# Patient Record
Sex: Female | Born: 1975
Health system: Southern US, Community
[De-identification: ages and names within clinical notes are randomized; demographics above are authoritative.]

## PROBLEM LIST (undated history)

## (undated) ENCOUNTER — Inpatient Hospital Stay (HOSPITAL_COMMUNITY): Payer: Self-pay

## (undated) DIAGNOSIS — J45909 Unspecified asthma, uncomplicated: Secondary | ICD-10-CM

## (undated) DIAGNOSIS — T4145XA Adverse effect of unspecified anesthetic, initial encounter: Secondary | ICD-10-CM

## (undated) DIAGNOSIS — D649 Anemia, unspecified: Secondary | ICD-10-CM

## (undated) DIAGNOSIS — B019 Varicella without complication: Secondary | ICD-10-CM

## (undated) DIAGNOSIS — Z9889 Other specified postprocedural states: Secondary | ICD-10-CM

## (undated) DIAGNOSIS — T8859XA Other complications of anesthesia, initial encounter: Secondary | ICD-10-CM

## (undated) DIAGNOSIS — R112 Nausea with vomiting, unspecified: Secondary | ICD-10-CM

## (undated) HISTORY — DX: Other specified postprocedural states: Z98.890

## (undated) HISTORY — PX: WISDOM TOOTH EXTRACTION: SHX21

## (undated) HISTORY — DX: Adverse effect of unspecified anesthetic, initial encounter: T41.45XA

## (undated) HISTORY — DX: Varicella without complication: B01.9

## (undated) HISTORY — PX: HERNIA REPAIR: SHX51

## (undated) HISTORY — DX: Anemia, unspecified: D64.9

## (undated) HISTORY — DX: Nausea with vomiting, unspecified: R11.2

## (undated) HISTORY — DX: Other complications of anesthesia, initial encounter: T88.59XA

---

## 2001-06-18 HISTORY — PX: OTHER SURGICAL HISTORY: SHX169

## 2006-06-18 HISTORY — PX: LAPAROSCOPIC GASTRIC BANDING: SHX1100

## 2012-05-27 ENCOUNTER — Encounter: Payer: Self-pay | Admitting: Internal Medicine

## 2012-05-27 ENCOUNTER — Ambulatory Visit (INDEPENDENT_AMBULATORY_CARE_PROVIDER_SITE_OTHER): Payer: PRIVATE HEALTH INSURANCE | Admitting: Internal Medicine

## 2012-05-27 VITALS — BP 118/78 | HR 63 | Temp 98.6°F | Resp 12 | Ht 66.5 in | Wt 170.8 lb

## 2012-05-27 DIAGNOSIS — L409 Psoriasis, unspecified: Secondary | ICD-10-CM

## 2012-05-27 DIAGNOSIS — Z131 Encounter for screening for diabetes mellitus: Secondary | ICD-10-CM

## 2012-05-27 DIAGNOSIS — R87619 Unspecified abnormal cytological findings in specimens from cervix uteri: Secondary | ICD-10-CM

## 2012-05-27 DIAGNOSIS — Z13220 Encounter for screening for lipoid disorders: Secondary | ICD-10-CM

## 2012-05-27 DIAGNOSIS — E663 Overweight: Secondary | ICD-10-CM

## 2012-05-27 DIAGNOSIS — Z6825 Body mass index (BMI) 25.0-25.9, adult: Secondary | ICD-10-CM

## 2012-05-27 DIAGNOSIS — L408 Other psoriasis: Secondary | ICD-10-CM

## 2012-05-27 DIAGNOSIS — L989 Disorder of the skin and subcutaneous tissue, unspecified: Secondary | ICD-10-CM

## 2012-05-27 MED ORDER — CALCIPOTRIENE-BETAMETH DIPROP 0.005-0.064 % EX SUSP: Freq: Every day | CUTANEOUS | Status: DC

## 2012-05-27 NOTE — Patient Instructions (Addendum)
This is  my version of a  "Low GI"  Diet:  All of the foods can be found at grocery stores and in bulk at [employer].  The Atkins protein bars and shakes are available in more varieties at [employer], [employer] and [employer].     7 AM Breakfast:  Low carbohydrate Protein  Shakes (I recommend the EAS AdvantEdge "Carb Control" shakes  Or the low carb shakes by Atkins.   Both are available everywhere:  In  cases at BJs  Or in 4 packs at grocery stores and pharmacies  2.5 carbs  (Alternative is  a toasted Arnold's Sandwhich Thin w/ peanut butter, a "Bagel Thin" with cream cheese and salmon) or  a scrambled egg burrito made with a low carb tortilla .  Avoid cereal and bananas, oatmeal too unless you are cooking the old fashioned kind that takes 30-40 minutes to prepare.  the rest is overly processed, has minimal fiber, and is loaded with carbohydrates!   10 AM: Protein bar by Atkins (the snack size, under 200 cal).  There are many varieties , available widely again or in bulk in limited varieties at BJs)  Other so called "protein bars" tend to be loaded with carbohydrates.  Remember, in food advertising, the word "energy" is synonymous for " carbohydrate."  Lunch: sandwich of turkey, (or any lunchmeat, grilled meat or canned tuna), fresh avocado, mayonnaise  and cheese on a lower carbohydrate pita bread, flatbread, or tortilla . Ok to use regular mayonnaise. The bread is the only source or carbohydrate that can be decreased (Joseph's makes a pita bread and a flat bread that are 50 cal and 4 net carbs ; Toufayan makes a low carb flatbread that's 100 cal and 9 net carbs  and  Mission makes a low carb whole wheat tortilla  That is 210 cal and 6 net carbs)  3 PM:  Mid day :  Another protein bar,  Or a  cheese stick (100 cal, 0 carbs),  Or 1 ounce of  almonds, walnuts, pistachios, pecans, peanuts,  Macadamia nuts. Or a Dannon light n Fit greek yogurt, 80 cal 8 net carbs . Avoid "granola"; the dried cranberries and  raisins are loaded with carbohydrates. Mixed nuts ok if no raisins or cranberries or dried fruit.      6 PM  Dinner:  "mean and green:"  Meat/chicken/fish or a high protein legume; , with a green salad, and a low GI  Veggie (broccoli, cauliflower, green beans, spinach, brussel sprouts. Lima beans) : Avoid "Low fat dressings, as well as [place] and [place]! They are loaded with sugar! Instead use ranch, vinagrette,  Blue cheese, etc.  There is a low carb pasta by Dreamfield's available at [employer] that is acceptable and tastes great. Try Michel Angel's chicken piccata over low carb pasta. The chicken dish is 0 carbs, and can be found in frozen section at BJs and [employer]. Also try Aaron Sanchez's "Carnitas" (pulled pork, no sauce,  0 carbs) and his pot roast.   both are in the refrigerated section at BJs   9 PM snack : Breyer's "low carb" fudgsicle or  ice cream bar (Carb Smart line), or  Weight Watcher's ice cream bar , or another "no sugar added" ice cream;a serving of fresh berries/cherries with whipped cream (Avoid bananas, pineapple, grapes  and watermelon on a regular basis because they are high in sugar)   Remember that snack Substitutions should be less than 15 to   20 carbs  Per serving. Remember to subtract fiber grams and sugar alcohols to get the "net carbs."   
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
## 2012-05-28 ENCOUNTER — Encounter: Payer: Self-pay | Admitting: Internal Medicine

## 2012-05-30 ENCOUNTER — Ambulatory Visit: Payer: Self-pay | Admitting: Internal Medicine

## 2012-06-01 ENCOUNTER — Encounter: Payer: Self-pay | Admitting: Internal Medicine

## 2012-06-01 DIAGNOSIS — Z13220 Encounter for screening for lipoid disorders: Secondary | ICD-10-CM | POA: Insufficient documentation

## 2012-06-01 DIAGNOSIS — Z131 Encounter for screening for diabetes mellitus: Secondary | ICD-10-CM | POA: Insufficient documentation

## 2012-06-01 DIAGNOSIS — L989 Disorder of the skin and subcutaneous tissue, unspecified: Secondary | ICD-10-CM | POA: Insufficient documentation

## 2012-06-01 DIAGNOSIS — E6609 Other obesity due to excess calories: Secondary | ICD-10-CM | POA: Insufficient documentation

## 2012-06-01 DIAGNOSIS — R87619 Unspecified abnormal cytological findings in specimens from cervix uteri: Secondary | ICD-10-CM | POA: Insufficient documentation

## 2012-06-01 LAB — HM PAP SMEAR: HM Pap smear: ABNORMAL

## 2012-06-01 NOTE — Assessment & Plan Note (Signed)
total chol 216, trig 69 HDL 81, LDL 121.  ALT 15 TSH 3.41 K 3.7 .

## 2012-06-01 NOTE — Assessment & Plan Note (Signed)
BUN Cr were 6/0.53  On no meds but a  MVI. A1c 5.7

## 2012-06-01 NOTE — Assessment & Plan Note (Signed)
I have addressed  BMI and recommended a low glycemic index diet utilizing smaller more frequent meals to increase metabolism.  I have also recommended that patient start exercising with a goal of 30 minutes of aerobic exercise a minimum of 5 days per week. Screening for lipid disorders, thyroid and diabetes has  been done  °

## 2012-06-01 NOTE — Assessment & Plan Note (Signed)
S/p normal colposcopy .  Repeat PA due in June 2014

## 2012-06-01 NOTE — Assessment & Plan Note (Signed)
AK vs early basal cell.  Referral to dermatology under way

## 2012-06-04 ENCOUNTER — Encounter: Payer: Self-pay | Admitting: Internal Medicine

## 2012-11-18 LAB — HM PAP SMEAR: HM Pap smear: NORMAL

## 2012-12-03 ENCOUNTER — Ambulatory Visit (INDEPENDENT_AMBULATORY_CARE_PROVIDER_SITE_OTHER): Payer: 59 | Admitting: Internal Medicine

## 2012-12-03 ENCOUNTER — Encounter: Payer: Self-pay | Admitting: Internal Medicine

## 2012-12-03 ENCOUNTER — Other Ambulatory Visit (HOSPITAL_COMMUNITY)
Admission: RE | Admit: 2012-12-03 | Discharge: 2012-12-03 | Disposition: A | Discharge status: Home / Self Care | Payer: 59 | Source: Ambulatory Visit | Attending: Internal Medicine | Admitting: Internal Medicine

## 2012-12-03 VITALS — BP 112/78 | HR 84 | Temp 98.4°F | Resp 16 | Wt 177.8 lb

## 2012-12-03 DIAGNOSIS — Z124 Encounter for screening for malignant neoplasm of cervix: Secondary | ICD-10-CM

## 2012-12-03 DIAGNOSIS — Z1151 Encounter for screening for human papillomavirus (HPV): Secondary | ICD-10-CM | POA: Insufficient documentation

## 2012-12-03 DIAGNOSIS — R87619 Unspecified abnormal cytological findings in specimens from cervix uteri: Secondary | ICD-10-CM

## 2012-12-03 DIAGNOSIS — Z01419 Encounter for gynecological examination (general) (routine) without abnormal findings: Secondary | ICD-10-CM | POA: Insufficient documentation

## 2012-12-03 MED ORDER — MUPIROCIN 2 % EX OINT: TOPICAL_OINTMENT | Freq: Three times a day (TID) | CUTANEOUS | Status: DC

## 2012-12-03 NOTE — Progress Notes (Signed)
Patient ID: Alicia Blair, female   DOB: 08/12/1975, 37 y.o.   MRN: [id number]   Patient Active Problem List   Diagnosis Date Noted  . Screening for lipoid disorders 06/01/2012  . Screening for diabetes mellitus 06/01/2012  . Skin lesion of face 06/01/2012  . Abnormal Pap smear of cervix 06/01/2012  . Overweight (BMI 25.0-29.9) 06/01/2012  . Psoriasis 05/27/2012    Subjective:  CC:   Chief Complaint  Patient presents with  . Follow-up    Needs pap    HPI:   Alicia Blair a 37 y.o. female who presents for 6 month follow up on abnormal PAP smear.  She had her  IUD removed in January and periods have been heavy but regular, lasting 7 days total.  She has not been sexually active .  Occasional use of diflucan for vaginitis due to candida.  symptoms of tingling/redness and irritation and itching which always resolve with one to two days of use.     She was referred to GYN for an abnormal PAP,  colposcopy was normal.  She is due for her next PAP smear in June 2014 .  Has one 72 yr old child by vaginal delivery pushed really hard,  Worried about future prolapse so she does Kegel exercises regularly. Baseline mammogram was normal in 2003, done early because of planned breast augmentation which was done without complications  afterward.     Past Medical History  Diagnosis Date  . Anemia   . Chicken pox     Past Surgical History  Procedure Laterality Date  . Appendectomy  1999  . Laparoscopic gastric banding  2008  . Breast augmentation  2003       The following portions of the patient's history were reviewed and updated as appropriate: Allergies, current medications, and problem list.    Review of Systems:   12 Pt  review of systems was negative except those addressed in the HPI,     History   Social History  . Marital Status: Single    Spouse Name: N/A    Number of Children: N/A  . Years of Education: N/A   Occupational History  . Not on file.   Social  History Main Topics  . Smoking status: Never Smoker   . Smokeless tobacco: Not on file  . Alcohol Use: Yes     Comment: social  . Drug Use: No  . Sexually Active: Not Currently   Other Topics Concern  . 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:  BP 112/78  Pulse 84  Temp(Src) 98.4 F (36.9 C) (Oral)  Resp 16  Wt 177 lb 12 oz (80.627 kg)  BMI 28.26 kg/m2  SpO2 99%  LMP 11/25/2012   General Appearance:    Alert, cooperative, no distress, appears stated age  Head:    Normocephalic, without obvious abnormality, atraumatic     Neck:   Supple, symmetrical, trachea midline, no adenopathy;    thyroid:  no enlargement/tenderness/nodules; no carotid   bruit or JVD  Lungs:     Clear to auscultation bilaterally, respirations unlabored   Heart:    Regular rate and rhythm, S1 and S2 normal, no murmur, rub   or gallop  Abdomen:     Soft, non-tender, bowel sounds active all four quadrants,    no masses, no organomegaly  Genitalia:    Pelvic: cervix normal in appearance, external genitalia normal, no  adnexal masses or tenderness, no cervical motion tenderness, rectovaginal septum normal, uterus normal size, shape, and consistency and vagina normal without discharge  Extremities:   Extremities normal, atraumatic, no cyanosis or edema  Pulses:   2+ and symmetric all extremities  Skin:   Skin color, texture, turgor normal, no rashes or lesions          Assessment and Plan:  Abnormal Pap smear of cervix Repeat done today .  PE normal    Updated Medication List Outpatient Encounter Prescriptions as of 12/03/2012  Medication Sig Dispense Refill  . calcipotriene-betamethasone (TACLONEX SCALP) external suspension Apply topically daily.  60 g  1  . Multiple Vitamin (MULTIVITAMIN) tablet Take 1 tablet by mouth daily.      . mupirocin ointment (BACTROBAN) 2 % Apply topically 3 (three) times daily.  22 g  0   No facility-administered encounter  medications on file as of 12/03/2012.     No orders of the defined types were placed in this encounter.    No Follow-up on file.

## 2012-12-03 NOTE — Patient Instructions (Addendum)
We will call you with the results of your PAP smear

## 2012-12-04 ENCOUNTER — Ambulatory Visit: Payer: PRIVATE HEALTH INSURANCE | Admitting: Internal Medicine

## 2012-12-06 ENCOUNTER — Encounter: Payer: Self-pay | Admitting: Internal Medicine

## 2012-12-06 NOTE — Assessment & Plan Note (Signed)
Repeat done today .  PE normal

## 2012-12-09 ENCOUNTER — Encounter: Payer: Self-pay | Admitting: *Deleted

## 2013-02-27 ENCOUNTER — Ambulatory Visit (INDEPENDENT_AMBULATORY_CARE_PROVIDER_SITE_OTHER): Payer: 59 | Admitting: Adult Health

## 2013-02-27 ENCOUNTER — Encounter: Payer: Self-pay | Admitting: Adult Health

## 2013-02-27 VITALS — BP 122/76 | HR 85 | Temp 98.3°F | Resp 12 | Ht 66.5 in | Wt 175.5 lb

## 2013-02-27 DIAGNOSIS — N9089 Other specified noninflammatory disorders of vulva and perineum: Secondary | ICD-10-CM

## 2013-02-27 MED ORDER — NYSTATIN-TRIAMCINOLONE 100000-0.1 UNIT/GM-% EX OINT: TOPICAL_OINTMENT | Freq: Two times a day (BID) | CUTANEOUS | Status: DC

## 2013-02-27 NOTE — Assessment & Plan Note (Signed)
Minimal redness observed on vulva. Sebaceous cysts resolved. There is small amount of discharge that is yellowish in color at the cervix. Culture sent. Vaginal walls normal, pink. Suspect her irritation may be coming from scented and flavored lubricants which are known culprits. Start nystatin-triamcinolone ointment to irritated area twice daily x 7 days. Adjust treatment as needed based on results of culture. If symptoms do not improve she will RTC.

## 2013-02-27 NOTE — Patient Instructions (Addendum)
  Apply nystatin-triamcinolone ointment to affected area  twice daily.  I will call you once we get the results of the culture.

## 2013-02-27 NOTE — Addendum Note (Signed)
Addended by: Montine Circle D on: 02/27/2013 10:29 AM   Modules accepted: Orders

## 2013-02-27 NOTE — Progress Notes (Signed)
  Subjective:    Patient ID: Alicia Blair, female    DOB: 18-Sep-1975, 37 y.o.   MRN: [id number]  HPI  Patient is a pleasant 37 y/o female who presents to clinic with c/o of feeling "uncomfortable" around her vaginal area. She reports redness and irritated inside labia minora. She had a sebaceous cyst approximately 1 week ago which she treated by applying warm compresses and then she notice it came to a head so she popped the cysts. There was white drainage from the cyst when she popped it. She reports this is what her GYN in [place] would do. She has had 2 cysts in the past. She uses condoms but reports has been using them without experiencing these symptoms. She occasionally uses scented, flavored lubrication prior to engaging in sex.  Current Outpatient Prescriptions on File Prior to Visit  Medication Sig Dispense Refill  . calcipotriene-betamethasone (TACLONEX SCALP) external suspension Apply topically daily.  60 g  1  . Multiple Vitamin (MULTIVITAMIN) tablet Take 1 tablet by mouth daily.      . mupirocin ointment (BACTROBAN) 2 % Apply topically 3 (three) times daily.  22 g  0   No current facility-administered medications on file prior to visit.     Review of Systems  Genitourinary: Negative for vaginal bleeding, vaginal discharge, vaginal pain and dyspareunia.       Irritation and discomfort around labia       Objective:   Physical Exam  Constitutional: She appears well-developed and well-nourished. No distress.  Genitourinary:    Vaginal discharge found.      BP 122/76  Pulse 85  Temp(Src) 98.3 F (36.8 C) (Oral)  Resp 12  Ht 5' 6.5" (1.689 m)  Wt 175 lb 8 oz (79.606 kg)  BMI 27.91 kg/m2  SpO2 98%     Assessment & Plan:

## 2013-02-28 LAB — WET PREP BY MOLECULAR PROBE: Candida species: NEGATIVE

## 2013-07-10 ENCOUNTER — Encounter (INDEPENDENT_AMBULATORY_CARE_PROVIDER_SITE_OTHER): Payer: Self-pay

## 2013-07-10 ENCOUNTER — Encounter: Payer: Self-pay | Admitting: Internal Medicine

## 2013-07-10 ENCOUNTER — Ambulatory Visit (INDEPENDENT_AMBULATORY_CARE_PROVIDER_SITE_OTHER): Payer: 59 | Admitting: Internal Medicine

## 2013-07-10 VITALS — BP 108/74 | HR 69 | Temp 98.1°F | Resp 16 | Wt 177.5 lb

## 2013-07-10 DIAGNOSIS — Z87898 Personal history of other specified conditions: Secondary | ICD-10-CM

## 2013-07-10 DIAGNOSIS — R6889 Other general symptoms and signs: Secondary | ICD-10-CM

## 2013-07-10 DIAGNOSIS — L408 Other psoriasis: Secondary | ICD-10-CM

## 2013-07-10 DIAGNOSIS — IMO0002 Reserved for concepts with insufficient information to code with codable children: Secondary | ICD-10-CM

## 2013-07-10 DIAGNOSIS — L409 Psoriasis, unspecified: Secondary | ICD-10-CM

## 2013-07-10 MED ORDER — DIAZEPAM 5 MG PO TABS: 5.0000 mg | ORAL_TABLET | Freq: Four times a day (QID) | ORAL | Status: DC | PRN

## 2013-07-10 MED ORDER — CLOBETASOL PROPIONATE 0.05 % EX SOLN: 1.0000 "application " | Freq: Two times a day (BID) | CUTANEOUS | Status: DC | PRN

## 2013-07-10 MED ORDER — FLUOCINOLONE ACETONIDE 0.01 % OT OIL: 1.0000 "application " | TOPICAL_OIL | Freq: Every day | OTIC | Status: DC | PRN

## 2013-07-10 MED ORDER — ONDANSETRON HCL 4 MG PO TABS: 4.0000 mg | ORAL_TABLET | Freq: Three times a day (TID) | ORAL | Status: DC | PRN

## 2013-07-10 NOTE — Progress Notes (Signed)
Pre-visit discussion using our clinic review tool. No additional management support is needed unless otherwise documented below in the visit note.  

## 2013-07-10 NOTE — Progress Notes (Signed)
Patient ID: Alicia Blair, female   DOB: [birth year], 38 y.o.   MRN: [id number]  Patient Active Problem List   Diagnosis Date Noted  . ASCUS with positive high risk HPV 07/12/2013  . History of motion sickness 07/12/2013  . Irritation of vulva 02/27/2013  . Screening for lipoid disorders 06/01/2012  . Screening for diabetes mellitus 06/01/2012  . Skin lesion of face 06/01/2012  . Overweight (BMI 25.0-29.9) 06/01/2012  . Psoriasis 05/27/2012    Subjective:  CC:   Chief Complaint  Patient presents with  . Follow-up    medication    HPI:   Alicia Blair a 38 y.o. female who presentsfor f ollowup on chronic conditions including Psoriasis on scalp.  She is having a flare of psoriasis on scalp and requesting refills on the lotions that she has used in the past provided by prior dermatologic consult.   She is flying to [place] next months  and has a history of recurrent motion sickness during flights. Prior trials of scopolamine patches have made her symptoms worse. She has had good results with valium and phenergan and is requesting refills on those today. She has never tried Zofran and is interested in using something that is less sedating than phenergan since  she wants to enjoy her brief vacation with a cocktail or she gets there.    She's also inquiring about when her  next PAP smear is due.  She has a history of a ASCUS on routien PAP smear Oct 2013, with HPV high-risk positive at that time. underwent colposcopy December 2013 which was benign. Repeat Pap smear by me in June  2014 was normal and the HPV screen was negative.   Past Medical History  Diagnosis Date  . Anemia   . Chicken pox     Past Surgical History  Procedure Laterality Date  . Appendectomy  1999  . Laparoscopic gastric banding  2008  . Breast augmentation  2003       The following portions of the patient's history were reviewed and updated as appropriate: Allergies, current medications, and problem  list.    Review of Systems:   12 Pt  review of systems was negative except those addressed in the HPI,     History   Social History  . Marital Status: Single    Spouse Name: N/A    Number of Children: N/A  . Years of Education: N/A   Occupational History  . Not on file.   Social History Main Topics  . Smoking status: Never Smoker   . Smokeless tobacco: Not on file  . Alcohol Use: Yes     Comment: social  . Drug Use: No  . Sexual Activity: Not Currently   Other Topics Concern  . 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 ROM normal. No CVA tenderness. Lungs: clear to auscultation bilaterally Heart: regular rate and rhythm, S1, S2 normal, no murmur, click, rub or gallop Abdomen: soft, non-tender; bowel sounds normal; no masses,  no organomegaly Pulses: 2+ and symmetric Skin: Skin color, texture, turgor normal. No rashes or lesions Lymph nodes: Cervical, supraclavicular, and axillary nodes normal.  Assessment and Plan:  ASCUS with positive high risk HPV Endocervical biopsy was negative/benign December 2013. Repeat Pap smear and HPV screen was normal June 2014 we'll continue annual Pap smear with HPV screen for 3 years and then resume 3 year intervals if continually normal   Psoriasis Refills on her anti-psoriasis lotions and creams were given to her today.  History of motion sickness She was given a prescription for Vicodin and Zofran to use her air travel.  A total of 25 minutes  of face to face time was spent with patient more than half of which was spent in counselling and coordination of care   Updated Medication List Outpatient Encounter Prescriptions as of 07/10/2013  Medication Sig  . calcipotriene-betamethasone (TACLONEX SCALP) external suspension Apply topically daily.  . clobetasol (TEMOVATE) 0.05 % external solution Apply 1 application topically 2 (two) times daily as needed.  . Fluocinolone Acetonide 0.01 % OIL Apply 1 application topically daily as needed.  . Multiple Vitamin (MULTIVITAMIN) tablet Take 1 tablet by mouth daily.  . mupirocin ointment (BACTROBAN) 2 % Apply topically 3 (three) times daily.  Marland Kitchen nystatin-triamcinolone ointment (MYCOLOG) Apply topically 2 (two) times daily.  . [DISCONTINUED] clobetasol (TEMOVATE) 0.05 % external solution Apply 1 application topically 2 (two) times daily as needed.  . [DISCONTINUED] Fluocinolone Acetonide 0.01 % OIL Apply 1 application topically daily as needed.  . diazepam (VALIUM) 5 MG tablet Take 1 tablet (5 mg total) by mouth every 6 (six) hours as needed. For air sickness  . ondansetron (ZOFRAN) 4 MG tablet Take 1 tablet (4 mg total) by mouth every 8 (eight) hours as needed for nausea or vomiting.  . [DISCONTINUED] diazepam (VALIUM) 5 MG tablet Take 1 tablet (5 mg total) by mouth every 6 (six) hours as needed for anxiety.     No orders of the defined types were placed in this encounter.    No Follow-up on file.

## 2013-07-10 NOTE — Patient Instructions (Signed)
Refills on the valium for air sickness  Try zofran instead of phenergan unless $$$$$  I will research the data on repeat testing frrequency for PAP smear and let you know

## 2013-07-12 ENCOUNTER — Telehealth: Payer: Self-pay | Admitting: Internal Medicine

## 2013-07-12 DIAGNOSIS — IMO0002 Reserved for concepts with insufficient information to code with codable children: Secondary | ICD-10-CM | POA: Insufficient documentation

## 2013-07-12 DIAGNOSIS — Z87898 Personal history of other specified conditions: Secondary | ICD-10-CM | POA: Insufficient documentation

## 2013-07-12 NOTE — Telephone Encounter (Signed)
She had inquired during her visit when her next Pap smear is due. She is due in June 2015.

## 2013-07-12 NOTE — Assessment & Plan Note (Signed)
Refills on her anti-psoriasis lotions and creams were given to her today.

## 2013-07-12 NOTE — Assessment & Plan Note (Signed)
She was given a prescription for Vicodin and Zofran to use her air travel.

## 2013-07-12 NOTE — Assessment & Plan Note (Signed)
Endocervical biopsy was negative/benign December 2013. Repeat Pap smear and HPV screen was normal June 2014 we'll continue annual Pap smear with HPV screen for 3 years and then resume 3 year intervals if continually normal

## 2013-07-14 NOTE — Telephone Encounter (Signed)
Left message for pt to call back  °

## 2013-07-17 NOTE — Telephone Encounter (Signed)
Left message on voicemail to call office. Pap smear is due June 2015.

## 2013-12-11 ENCOUNTER — Encounter: Payer: 59 | Admitting: Internal Medicine

## 2014-01-18 ENCOUNTER — Ambulatory Visit (INDEPENDENT_AMBULATORY_CARE_PROVIDER_SITE_OTHER): Payer: 59 | Admitting: Internal Medicine

## 2014-01-18 ENCOUNTER — Encounter: Payer: Self-pay | Admitting: Internal Medicine

## 2014-01-18 ENCOUNTER — Other Ambulatory Visit (HOSPITAL_COMMUNITY)
Admission: RE | Admit: 2014-01-18 | Discharge: 2014-01-18 | Disposition: A | Discharge status: Home / Self Care | Payer: 59 | Source: Ambulatory Visit | Attending: Internal Medicine | Admitting: Internal Medicine

## 2014-01-18 VITALS — BP 120/88 | HR 87 | Temp 98.9°F | Resp 16 | Ht 66.5 in | Wt 169.5 lb

## 2014-01-18 DIAGNOSIS — Z1151 Encounter for screening for human papillomavirus (HPV): Secondary | ICD-10-CM | POA: Insufficient documentation

## 2014-01-18 DIAGNOSIS — Z124 Encounter for screening for malignant neoplasm of cervix: Secondary | ICD-10-CM

## 2014-01-18 DIAGNOSIS — W461XXA Contact with contaminated hypodermic needle, initial encounter: Secondary | ICD-10-CM | POA: Insufficient documentation

## 2014-01-18 DIAGNOSIS — Z01419 Encounter for gynecological examination (general) (routine) without abnormal findings: Secondary | ICD-10-CM | POA: Insufficient documentation

## 2014-01-18 DIAGNOSIS — Z7729 Contact with and (suspected ) exposure to other hazardous substances: Secondary | ICD-10-CM

## 2014-01-18 DIAGNOSIS — R6889 Other general symptoms and signs: Secondary | ICD-10-CM

## 2014-01-18 DIAGNOSIS — N920 Excessive and frequent menstruation with regular cycle: Secondary | ICD-10-CM

## 2014-01-18 DIAGNOSIS — E663 Overweight: Secondary | ICD-10-CM

## 2014-01-18 DIAGNOSIS — W278XXA Contact with other nonpowered hand tool, initial encounter: Secondary | ICD-10-CM

## 2014-01-18 DIAGNOSIS — IMO0002 Reserved for concepts with insufficient information to code with codable children: Secondary | ICD-10-CM

## 2014-01-18 DIAGNOSIS — Z1239 Encounter for other screening for malignant neoplasm of breast: Secondary | ICD-10-CM

## 2014-01-18 DIAGNOSIS — Z0000 Encounter for general adult medical examination without abnormal findings: Secondary | ICD-10-CM | POA: Insufficient documentation

## 2014-01-18 NOTE — Assessment & Plan Note (Signed)
I have congratulated her in reduction of   BMI and encouraged  Continued weight loss with goal of 10% of body weigh over the next 6 months using a low glycemic index diet and regular exercise a minimum of 5 days per week.

## 2014-01-18 NOTE — Patient Instructions (Signed)
Congratulations on the weight loss!   You look fantastic  I recommend getting the majority of your calcium and Vitamin D  through diet rather than supplements given the recent association of calcium supplements with increased coronary artery calcium scores (You need 1200 mg daily )   Unsweetened almond/coconut milk is a great low calorie low carb, cholesterol free  way to increase your dietary calcium and vitamin D.  Try the blue Jackquline Bosch  Baseline screening mammogram to be ordered at [facility]

## 2014-01-18 NOTE — Assessment & Plan Note (Signed)
Repeat testing discussed.

## 2014-01-18 NOTE — Assessment & Plan Note (Signed)
Annual comprehensive exam was done including breast, pelvic and PAP smear. All screenings have been addressed .

## 2014-01-18 NOTE — Assessment & Plan Note (Signed)
PAP smear was done today

## 2014-01-18 NOTE — Assessment & Plan Note (Signed)
checking thyroid and CBC today

## 2014-01-18 NOTE — Progress Notes (Signed)
Subjective:     Alicia Blair is a 38 y.o. female and is here for a comprehensive physical exam. The patient reports no problems.  History   Social History  . Marital Status: Single    Spouse Name: N/A    Number of Children: N/A  . Years of Education: N/A   Occupational History  . Not on file.   Social History Main Topics  . Smoking status: Never Smoker   . Smokeless tobacco: Not on file  . Alcohol Use: 1.8 oz/week    3 Cans of beer per week     Comment: social  . Drug Use: No  . Sexual Activity: Not Currently   Other Topics Concern  . 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   Health Maintenance  Topic Date Due  . Tetanus/tdap  12/31/1994  . Influenza Vaccine  01/16/2014  . Pap Smear  12/04/2015    The following portions of the patient's history were reviewed and updated as appropriate: allergies, current medications, past family history, past medical history, past social history, past surgical history and problem list.  Review of Systems A comprehensive review of systems was negative.   Objective:   BP 120/88  Pulse 87  Temp(Src) 98.9 F (37.2 C) (Oral)  Resp 16  Ht 5' 6.5" (1.689 m)  Wt 169 lb 8 oz (76.885 kg)  BMI 26.95 kg/m2  SpO2 98%  LMP 12/28/2013   General Appearance:    Alert, cooperative, no distress, appears stated age  Head:    Normocephalic, without obvious abnormality, atraumatic  Eyes:    PERRL, conjunctiva/corneas clear, EOM's intact, fundi    benign, both eyes  Ears:    Normal TM's and external ear canals, both ears  Nose:   Nares normal, septum midline, mucosa normal, no drainage    or sinus tenderness  Throat:   Lips, mucosa, and tongue normal; teeth and gums normal  Neck:   Supple, symmetrical, trachea midline, no adenopathy;    thyroid:  no enlargement/tenderness/nodules; no carotid   bruit or JVD  Back:     Symmetric, no curvature, ROM normal, no CVA tenderness  Lungs:     Clear to auscultation  bilaterally, respirations unlabored  Chest Wall:    No tenderness or deformity   Heart:    Regular rate and rhythm, S1 and S2 normal, no murmur, rub   or gallop  Breast Exam:    Saline Implants, No tenderness, masses, or nipple abnormality  Abdomen:     Soft, non-tender, bowel sounds active all four quadrants,    no masses, no organomegaly  Genitalia:    Pelvic: cervix normal in appearance, external genitalia normal, no adnexal masses or tenderness, no cervical motion tenderness, rectovaginal septum normal, uterus normal size, shape, and consistency and vagina normal without discharge  Extremities:   Extremities normal, atraumatic, no cyanosis or edema  Pulses:   2+ and symmetric all extremities  Skin:   Skin color, texture, turgor normal, no rashes or lesions  Lymph nodes:   Cervical, supraclavicular, and axillary nodes normal  Neurologic:   CNII-XII intact, normal strength, sensation and reflexes    throughout    Assessment and Plan:   Overweight (BMI 25.0-29.9) I have congratulated her in reduction of   BMI and encouraged  Continued weight loss with goal of 10% of body weigh over the next 6 months using a low glycemic index diet and regular exercise a  minimum of 5 days per week.    ASCUS with positive high risk HPV PAP smear was done today  Needlestick injury accident with exposure to body fluid Repeat testing discussed.   Menorrhagia checking thyroid and CBC today   Routine general medical examination at a health care facility Annual comprehensive exam was done including breast, pelvic and PAP smear. All screenings have been addressed .    Updated Medication List Outpatient Encounter Prescriptions as of 01/18/2014  Medication Sig  . calcipotriene-betamethasone (TACLONEX SCALP) external suspension Apply topically daily.  . clobetasol (TEMOVATE) 0.05 % external solution Apply 1 application topically 2 (two) times daily as needed.  . Fluocinolone Acetonide 0.01 % OIL Apply 1  application topically daily as needed.  . Multiple Vitamin (MULTIVITAMIN) tablet Take 1 tablet by mouth daily.  . mupirocin ointment (BACTROBAN) 2 % Apply topically 3 (three) times daily.  Marland Kitchen nystatin-triamcinolone ointment (MYCOLOG) Apply topically 2 (two) times daily.  . diazepam (VALIUM) 5 MG tablet Take 1 tablet (5 mg total) by mouth every 6 (six) hours as needed. For air sickness  . ondansetron (ZOFRAN) 4 MG tablet Take 1 tablet (4 mg total) by mouth every 8 (eight) hours as needed for nausea or vomiting.

## 2014-01-18 NOTE — Progress Notes (Signed)
Pre-visit discussion using our clinic review tool. No additional management support is needed unless otherwise documented below in the visit note.  

## 2014-01-20 LAB — CYTOLOGY - PAP

## 2014-01-21 ENCOUNTER — Encounter: Payer: Self-pay | Admitting: *Deleted

## 2014-02-02 ENCOUNTER — Telehealth: Payer: Self-pay | Admitting: Internal Medicine

## 2014-02-02 DIAGNOSIS — L409 Psoriasis, unspecified: Secondary | ICD-10-CM

## 2014-02-02 MED ORDER — FLUOCINOLONE ACETONIDE 0.01 % OT OIL: 1.0000 "application " | TOPICAL_OIL | Freq: Every day | OTIC | Status: DC | PRN

## 2014-02-02 MED ORDER — CLOBETASOL PROPIONATE 0.05 % EX SOLN: 1.0000 "application " | Freq: Two times a day (BID) | CUTANEOUS | Status: DC | PRN

## 2014-02-02 MED ORDER — MUPIROCIN 2 % EX OINT: TOPICAL_OINTMENT | Freq: Three times a day (TID) | CUTANEOUS | Status: DC

## 2014-02-02 MED ORDER — CALCIPOTRIENE-BETAMETH DIPROP 0.005-0.064 % EX SUSP: Freq: Every day | CUTANEOUS | Status: DC

## 2014-02-02 NOTE — Addendum Note (Signed)
Addended by: Nanci Pina on: 02/02/2014 10:32 AM   Modules accepted: Orders

## 2014-02-02 NOTE — Telephone Encounter (Signed)
Refills for all meds for scalp sent to [facility] employee pharmacy.

## 2014-02-05 ENCOUNTER — Telehealth: Payer: Self-pay | Admitting: *Deleted

## 2014-02-05 ENCOUNTER — Other Ambulatory Visit: Payer: Self-pay | Admitting: Internal Medicine

## 2014-02-05 LAB — COMPREHENSIVE METABOLIC PANEL
ALT: 20 U/L
Albumin: 3.8 g/dL (ref 3.4–5.0)
Alkaline Phosphatase: 32 U/L — ABNORMAL LOW
Anion Gap: 6 — ABNORMAL LOW (ref 7–16)
BILIRUBIN TOTAL: 0.4 mg/dL (ref 0.2–1.0)
BUN: 10 mg/dL (ref 7–18)
Calcium, Total: 8.7 mg/dL (ref 8.5–10.1)
Chloride: 105 mmol/L (ref 98–107)
Co2: 30 mmol/L (ref 21–32)
Creatinine: 0.7 mg/dL (ref 0.60–1.30)
EGFR (African American): >60
EGFR (Non-African Amer.): >60
GLUCOSE: 94 mg/dL (ref 65–99)
Osmolality: 280 (ref 275–301)
POTASSIUM: 3.9 mmol/L (ref 3.5–5.1)
SGOT(AST): 18 U/L (ref 15–37)
SODIUM: 141 mmol/L (ref 136–145)
TOTAL PROTEIN: 7.1 g/dL (ref 6.4–8.2)

## 2014-02-05 LAB — CBC WITH DIFFERENTIAL/PLATELET
BASOS PCT: 0.9 %
Basophil #: 0 10*3/uL (ref 0.0–0.1)
Eosinophil #: 0.2 10*3/uL (ref 0.0–0.7)
Eosinophil %: 3.4 %
HCT: 39.7 % (ref 35.0–47.0)
HGB: 12.9 g/dL (ref 12.0–16.0)
Lymphocyte #: 1.6 10*3/uL (ref 1.0–3.6)
Lymphocyte %: 33.7 %
MCH: 30.2 pg (ref 26.0–34.0)
MCHC: 32.5 g/dL (ref 32.0–36.0)
MCV: 93 fL (ref 80–100)
MONO ABS: 0.5 x10 3/mm (ref 0.2–0.9)
Monocyte %: 11 %
NEUTROS ABS: 2.4 10*3/uL (ref 1.4–6.5)
Neutrophil %: 51 %
PLATELETS: 224 10*3/uL (ref 150–440)
RBC: 4.27 10*6/uL (ref 3.80–5.20)
RDW: 13.6 % (ref 11.5–14.5)
WBC: 4.7 10*3/uL (ref 3.6–11.0)

## 2014-02-05 LAB — LIPID PANEL
Cholesterol: 191 mg/dL (ref 0–200)
HDL Cholesterol: 83 mg/dL — ABNORMAL HIGH (ref 40–60)
LDL CHOLESTEROL, CALC: 100 mg/dL (ref 0–100)
Triglycerides: 40 mg/dL (ref 0–200)
VLDL CHOLESTEROL, CALC: 8 mg/dL (ref 5–40)

## 2014-02-05 LAB — TSH: THYROID STIMULATING HORM: 2.75 u[IU]/mL

## 2014-02-05 NOTE — Telephone Encounter (Signed)
Sonia Baller from [facility] called states the pt was to have a CBC ordered.  Review of Office note from 8.3.15 CBC was to be ordered.  (printed for your review.)  Sonia Baller is requesting an order for CBC be faxed to [phone].  Please advise in Dr Lupita Dawn absence

## 2014-02-05 NOTE — Telephone Encounter (Signed)
Order faxed to [phone]

## 2014-02-05 NOTE — Telephone Encounter (Signed)
Fine to order CBC

## 2014-02-11 ENCOUNTER — Telehealth: Payer: Self-pay | Admitting: Internal Medicine

## 2014-02-11 NOTE — Telephone Encounter (Signed)
Your , thyroid , cholesterol, liver and kidney function are normal.

## 2014-02-11 NOTE — Telephone Encounter (Signed)
Patient notified of results and voiced understanding.

## 2014-04-28 ENCOUNTER — Encounter: Payer: Self-pay | Admitting: Internal Medicine

## 2014-04-29 ENCOUNTER — Encounter: Payer: Self-pay | Admitting: Internal Medicine

## 2014-04-29 ENCOUNTER — Ambulatory Visit (INDEPENDENT_AMBULATORY_CARE_PROVIDER_SITE_OTHER): Payer: 59 | Admitting: Internal Medicine

## 2014-04-29 VITALS — BP 112/74 | HR 85 | Temp 98.4°F | Wt 175.0 lb

## 2014-04-29 DIAGNOSIS — B379 Candidiasis, unspecified: Secondary | ICD-10-CM

## 2014-04-29 DIAGNOSIS — B9689 Other specified bacterial agents as the cause of diseases classified elsewhere: Secondary | ICD-10-CM

## 2014-04-29 DIAGNOSIS — N760 Acute vaginitis: Secondary | ICD-10-CM

## 2014-04-29 DIAGNOSIS — A499 Bacterial infection, unspecified: Secondary | ICD-10-CM

## 2014-04-29 DIAGNOSIS — T3695XA Adverse effect of unspecified systemic antibiotic, initial encounter: Secondary | ICD-10-CM

## 2014-04-29 MED ORDER — FLUCONAZOLE 150 MG PO TABS: 150.0000 mg | ORAL_TABLET | Freq: Once | ORAL | Status: DC

## 2014-04-29 MED ORDER — METRONIDAZOLE 0.75 % EX GEL: 1.0000 "application " | Freq: Two times a day (BID) | CUTANEOUS | Status: DC

## 2014-04-29 NOTE — Patient Instructions (Signed)
Bacterial Vaginosis Bacterial vaginosis is a vaginal infection that occurs when the normal balance of bacteria in the vagina is disrupted. It results from an overgrowth of certain bacteria. This is the most common vaginal infection in women of childbearing age. Treatment is important to prevent complications, especially in pregnant women, as it can cause a premature delivery. CAUSES  Bacterial vaginosis is caused by an increase in harmful bacteria that are normally present in smaller amounts in the vagina. Several different kinds of bacteria can cause bacterial vaginosis. However, the reason that the condition develops is not fully understood. RISK FACTORS Certain activities or behaviors can put you at an increased risk of developing bacterial vaginosis, including:  Having a new sex partner or multiple sex partners.  Douching.  Using an intrauterine device (IUD) for contraception. Women do not get bacterial vaginosis from toilet seats, bedding, swimming pools, or contact with objects around them. SIGNS AND SYMPTOMS  Some women with bacterial vaginosis have no signs or symptoms. Common symptoms include:  Grey vaginal discharge.  A fishlike odor with discharge, especially after sexual intercourse.  Itching or burning of the vagina and vulva.  Burning or pain with urination. DIAGNOSIS  Your health care provider will take a medical history and examine the vagina for signs of bacterial vaginosis. A sample of vaginal fluid may be taken. Your health care provider will look at this sample under a microscope to check for bacteria and abnormal cells. A vaginal pH test may also be done.  TREATMENT  Bacterial vaginosis may be treated with antibiotic medicines. These may be given in the form of a pill or a vaginal cream. A second round of antibiotics may be prescribed if the condition comes back after treatment.  HOME CARE INSTRUCTIONS   Only take over-the-counter or prescription medicines as  directed by your health care provider.  If antibiotic medicine was prescribed, take it as directed. Make sure you finish it even if you start to feel better.  Do not have sex until treatment is completed.  Tell all sexual partners that you have a vaginal infection. They should see their health care provider and be treated if they have problems, such as a mild rash or itching.  Practice safe sex by using condoms and only having one sex partner. SEEK MEDICAL CARE IF:   Your symptoms are not improving after 3 days of treatment.  You have increased discharge or pain.  You have a fever. MAKE SURE YOU:   Understand these instructions.  Will watch your condition.  Will get help right away if you are not doing well or get worse. FOR MORE INFORMATION  Centers for Disease Control and Prevention, Division of STD Prevention: AppraiserFraud.fi American Sexual Health Association (ASHA): www.ashastd.org  Document Released: 06/04/2005 Document Revised: 03/25/2013 Document Reviewed: 01/14/2013 [facility] Patient Information 2015 [place], [place]. This information is not intended to replace advice given to you by your health care provider. Make sure you discuss any questions you have with your health care provider.

## 2014-04-29 NOTE — Progress Notes (Signed)
Pre visit review using our clinic review tool, if applicable. No additional management support is needed unless otherwise documented below in the visit note. 

## 2014-04-29 NOTE — Progress Notes (Signed)
Subjective:    Patient ID: LACHELL ROCHETTE, female    DOB: 04-25-1976, 38 y.o.   MRN: [id number]  HPI  Pt presents to the clinic today with c/o vaginal irritation. She reports this started 2 days ago. She reports that her area is red and inflamed.She denies vaginal discharge or odor. She denies urinary symptoms. She has tried Nystatin cream with some relief.  She does not douche. She has not changed soaps. She is sexually active.  Previous pap smear 11/2012 normal, HPV negative. Did have abnormal pap 03/2012. Was also HPV positive at that time. Colposcopy 05/2012 was normal.  Review of Systems      Past Medical History  Diagnosis Date  . Anemia   . Chicken pox     Current Outpatient Prescriptions  Medication Sig Dispense Refill  . calcipotriene-betamethasone (TACLONEX SCALP) external suspension Apply topically daily. 60 g 1  . clobetasol (TEMOVATE) 0.05 % external solution Apply 1 application topically 2 (two) times daily as needed. 50 mL 3  . diazepam (VALIUM) 5 MG tablet Take 1 tablet (5 mg total) by mouth every 6 (six) hours as needed. For air sickness 30 tablet 0  . Fluocinolone Acetonide 0.01 % OIL Apply 1 application topically daily as needed. 118 mL 3  . levonorgestrel (MIRENA) 20 MCG/24HR IUD 1 each by Intrauterine route once. Inserted 01/2014    . Multiple Vitamin (MULTIVITAMIN) tablet Take 1 tablet by mouth daily.    . mupirocin ointment (BACTROBAN) 2 % Apply topically 3 (three) times daily. 22 g 0  . nystatin-triamcinolone ointment (MYCOLOG) Apply topically 2 (two) times daily. 30 g 0  . ondansetron (ZOFRAN) 4 MG tablet Take 1 tablet (4 mg total) by mouth every 8 (eight) hours as needed for nausea or vomiting. 20 tablet 0   No current facility-administered medications for this visit.    Allergies  Allergen Reactions  . Codeine     Family History  Problem Relation Age of Onset  . Diabetes Mother   . Cancer Mother 81    endometrial CA  . Diabetes Maternal Aunt     . Diabetes Maternal Grandmother   . Cancer Brother 21    testicular ca    History   Social History  . Marital Status: Single    Spouse Name: N/A    Number of Children: N/A  . Years of Education: N/A   Occupational History  . Not on file.   Social History Main Topics  . Smoking status: Never Smoker   . Smokeless tobacco: Not on file  . Alcohol Use: 1.8 oz/week    3 Cans of beer per week     Comment: social  . Drug Use: No  . Sexual Activity: Not Currently   Other Topics Concern  . 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     Constitutional: Denies fever, malaise, fatigue, headache or abrupt weight changes.  Gastrointestinal: Denies abdominal pain, bloating, constipation, diarrhea or blood in the stool.  GU: Pt reports vaginal irritation. Denies urgency, frequency, pain with urination, burning sensation, blood in urine, odor or discharge.   No other specific complaints in a complete review of systems (except as listed in HPI above).  Objective:   Physical Exam    BP 112/74 mmHg  Pulse 85  Temp(Src) 98.4 F (36.9 C) (Oral)  Wt 175 lb (79.379 kg)  SpO2 99%  LMP 04/11/2014 Wt Readings from Last 3 Encounters:  04/29/14 175 lb (79.379 kg)  01/18/14 169 lb 8 oz (76.885 kg)  07/10/13 177 lb 8 oz (80.513 kg)    General: Appears her stated age, well developed, well nourished in NAD. Cardiovascular: Normal rate and rhythm. S1,S2 noted.  No murmur, rubs or gallops noted. Pulmonary/Chest: Normal effort and positive vesicular breath sounds. No respiratory distress. No wheezes, rales or ronchi noted.  Abdomen: Soft and nontender. Normal bowel sounds, no bruits noted. No distention or masses noted. Liver, spleen and kidneys non palpable. Pelvic: Normal female anatomy. No external irritation noted. Small amount of thins white discharge noted at the vaginal opening.    Assessment & Plan:   Bacterial Vaginosis:  Wet prep: + clue cells, + whiff,  no yeast eRx for Metrogel BID x 5 days She gets yeast infections with this treatment eRx for Diflucan for antibiotic induced yeast  RTC as needed or if symptoms persist or worsen

## 2014-05-07 ENCOUNTER — Ambulatory Visit (INDEPENDENT_AMBULATORY_CARE_PROVIDER_SITE_OTHER): Payer: 59 | Admitting: Internal Medicine

## 2014-05-07 ENCOUNTER — Encounter: Payer: Self-pay | Admitting: Internal Medicine

## 2014-05-07 ENCOUNTER — Telehealth: Payer: Self-pay | Admitting: Internal Medicine

## 2014-05-07 VITALS — BP 110/78 | HR 77 | Temp 97.9°F | Resp 16 | Ht 66.5 in | Wt 172.2 lb

## 2014-05-07 DIAGNOSIS — N760 Acute vaginitis: Secondary | ICD-10-CM | POA: Insufficient documentation

## 2014-05-07 NOTE — Telephone Encounter (Signed)
4:30 TODAY

## 2014-05-07 NOTE — Telephone Encounter (Signed)
Patient aware 4.30 please

## 2014-05-07 NOTE — Progress Notes (Signed)
Patient ID: Alicia Blair, female   DOB: 1975-11-27, 38 y.o.   MRN: [id number]  Patient Active Problem List   Diagnosis Date Noted  . Vaginitis and vulvovaginitis 05/07/2014  . Needlestick injury accident with exposure to body fluid 01/18/2014  . Menorrhagia 01/18/2014  . Routine general medical examination at a health care facility 01/18/2014  . ASCUS with positive high risk HPV 07/12/2013  . History of motion sickness 07/12/2013  . Irritation of vulva 02/27/2013  . Screening for lipoid disorders 06/01/2012  . Screening for diabetes mellitus 06/01/2012  . Skin lesion of face 06/01/2012  . Overweight (BMI 25.0-29.9) 06/01/2012  . Psoriasis 05/27/2012    Subjective:  CC:   Chief Complaint  Patient presents with  . Acute Visit    Vaginosis     HPI:   Alicia Blair is a 38 y.o. female who presents for  Persistent vaginal discharge.  Patient was treated by NP Baity on 22/14 with metrogel for BV.  Symptoms including mild pruritis and copious amounts of white thick discharge has persisted and she was worked in today for repeat evaluation.  Denies pelvic pain,  Fevers,  And is in a monogamous relationship with husband,  Does not douche.    Past Medical History  Diagnosis Date  . Anemia   . Chicken pox     Past Surgical History  Procedure Laterality Date  . Appendectomy  1999  . Laparoscopic gastric banding  2008  . Breast augmentation  2003       The following portions of the patient's history were reviewed and updated as appropriate: Allergies, current medications, and problem list.    Review of Systems:   Patient denies headache, fevers, malaise, unintentional weight loss, skin rash, eye pain, sinus congestion and sinus pain, sore throat, dysphagia,  hemoptysis , cough, dyspnea, wheezing, chest pain, palpitations, orthopnea, edema, abdominal pain, nausea, melena, diarrhea, constipation, flank pain, dysuria, hematuria, urinary  Frequency, nocturia, numbness,  tingling, seizures,  Focal weakness, Loss of consciousness,  Tremor, insomnia, depression, anxiety, and suicidal ideation.     History   Social History  . Marital Status: Single    Spouse Name: N/A    Number of Children: N/A  . Years of Education: N/A   Occupational History  . Not on file.   Social History Main Topics  . Smoking status: Never Smoker   . Smokeless tobacco: Not on file  . Alcohol Use: 1.8 oz/week    3 Cans of beer per week     Comment: social  . Drug Use: No  . Sexual Activity: Not Currently   Other Topics Concern  . 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:  Filed Vitals:   05/07/14 1651  BP: 110/78  Pulse: 77  Temp: 97.9 F (36.6 C)  Resp: 16   General Appearance:    Alert, cooperative, no distress, appears stated age     Neck:   Supple, symmetrical, trachea midline, no adenopathy;    thyroid:  no enlargement/tenderness/nodules; no carotid   bruit or JVD  Back:     Symmetric, no curvature, ROM normal, no CVA tenderness  Lungs:     Clear to auscultation bilaterally, respirations unlabored     Abdomen:     Soft, non-tender, bowel sounds active all four quadrants,    no masses, no organomegaly  Genitalia:    Pelvic: cervix normal in appearance, external genitalia  normal, no adnexal masses or tenderness, no cervical motion tenderness, rectovaginal septum normal, uterus normal size, shape, and consistency .  copious amounts of thick white curdish discharge with minimal vaginal irritation noted.   Extremities:   Extremities normal, atraumatic, no cyanosis or edema  Pulses:   2+ and symmetric all extremities  Skin:   Skin color, texture, turgor normal, no rashes or lesions  Lymph nodes:   Cervical, supraclavicular, and axillary nodes normal  Neurologic:   CNII-XII intact, normal strength, sensation and reflexes    throughout    Assessment and Plan:  Vaginitis and vulvovaginitis Repeat pelvic exam was done with  copious amounts of thick white discharge noted. She was treated empirically for candida with oral fluconazole,  Which was changed to oral metronidazole 500 mg bid x 7 days when the wet prep results shoed no evidence of candida but persistent BV.  Patient was notified by phone on Sunday by MD and rx for  Oral flagyl 500 mg bid x 7 days sent to [facility].  Other studies are still pending.   A total of 25 minutes of face to face time was spent with patient more than half of which was spent in counselling and coordination of care    Updated Medication List Outpatient Encounter Prescriptions as of 05/07/2014  Medication Sig  . calcipotriene-betamethasone (TACLONEX SCALP) external suspension Apply topically daily.  . clobetasol (TEMOVATE) 0.05 % external solution Apply 1 application topically 2 (two) times daily as needed.  . diazepam (VALIUM) 5 MG tablet Take 1 tablet (5 mg total) by mouth every 6 (six) hours as needed. For air sickness  . fluconazole (DIFLUCAN) 150 MG tablet Take 1 tablet (150 mg total) by mouth once.  . Fluocinolone Acetonide 0.01 % OIL Apply 1 application topically daily as needed.  Marland Kitchen levonorgestrel (MIRENA) 20 MCG/24HR IUD 1 each by Intrauterine route once. Inserted 01/2014  . metroNIDAZOLE (METROGEL) 0.75 % gel Apply 1 application topically 2 (two) times daily.  . Multiple Vitamin (MULTIVITAMIN) tablet Take 1 tablet by mouth daily.  . mupirocin ointment (BACTROBAN) 2 % Apply topically 3 (three) times daily.  Marland Kitchen nystatin-triamcinolone ointment (MYCOLOG) Apply topically 2 (two) times daily.  . ondansetron (ZOFRAN) 4 MG tablet Take 1 tablet (4 mg total) by mouth every 8 (eight) hours as needed for nausea or vomiting.  . metroNIDAZOLE (FLAGYL) 500 MG tablet Take 1 tablet (500 mg total) by mouth 2 (two) times daily.     Orders Placed This Encounter  Procedures  . Culture, routine-genital  . WET PREP FOR [place], YEAST, CLUE  . GC/Chlamydia Probe Amp  . WET PREP BY MOLECULAR PROBE  .  GC/chlamydia probe amp, urine    No Follow-up on file.

## 2014-05-07 NOTE — Telephone Encounter (Signed)
Please advise. Patient was DX with bacterial vaginosis 04/29/14 at [place] and is worse today with excessive discharge and some bleeding. Patient does not get off work until 3 PM

## 2014-05-07 NOTE — Telephone Encounter (Signed)
The patient has been scheduled

## 2014-05-07 NOTE — Telephone Encounter (Signed)
Please advise 

## 2014-05-07 NOTE — Telephone Encounter (Signed)
[name] called saying she's having a number of issues vaginally. She saw Webb Silversmith at [facility] on [address] and since then, her symptoms have worsened. She has discharge, irritation, and now sees dried blood in her panties etc. She would like to be seen here if possible. She doesn't get off until 3 which is why she didn't take the appts C. Doss has for today. I scheduled her again at [facility] on Monday since there weren't any openings here. She's extremely concerned about what's going on and the fact that after having taken medication, it's steadily getting worse. Please call the pt. Pt ph# [phone] Thank you.

## 2014-05-07 NOTE — Progress Notes (Signed)
Pre-visit discussion using our clinic review tool. No additional management support is needed unless otherwise documented below in the visit note.  

## 2014-05-07 NOTE — Patient Instructions (Signed)
Take diflucan for 4 days .

## 2014-05-08 LAB — WET PREP BY MOLECULAR PROBE
Candida species: NEGATIVE
Gardnerella vaginalis: POSITIVE — AB
TRICHOMONAS VAG: NEGATIVE

## 2014-05-09 MED ORDER — METRONIDAZOLE 500 MG PO TABS: 500.0000 mg | ORAL_TABLET | Freq: Two times a day (BID) | ORAL | Status: DC

## 2014-05-09 NOTE — Assessment & Plan Note (Addendum)
Repeat pelvic exam was done with copious amounts of thick white discharge noted. She was treated empirically for candida with oral fluconazole,  Which was changed to oral metronidazole 500 mg bid x 7 days when the wet prep results shoed no evidence of candida but persistent BV.  Patient was notified by phone on Sunday by MD and rx for  Oral flagyl 500 mg bid x 7 days [facility].  Other studies are still pending.

## 2014-05-10 ENCOUNTER — Telehealth: Payer: Self-pay | Admitting: Internal Medicine

## 2014-05-10 ENCOUNTER — Ambulatory Visit: Payer: 59 | Admitting: Family Medicine

## 2014-05-10 NOTE — Telephone Encounter (Signed)
Patient did not come for their scheduled appointment today for discharge, irritation, and dried blood x's 2wks with Dr Glori Bickers.  Please let me know if the patient needs to be contacted immediately for follow up or if no follow up is necessary.

## 2014-05-10 NOTE — Telephone Encounter (Signed)
Does not need to be rescheduled,  Was seen on [place].

## 2014-05-11 LAB — GC/CHLAMYDIA PROBE AMP

## 2014-05-11 LAB — CULTURE, ROUTINE-GENITAL: ORGANISM ID, BACTERIA: NORMAL

## 2014-05-28 ENCOUNTER — Other Ambulatory Visit: Payer: Self-pay | Admitting: Internal Medicine

## 2014-05-28 ENCOUNTER — Encounter: Payer: Self-pay | Admitting: Internal Medicine

## 2014-05-28 ENCOUNTER — Ambulatory Visit (INDEPENDENT_AMBULATORY_CARE_PROVIDER_SITE_OTHER): Payer: 59 | Admitting: Internal Medicine

## 2014-05-28 VITALS — BP 104/68 | HR 86 | Temp 98.2°F | Resp 14 | Ht 66.5 in | Wt 176.2 lb

## 2014-05-28 DIAGNOSIS — N9089 Other specified noninflammatory disorders of vulva and perineum: Secondary | ICD-10-CM

## 2014-05-28 DIAGNOSIS — N760 Acute vaginitis: Secondary | ICD-10-CM

## 2014-05-28 NOTE — Progress Notes (Signed)
Patient ID: Alicia Blair, female   DOB: 05-02-1976, 38 y.o.   MRN: [id number]  Patient Active Problem List   Diagnosis Date Noted  . Vaginitis and vulvovaginitis 05/07/2014  . Needlestick injury accident with exposure to body fluid 01/18/2014  . Menorrhagia 01/18/2014  . Routine general medical examination at a health care facility 01/18/2014  . ASCUS with positive high risk HPV 07/12/2013  . History of motion sickness 07/12/2013  . Irritation of vulva 02/27/2013  . Screening for lipoid disorders 06/01/2012  . Screening for diabetes mellitus 06/01/2012  . Skin lesion of face 06/01/2012  . Overweight (BMI 25.0-29.9) 06/01/2012  . Psoriasis 05/27/2012    Subjective:  CC:   Chief Complaint  Patient presents with  . Annual Exam    HPI:   Alicia Blair is a 38 y.o. female who presents for RECHECK ON BACTERIAL VAGINOSIS.  Patient states that the discharge has resolved, but still feels  Vaginal irritation,  She treated a sebaceous cyst on her inner labia with warm compresses. .    Past Medical History  Diagnosis Date  . Anemia   . Chicken pox     Past Surgical History  Procedure Laterality Date  . Appendectomy  1999  . Laparoscopic gastric banding  2008  . Breast augmentation  2003       The following portions of the patient's history were reviewed and updated as appropriate: Allergies, current medications, and problem list.    Review of Systems:   Patient denies headache, fevers, malaise, unintentional weight loss, skin rash, eye pain, sinus congestion and sinus pain, sore throat, dysphagia,  hemoptysis , cough, dyspnea, wheezing, chest pain, palpitations, orthopnea, edema, abdominal pain, nausea, melena, diarrhea, constipation, flank pain, dysuria, hematuria, urinary  Frequency, nocturia, numbness, tingling, seizures,  Focal weakness, Loss of consciousness,  Tremor, insomnia, depression, anxiety, and suicidal ideation.     History   Social History  . Marital  Status: Single    Spouse Name: N/A    Number of Children: N/A  . Years of Education: N/A   Occupational History  . Not on file.   Social History Main Topics  . Smoking status: Never Smoker   . Smokeless tobacco: Not on file  . Alcohol Use: 1.8 oz/week    3 Cans of beer per week     Comment: social  . Drug Use: No  . Sexual Activity: Not Currently   Other Topics Concern  . Not on file   Social History Narrative   RN in the OR at [employer] .    Objective:  Filed Vitals:   05/28/14 0901  BP: 104/68  Pulse: 86  Temp: 98.2 F (36.8 C)  Resp: 14   General Appearance:    Alert, cooperative, no distress, appears stated age  Head:    Normocephalic, without obvious abnormality, atraumatic  Abdomen:     Soft, non-tender, bowel sounds active all four quadrants,    no masses, no organomegaly  Genitalia:    Pelvic: cervix normal in appearance, external genitalia normal, no adnexal masses or tenderness, no cervical motion tenderness, rectovaginal septum normal, uterus normal size, shape, and consistency and vagina normal without discharge  Lymph nodes:   Cervical, supraclavicular, and axillary nodes normal    Assessment and Plan:  Problem List Items Addressed This Visit      Genitourinary   Vaginitis and vulvovaginitis - Primary    Repeat pelvic exam was done with resolution of the copious amounts of thick white  discharge that was noted last week secondary to gardnerella. She was treated empirically for candida with oral fluconazole,  Which was changed to oral metronidazole 500 mg bid x 7 days when the wet prep results were received.       Relevant Orders      WET PREP FOR TRICH, YEAST, CLUE      Culture, routine-genital     Other   Irritation of vulva    No vulvar irritation was noted,

## 2014-05-28 NOTE — Progress Notes (Signed)
Pre-visit discussion using our clinic review tool. No additional management support is needed unless otherwise documented below in the visit note.  

## 2014-05-28 NOTE — Patient Instructions (Signed)
We repeated your cultures today and I will notify you either way  We discussed referral to [facility],  Or Blima Rich at [facility] for possible referral Sharlett Iles is Urogyn)

## 2014-05-30 NOTE — Assessment & Plan Note (Signed)
Repeat pelvic exam was done with resolution of the copious amounts of thick white discharge that was noted last week secondary to gardnerella. She was treated empirically for candida with oral fluconazole,  Which was changed to oral metronidazole 500 mg bid x 7 days when the wet prep results were received.

## 2014-05-30 NOTE — Assessment & Plan Note (Signed)
No vulvar irritation was noted,

## 2014-05-31 LAB — WET PREP, GENITAL
Clue Cells Wet Prep HPF POC: NEGATIVE
Trich, Wet Prep: NEGATIVE
Yeast Wet Prep HPF POC: NEGATIVE

## 2014-05-31 LAB — WET PREP BY MOLECULAR PROBE
CANDIDA SPECIES: NEGATIVE
Gardnerella vaginalis: NEGATIVE
TRICHOMONAS VAG: NEGATIVE

## 2014-06-01 LAB — GC/CHLAMYDIA PROBE AMP
CT PROBE, AMP APTIMA: NEGATIVE
GC Probe RNA: NEGATIVE

## 2014-09-22 ENCOUNTER — Encounter: Payer: Self-pay | Admitting: *Deleted

## 2015-01-03 ENCOUNTER — Telehealth: Payer: Self-pay | Admitting: Internal Medicine

## 2015-01-03 DIAGNOSIS — Z113 Encounter for screening for infections with a predominantly sexual mode of transmission: Secondary | ICD-10-CM

## 2015-01-03 DIAGNOSIS — R5383 Other fatigue: Secondary | ICD-10-CM

## 2015-01-03 DIAGNOSIS — E785 Hyperlipidemia, unspecified: Secondary | ICD-10-CM

## 2015-01-03 NOTE — Telephone Encounter (Signed)
Please advise nay other labs needed.

## 2015-01-03 NOTE — Telephone Encounter (Signed)
Pt is requesting to have lab work done before her appt 8/25.Alicia Kitchenpt is also requesting these labs done  Hep C and HIV test

## 2015-01-04 NOTE — Telephone Encounter (Signed)
Yes, ok to make a fasting labs appt.  The labs have been ordered.

## 2015-01-05 NOTE — Telephone Encounter (Signed)
Lab appt made  For 8/11.Marland Kitchen

## 2015-01-13 ENCOUNTER — Ambulatory Visit (INDEPENDENT_AMBULATORY_CARE_PROVIDER_SITE_OTHER): Payer: 59 | Admitting: Nurse Practitioner

## 2015-01-13 ENCOUNTER — Encounter: Payer: Self-pay | Admitting: Nurse Practitioner

## 2015-01-13 VITALS — BP 108/88 | HR 81 | Temp 98.5°F | Resp 14 | Ht 67.0 in | Wt 177.6 lb

## 2015-01-13 DIAGNOSIS — N760 Acute vaginitis: Secondary | ICD-10-CM | POA: Diagnosis not present

## 2015-01-13 MED ORDER — METRONIDAZOLE 500 MG PO TABS: 500.0000 mg | ORAL_TABLET | Freq: Two times a day (BID) | ORAL | Status: DC

## 2015-01-13 NOTE — Patient Instructions (Signed)
Bacterial Vaginosis Bacterial vaginosis is a vaginal infection that occurs when the normal balance of bacteria in the vagina is disrupted. It results from an overgrowth of certain bacteria. This is the most common vaginal infection in women of childbearing age. Treatment is important to prevent complications, especially in pregnant women, as it can cause a premature delivery. CAUSES  Bacterial vaginosis is caused by an increase in harmful bacteria that are normally present in smaller amounts in the vagina. Several different kinds of bacteria can cause bacterial vaginosis. However, the reason that the condition develops is not fully understood. RISK FACTORS Certain activities or behaviors can put you at an increased risk of developing bacterial vaginosis, including:  Having a new sex partner or multiple sex partners.  Douching.  Using an intrauterine device (IUD) for contraception. Women do not get bacterial vaginosis from toilet seats, bedding, swimming pools, or contact with objects around them. SIGNS AND SYMPTOMS  Some women with bacterial vaginosis have no signs or symptoms. Common symptoms include:  Grey vaginal discharge.  A fishlike odor with discharge, especially after sexual intercourse.  Itching or burning of the vagina and vulva.  Burning or pain with urination. DIAGNOSIS  Your health care provider will take a medical history and examine the vagina for signs of bacterial vaginosis. A sample of vaginal fluid may be taken. Your health care provider will look at this sample under a microscope to check for bacteria and abnormal cells. A vaginal pH test may also be done.  TREATMENT  Bacterial vaginosis may be treated with antibiotic medicines. These may be given in the form of a pill or a vaginal cream. A second round of antibiotics may be prescribed if the condition comes back after treatment.  HOME CARE INSTRUCTIONS   Only take over-the-counter or prescription medicines as  directed by your health care provider.  If antibiotic medicine was prescribed, take it as directed. Make sure you finish it even if you start to feel better.  Do not have sex until treatment is completed.  Tell all sexual partners that you have a vaginal infection. They should see their health care provider and be treated if they have problems, such as a mild rash or itching.  Practice safe sex by using condoms and only having one sex partner. SEEK MEDICAL CARE IF:   Your symptoms are not improving after 3 days of treatment.  You have increased discharge or pain.  You have a fever. MAKE SURE YOU:   Understand these instructions.  Will watch your condition.  Will get help right away if you are not doing well or get worse. FOR MORE INFORMATION  Centers for Disease Control and Prevention, Division of STD Prevention: AppraiserFraud.fi American Sexual Health Association (ASHA): www.ashastd.org  Document Released: 06/04/2005 Document Revised: 03/25/2013 Document Reviewed: 01/14/2013 [facility] Patient Information 2015 [place], [place]. This information is not intended to replace advice given to you by your health care provider. Make sure you discuss any questions you have with your health care provider.

## 2015-01-13 NOTE — Progress Notes (Signed)
Pre visit review using our clinic review tool, if applicable. No additional management support is needed unless otherwise documented below in the visit note. 

## 2015-01-13 NOTE — Progress Notes (Signed)
   Subjective:    Patient ID: Alicia Blair, female    DOB: 1975-12-08, 39 y.o.   MRN: [id number]  HPI  [name] is a 39 yo female with a CC of vaginitis.   1) Pt reports pruritis, burning, discomfort, denies discharge x 3 days. Pt reports it feels the exact same way as when she saw Dr. Derrel Nip in the fall. Pt started new relationship and has recently become intimate with the partner.  No order/ discharge/ no douching   Review of Systems  Constitutional: Negative for fever, chills, diaphoresis and fatigue.  Gastrointestinal: Negative for nausea, vomiting and diarrhea.  Genitourinary: Negative for vaginal bleeding, vaginal discharge and vaginal pain.  Skin: Positive for rash.      Objective:   Physical Exam  Constitutional: She is oriented to person, place, and time. She appears well-developed and well-nourished. No distress.  BP 108/88 mmHg  Pulse 81  Temp(Src) 98.5 F (36.9 C)  Resp 14  Ht 5' 7"  (1.702 m)  Wt 177 lb 9.6 oz (80.559 kg)  BMI 27.81 kg/m2  SpO2 97%   HENT:  Head: Normocephalic and atraumatic.  Right Ear: External ear normal.  Left Ear: External ear normal.  Pulmonary/Chest: Effort normal and breath sounds normal.  Genitourinary: There is erythema in the vagina. No tenderness or bleeding in the vagina. No foreign body around the vagina. No signs of injury around the vagina. No vaginal discharge found.  No discharge, slight odor, excoriation of labia. Erythema around the vulva   Neurological: She is alert and oriented to person, place, and time. No cranial nerve deficit. She exhibits normal muscle tone. Coordination normal.  Skin: Skin is warm and dry. No rash noted. She is not diaphoretic.  Psychiatric: She has a normal mood and affect. Her behavior is normal. Judgment and thought content normal.      Assessment & Plan:

## 2015-01-13 NOTE — Assessment & Plan Note (Signed)
Obtained wet prep. Will start on oral flagyl again today since probable BV. Will follow up after results. Gave pt handout on BV and causes including new sexual partners.

## 2015-01-14 ENCOUNTER — Other Ambulatory Visit: Payer: Self-pay | Admitting: Nurse Practitioner

## 2015-01-14 ENCOUNTER — Encounter: Payer: Self-pay | Admitting: Internal Medicine

## 2015-01-14 DIAGNOSIS — T3695XA Adverse effect of unspecified systemic antibiotic, initial encounter: Principal | ICD-10-CM

## 2015-01-14 DIAGNOSIS — B379 Candidiasis, unspecified: Secondary | ICD-10-CM

## 2015-01-14 LAB — WET PREP BY MOLECULAR PROBE
CANDIDA SPECIES: POSITIVE — AB
Gardnerella vaginalis: POSITIVE — AB
TRICHOMONAS VAG: NEGATIVE

## 2015-01-14 MED ORDER — FLUCONAZOLE 150 MG PO TABS: 150.0000 mg | ORAL_TABLET | Freq: Once | ORAL | Status: DC

## 2015-01-27 ENCOUNTER — Other Ambulatory Visit: Payer: 59

## 2015-02-10 ENCOUNTER — Other Ambulatory Visit (HOSPITAL_COMMUNITY)
Admission: RE | Admit: 2015-02-10 | Discharge: 2015-02-10 | Disposition: A | Discharge status: Home / Self Care | Payer: 59 | Source: Ambulatory Visit | Attending: Internal Medicine | Admitting: Internal Medicine

## 2015-02-10 ENCOUNTER — Ambulatory Visit (INDEPENDENT_AMBULATORY_CARE_PROVIDER_SITE_OTHER): Payer: 59 | Admitting: Internal Medicine

## 2015-02-10 ENCOUNTER — Encounter: Payer: Self-pay | Admitting: Internal Medicine

## 2015-02-10 VITALS — BP 124/78 | HR 83 | Temp 98.3°F | Resp 12 | Ht 66.5 in | Wt 178.2 lb

## 2015-02-10 DIAGNOSIS — Z1239 Encounter for other screening for malignant neoplasm of breast: Secondary | ICD-10-CM

## 2015-02-10 DIAGNOSIS — R5383 Other fatigue: Secondary | ICD-10-CM

## 2015-02-10 DIAGNOSIS — N760 Acute vaginitis: Secondary | ICD-10-CM

## 2015-02-10 DIAGNOSIS — E785 Hyperlipidemia, unspecified: Secondary | ICD-10-CM | POA: Diagnosis not present

## 2015-02-10 DIAGNOSIS — Z113 Encounter for screening for infections with a predominantly sexual mode of transmission: Secondary | ICD-10-CM

## 2015-02-10 DIAGNOSIS — Z1151 Encounter for screening for human papillomavirus (HPV): Secondary | ICD-10-CM | POA: Diagnosis present

## 2015-02-10 DIAGNOSIS — R87810 Cervical high risk human papillomavirus (HPV) DNA test positive: Secondary | ICD-10-CM | POA: Insufficient documentation

## 2015-02-10 DIAGNOSIS — Z01411 Encounter for gynecological examination (general) (routine) with abnormal findings: Secondary | ICD-10-CM | POA: Insufficient documentation

## 2015-02-10 DIAGNOSIS — IMO0002 Reserved for concepts with insufficient information to code with codable children: Secondary | ICD-10-CM

## 2015-02-10 DIAGNOSIS — Z0000 Encounter for general adult medical examination without abnormal findings: Secondary | ICD-10-CM

## 2015-02-10 DIAGNOSIS — Z124 Encounter for screening for malignant neoplasm of cervix: Secondary | ICD-10-CM

## 2015-02-10 DIAGNOSIS — R896 Abnormal cytological findings in specimens from other organs, systems and tissues: Secondary | ICD-10-CM

## 2015-02-10 LAB — LIPID PANEL
CHOL/HDL RATIO: 3
Cholesterol: 200 mg/dL (ref 0–200)
HDL: 76.8 mg/dL (ref >39.00)
LDL CALC: 114 mg/dL — AB (ref 0–99)
NonHDL: 123.02
Triglycerides: 43 mg/dL (ref 0.0–149.0)
VLDL: 8.6 mg/dL (ref 0.0–40.0)

## 2015-02-10 LAB — CBC WITH DIFFERENTIAL/PLATELET
BASOS PCT: 0.5 % (ref 0.0–3.0)
Basophils Absolute: 0 10*3/uL (ref 0.0–0.1)
EOS ABS: 0.1 10*3/uL (ref 0.0–0.7)
Eosinophils Relative: 1.4 % (ref 0.0–5.0)
HCT: 40.6 % (ref 36.0–46.0)
HEMOGLOBIN: 13.7 g/dL (ref 12.0–15.0)
Lymphocytes Relative: 31.6 % (ref 12.0–46.0)
Lymphs Abs: 1.9 10*3/uL (ref 0.7–4.0)
MCHC: 33.7 g/dL (ref 30.0–36.0)
MCV: 91.9 fl (ref 78.0–100.0)
MONO ABS: 0.5 10*3/uL (ref 0.1–1.0)
Monocytes Relative: 9.2 % (ref 3.0–12.0)
Neutro Abs: 3.4 10*3/uL (ref 1.4–7.7)
Neutrophils Relative %: 57.3 % (ref 43.0–77.0)
PLATELETS: 207 10*3/uL (ref 150.0–400.0)
RBC: 4.42 Mil/uL (ref 3.87–5.11)
RDW: 13.7 % (ref 11.5–15.5)
WBC: 5.9 10*3/uL (ref 4.0–10.5)

## 2015-02-10 LAB — COMPREHENSIVE METABOLIC PANEL
ALBUMIN: 4.5 g/dL (ref 3.5–5.2)
ALT: 15 U/L (ref 0–35)
AST: 26 U/L (ref 0–37)
Alkaline Phosphatase: 33 U/L — ABNORMAL LOW (ref 39–117)
BUN: 12 mg/dL (ref 6–23)
CHLORIDE: 105 meq/L (ref 96–112)
CO2: 26 meq/L (ref 19–32)
CREATININE: 0.6 mg/dL (ref 0.40–1.20)
Calcium: 9.6 mg/dL (ref 8.4–10.5)
GFR: 118.22 mL/min (ref >60.00)
Glucose, Bld: 76 mg/dL (ref 70–99)
Potassium: 4.2 mEq/L (ref 3.5–5.1)
SODIUM: 141 meq/L (ref 135–145)
Total Bilirubin: 0.5 mg/dL (ref 0.2–1.2)
Total Protein: 6.9 g/dL (ref 6.0–8.3)

## 2015-02-10 LAB — TSH: TSH: 2.63 u[IU]/mL (ref 0.35–4.50)

## 2015-02-10 MED ORDER — CLOBETASOL PROPIONATE 0.05 % EX CREA: 1.0000 "application " | TOPICAL_CREAM | Freq: Two times a day (BID) | CUTANEOUS | Status: DC

## 2015-02-10 MED ORDER — FLUCONAZOLE 150 MG PO TABS: 150.0000 mg | ORAL_TABLET | Freq: Every day | ORAL | Status: DC

## 2015-02-10 NOTE — Patient Instructions (Signed)
Rx for fluconazole  (tablets) and clobetasol cream use sparingly for irritation not due to infection) sent to [facility] outpatient pharmacy  I recommend trying "Platinum" to prevent irritation from friction during intercourse  Fasting labs today'   Mammogram needed

## 2015-02-10 NOTE — Progress Notes (Signed)
Patient ID: Alicia Blair, female    DOB: 03/25/1976  Age: 39 y.o. MRN: [id number]  The patient is here for annual  wellness examination and management of other chronic and acute problems.    Last PAP 2014,  Needs one today given history of new partner    The risk factors are reflected in the social history.  The roster of all physicians providing medical care to patient - is listed in the Snapshot section of the chart.  Home safety : The patient has smoke detectors in the home. They wear seatbelts.  There are no firearms at home. There is no violence in the home.   There is no risks for hepatitis, STDs or HIV. There is no   history of blood transfusion. They have no travel history to infectious disease endemic areas of the world.  The patient has seen their dentist in the last six month. They have seen their eye doctor in the last year. T  They do not  have excessive sun exposure. Discussed the need for sun protection: hats, long sleeves and use of sunscreen if there is significant sun exposure.   Diet: the importance of a healthy diet is discussed. They do have a healthy diet.  The benefits of regular aerobic exercise were discussed. She walks 4 times per week ,  20 minutes.   Depression screen: there are no signs or vegative symptoms of depression- irritability, change in appetite, anhedonia, sadness/tearfullness.   The following portions of the patient's history were reviewed and updated as appropriate: allergies, current medications, past family history, past medical history,  past surgical history, past social history  and problem list.  Visual acuity was not assessed per patient preference since she has regular follow up with her ophthalmologist. Hearing and body mass index were assessed and reviewed.   During the course of the visit the patient was educated and counseled about appropriate screening and preventive services including : fall prevention , diabetes screening, nutrition  counseling, colorectal cancer screening, and recommended immunizations.    CC: The primary encounter diagnosis was Breast cancer screening. Diagnoses of Other fatigue, Hyperlipidemia LDL goal <160, Screen for STD (sexually transmitted disease), Cervical cancer screening, Vaginitis and vulvovaginitis, ASCUS with positive high risk HPV, Screening for cervical cancer, and Visit for preventive health examination were also pertinent to this visit.  She was treated last month for vagnitis secondary to gardnerella and candida in the setting of new sexual partner.  Symptoms of vaginal discharge have resolved , but she continues to have vaginal discomfort which occurs after intercourse.  History Afsa has a past medical history of Anemia and Chicken pox.   She has past surgical history that includes Appendectomy (1999); Laparoscopic gastric banding (2008); and breast augmentation (2003).   Her family history includes Cancer (age of onset: 68) in her brother; Cancer (age of onset: 25) in her mother; Diabetes in her maternal aunt, maternal grandmother, and mother.She reports that she has never smoked. She does not have any smokeless tobacco history on file. She reports that she drinks about 1.8 oz of alcohol per week. She reports that she does not use illicit drugs.  Outpatient Prescriptions Prior to Visit  Medication Sig Dispense Refill  . calcipotriene-betamethasone (TACLONEX SCALP) external suspension Apply topically daily. 60 g 1  . clobetasol (TEMOVATE) 0.05 % external solution Apply 1 application topically 2 (two) times daily as needed. 50 mL 3  . diazepam (VALIUM) 5 MG tablet Take 1 tablet (5 mg total)  by mouth every 6 (six) hours as needed. For air sickness 30 tablet 0  . fluconazole (DIFLUCAN) 150 MG tablet Take 1 tablet (150 mg total) by mouth once. 2 tablet 0  . Fluocinolone Acetonide 0.01 % OIL Apply 1 application topically daily as needed. 118 mL 3  . levonorgestrel (MIRENA) 20 MCG/24HR IUD 1  each by Intrauterine route once. Inserted 01/2014    . metroNIDAZOLE (FLAGYL) 500 MG tablet Take 1 tablet (500 mg total) by mouth 2 (two) times daily. 14 tablet 0  . metroNIDAZOLE (METROGEL) 0.75 % gel Apply 1 application topically 2 (two) times daily. 45 g 0  . Multiple Vitamin (MULTIVITAMIN) tablet Take 1 tablet by mouth daily.    . mupirocin ointment (BACTROBAN) 2 % Apply topically 3 (three) times daily. 22 g 0  . nystatin-triamcinolone ointment (MYCOLOG) Apply topically 2 (two) times daily. 30 g 0  . ondansetron (ZOFRAN) 4 MG tablet Take 1 tablet (4 mg total) by mouth every 8 (eight) hours as needed for nausea or vomiting. 20 tablet 0   No facility-administered medications prior to visit.    Review of Systems   Patient denies headache, fevers, malaise, unintentional weight loss, skin rash, eye pain, sinus congestion and sinus pain, sore throat, dysphagia,  hemoptysis , cough, dyspnea, wheezing, chest pain, palpitations, orthopnea, edema, abdominal pain, nausea, melena, diarrhea, constipation, flank pain, dysuria, hematuria, urinary  Frequency, nocturia, numbness, tingling, seizures,  Focal weakness, Loss of consciousness,  Tremor, insomnia, depression, anxiety, and suicidal ideation.      Objective:  BP 124/78 mmHg  Pulse 83  Temp(Src) 98.3 F (36.8 C) (Oral)  Resp 12  Ht 5' 6.5" (1.689 m)  Wt 178 lb 4 oz (80.854 kg)  BMI 28.34 kg/m2  SpO2 97%  LMP 01/24/2015  Physical Exam   General Appearance:    Alert, cooperative, no distress, appears stated age  Head:    Normocephalic, without obvious abnormality, atraumatic  Eyes:    PERRL, conjunctiva/corneas clear, EOM's intact, fundi    benign, both eyes  Ears:    Normal TM's and external ear canals, both ears  Nose:   Nares normal, septum midline, mucosa normal, no drainage    or sinus tenderness  Throat:   Lips, mucosa, and tongue normal; teeth and gums normal  Neck:   Supple, symmetrical, trachea midline, no adenopathy;     thyroid:  no enlargement/tenderness/nodules; no carotid   bruit or JVD  Back:     Symmetric, no curvature, ROM normal, no CVA tenderness  Lungs:     Clear to auscultation bilaterally, respirations unlabored  Chest Wall:    No tenderness or deformity   Heart:    Regular rate and rhythm, S1 and S2 normal, no murmur, rub   or gallop  Breast Exam:    No tenderness, masses, or nipple abnormality  Abdomen:     Soft, non-tender, bowel sounds active all four quadrants,    no masses, no organomegaly  Genitalia:    Pelvic: cervix normal in appearance, external genitalia normal, no adnexal masses or tenderness, no cervical motion tenderness, rectovaginal septum normal, uterus normal size, shape, and consistency and vagina normal without discharge  Extremities:   Extremities normal, atraumatic, no cyanosis or edema  Pulses:   2+ and symmetric all extremities  Skin:   Skin color, texture, turgor normal, no rashes or lesions  Lymph nodes:   Cervical, supraclavicular, and axillary nodes normal  Neurologic:   CNII-XII intact, normal strength, sensation and reflexes  throughout       Assessment & Plan:   Problem List Items Addressed This Visit      Unprioritized   RESOLVED: ASCUS with positive high risk HPV    P      Relevant Medications   fluconazole (DIFLUCAN) 150 MG tablet   Visit for preventive health examination    Annual comprehensive preventive exam was done as well as an evaluation and management of chronic conditions .  During the course of the visit the patient was educated and counseled about appropriate screening and preventive services including :  diabetes screening, lipid analysis with projected  10 year  risk for CAD  Which is 4.7 % using the Framingham risk calculator for women, , nutrition counseling, colorectal cancer screening, and recommended immunizations.  Printed recommendations for health maintenance screenings was given.        Vaginitis and vulvovaginitis     Resolved by exam,  History of vaginal irritation may be due to inadequate lubrication during prolonged intercourse.  Topical lubrications discussed.       Screening for cervical cancer    She is s/p normal endocervical biopsy in  December 2013 with repeat Pap smear and HPV screen .   normal June 2014 .  Repeat PAP was done today        Other Visit Diagnoses    Breast cancer screening    -  Primary    Relevant Orders    MM DIGITAL SCREENING BILATERAL    Other fatigue        Hyperlipidemia LDL goal <160        Screen for STD (sexually transmitted disease)        Cervical cancer screening        Relevant Orders    Cytology - PAP       I am having Ms. Eastman start on fluconazole and clobetasol cream. I am also having her maintain her multivitamin, nystatin-triamcinolone ointment, ondansetron, diazepam, calcipotriene-betamethasone, clobetasol, Fluocinolone Acetonide, mupirocin ointment, levonorgestrel, metroNIDAZOLE, metroNIDAZOLE, and fluconazole.  Meds ordered this encounter  Medications  . fluconazole (DIFLUCAN) 150 MG tablet    Sig: Take 1 tablet (150 mg total) by mouth daily. As needed for vaginitis    Dispense:  10 tablet    Refill:  0  . clobetasol cream (TEMOVATE) 0.05 %    Sig: Apply 1 application topically 2 (two) times daily. As needed    Dispense:  15 g    Refill:  0    There are no discontinued medications.  Follow-up: No Follow-up on file.   Crecencio Mc, MD

## 2015-02-10 NOTE — Progress Notes (Signed)
Pre-visit discussion using our clinic review tool. No additional management support is needed unless otherwise documented below in the visit note.  

## 2015-02-11 LAB — HIV ANTIBODY (ROUTINE TESTING W REFLEX): HIV: NONREACTIVE

## 2015-02-11 LAB — HEPATITIS C ANTIBODY: HCV Ab: NEGATIVE

## 2015-02-11 LAB — CYTOLOGY - PAP

## 2015-02-12 ENCOUNTER — Encounter: Payer: Self-pay | Admitting: Internal Medicine

## 2015-02-12 DIAGNOSIS — Z124 Encounter for screening for malignant neoplasm of cervix: Secondary | ICD-10-CM | POA: Insufficient documentation

## 2015-02-12 NOTE — Assessment & Plan Note (Signed)
Resolved by exam,  History of vaginal irritation may be due to inadequate lubrication during prolonged intercourse.  Topical lubrications discussed.

## 2015-02-12 NOTE — Assessment & Plan Note (Signed)
P 

## 2015-02-12 NOTE — Assessment & Plan Note (Signed)
She is s/p normal endocervical biopsy in  December 2013 with repeat Pap smear and HPV screen .   normal June 2014 .  Repeat PAP was done today

## 2015-02-12 NOTE — Assessment & Plan Note (Signed)
Annual comprehensive preventive exam was done as well as an evaluation and management of chronic conditions .  During the course of the visit the patient was educated and counseled about appropriate screening and preventive services including :  diabetes screening, lipid analysis with projected  10 year  risk for CAD  Which is 4.7 % using the Framingham risk calculator for women, , nutrition counseling, colorectal cancer screening, and recommended immunizations.  Printed recommendations for health maintenance screenings was given.

## 2015-02-13 ENCOUNTER — Encounter: Payer: Self-pay | Admitting: Internal Medicine

## 2015-02-15 ENCOUNTER — Encounter: Payer: Self-pay | Admitting: Internal Medicine

## 2015-02-16 ENCOUNTER — Other Ambulatory Visit: Payer: Self-pay | Admitting: Internal Medicine

## 2015-02-16 DIAGNOSIS — N870 Mild cervical dysplasia: Secondary | ICD-10-CM

## 2015-02-28 ENCOUNTER — Other Ambulatory Visit: Payer: Self-pay | Admitting: Obstetrics and Gynecology

## 2015-03-03 ENCOUNTER — Encounter: Payer: Self-pay | Admitting: Internal Medicine

## 2015-03-29 ENCOUNTER — Encounter: Payer: Self-pay | Admitting: Nurse Practitioner

## 2015-03-29 ENCOUNTER — Ambulatory Visit (INDEPENDENT_AMBULATORY_CARE_PROVIDER_SITE_OTHER): Payer: 59 | Admitting: Nurse Practitioner

## 2015-03-29 VITALS — BP 110/72 | HR 78 | Temp 97.7°F | Resp 12 | Ht 66.5 in | Wt 185.2 lb

## 2015-03-29 DIAGNOSIS — N760 Acute vaginitis: Secondary | ICD-10-CM

## 2015-03-29 NOTE — Patient Instructions (Signed)
We'll call you tomorrow with results (Check MyChart just in case we send it there, too)

## 2015-03-29 NOTE — Progress Notes (Signed)
Pre visit review using our clinic review tool, if applicable. No additional management support is needed unless otherwise documented below in the visit note. 

## 2015-03-29 NOTE — Progress Notes (Signed)
Patient ID: Alicia Blair, female    DOB: 1976-05-03  Age: 39 y.o. MRN: [id number]  CC: Vaginal Discharge   HPI Alicia Blair presents for chief complaint of vaginitis Imes 4 days.  1) Diflucan on Sunday- somewhat helpful  Yellowish discharge  No odor Sexual intercourse recently   History Alicia Blair has a past medical history of Anemia and Chicken pox.   She has past surgical history that includes Appendectomy (1999); Laparoscopic gastric banding (2008); and breast augmentation (2003).   Her family history includes Cancer (age of onset: 20) in her brother; Cancer (age of onset: 50) in her mother; Diabetes in her maternal aunt, maternal grandmother, and mother.She reports that she has never smoked. She does not have any smokeless tobacco history on file. She reports that she drinks about 1.8 oz of alcohol per week. She reports that she does not use illicit drugs.  Outpatient Prescriptions Prior to Visit  Medication Sig Dispense Refill  . calcipotriene-betamethasone (TACLONEX SCALP) external suspension Apply topically daily. 60 g 1  . clobetasol (TEMOVATE) 0.05 % external solution Apply 1 application topically 2 (two) times daily as needed. 50 mL 3  . clobetasol cream (TEMOVATE) 9.62 % Apply 1 application topically 2 (two) times daily. As needed 15 g 0  . diazepam (VALIUM) 5 MG tablet Take 1 tablet (5 mg total) by mouth every 6 (six) hours as needed. For air sickness 30 tablet 0  . Fluocinolone Acetonide 0.01 % OIL Apply 1 application topically daily as needed. 118 mL 3  . levonorgestrel (MIRENA) 20 MCG/24HR IUD 1 each by Intrauterine route once. Inserted 01/2014    . Multiple Vitamin (MULTIVITAMIN) tablet Take 1 tablet by mouth daily.    . mupirocin ointment (BACTROBAN) 2 % Apply topically 3 (three) times daily. 22 g 0  . nystatin-triamcinolone ointment (MYCOLOG) Apply topically 2 (two) times daily. 30 g 0  . ondansetron (ZOFRAN) 4 MG tablet Take 1 tablet (4 mg total) by mouth every 8  (eight) hours as needed for nausea or vomiting. 20 tablet 0  . fluconazole (DIFLUCAN) 150 MG tablet Take 1 tablet (150 mg total) by mouth once. 2 tablet 0  . fluconazole (DIFLUCAN) 150 MG tablet Take 1 tablet (150 mg total) by mouth daily. As needed for vaginitis 10 tablet 0  . metroNIDAZOLE (FLAGYL) 500 MG tablet Take 1 tablet (500 mg total) by mouth 2 (two) times daily. 14 tablet 0  . metroNIDAZOLE (METROGEL) 0.75 % gel Apply 1 application topically 2 (two) times daily. 45 g 0   No facility-administered medications prior to visit.    ROS Review of Systems  Constitutional: Negative for fever, chills, diaphoresis and fatigue.  Genitourinary: Positive for vaginal discharge. Negative for dysuria, urgency, frequency, hematuria, flank pain, decreased urine volume, vaginal bleeding, difficulty urinating, vaginal pain and pelvic pain.  Skin: Negative for rash.    Objective:  BP 110/72 mmHg  Pulse 78  Temp(Src) 97.7 F (36.5 C) (Oral)  Resp 12  Ht 5' 6.5" (1.689 m)  Wt 185 lb 3.2 oz (84.006 kg)  BMI 29.45 kg/m2  SpO2 97%  Physical Exam  Constitutional: She is oriented to person, place, and time. She appears well-developed and well-nourished. No distress.  HENT:  Head: Normocephalic and atraumatic.  Right Ear: External ear normal.  Left Ear: External ear normal.  Abdominal: There is no CVA tenderness.  Genitourinary: Vaginal discharge found.  Discharge was thick and white  Neurological: She is alert and oriented to person, place, and time.  Skin: Skin is warm and dry. No rash noted. She is not diaphoretic.  Psychiatric: She has a normal mood and affect. Her behavior is normal. Judgment and thought content normal.   Assessment & Plan:   Alicia Blair was seen today for vaginal discharge.  Diagnoses and all orders for this visit:  Vaginitis and vulvovaginitis -     WET PREP BY MOLECULAR PROBE   I have discontinued Ms. Eastman's metroNIDAZOLE, metroNIDAZOLE, and fluconazole. I am also  having her maintain her multivitamin, nystatin-triamcinolone ointment, ondansetron, diazepam, calcipotriene-betamethasone, clobetasol, Fluocinolone Acetonide, mupirocin ointment, levonorgestrel, and clobetasol cream.  No orders of the defined types were placed in this encounter.     Follow-up: Return if symptoms worsen or fail to improve.

## 2015-03-30 ENCOUNTER — Other Ambulatory Visit: Payer: Self-pay | Admitting: Nurse Practitioner

## 2015-03-30 ENCOUNTER — Encounter: Payer: Self-pay | Admitting: Nurse Practitioner

## 2015-03-30 LAB — WET PREP BY MOLECULAR PROBE
Candida species: NEGATIVE
GARDNERELLA VAGINALIS: POSITIVE — AB
Trichomonas vaginosis: NEGATIVE

## 2015-03-30 MED ORDER — FLUCONAZOLE 150 MG PO TABS: 150.0000 mg | ORAL_TABLET | Freq: Every day | ORAL | Status: DC

## 2015-03-30 MED ORDER — METRONIDAZOLE 500 MG PO TABS: 500.0000 mg | ORAL_TABLET | Freq: Two times a day (BID) | ORAL | Status: DC

## 2015-04-01 NOTE — Assessment & Plan Note (Signed)
Patient does have chronic vaginitis. Obtained another wet prep today. Will follow with results

## 2015-04-11 ENCOUNTER — Ambulatory Visit: Payer: Self-pay | Admitting: Physician Assistant

## 2015-04-11 ENCOUNTER — Encounter: Payer: Self-pay | Admitting: Physician Assistant

## 2015-04-11 VITALS — BP 110/84 | Temp 98.6°F

## 2015-04-11 DIAGNOSIS — J018 Other acute sinusitis: Secondary | ICD-10-CM

## 2015-04-11 MED ORDER — FLUTICASONE PROPIONATE 50 MCG/ACT NA SUSP: 2.0000 | Freq: Every day | NASAL | Status: DC

## 2015-04-11 NOTE — Progress Notes (Signed)
S: pressure in face, no cough or congestion, just feels really tired, son has been sick with cold sx, pt states she has not had fever/chills, cough, v/d; sx for 2 days,   O: vitals wnl, nad, tms clear, nasal mucosa red, throat wnl, neck supple no lymph, lungs c t a, cv rrr  A: acute viral sinusitis  P: flonase, if worsening will call in antibiotic, told pt since she just finished flagyll don't want to put her on an antibiotic right now

## 2015-07-04 DIAGNOSIS — R42 Dizziness and giddiness: Secondary | ICD-10-CM | POA: Diagnosis not present

## 2015-08-02 ENCOUNTER — Ambulatory Visit: Payer: Self-pay

## 2015-08-09 DIAGNOSIS — B373 Candidiasis of vulva and vagina: Secondary | ICD-10-CM | POA: Diagnosis not present

## 2015-08-09 MED FILL — TERCONAZOLE 80 MG SUPP: 80 | 3 days supply | Qty: 3 | Fill #0

## 2015-08-19 ENCOUNTER — Ambulatory Visit: Payer: Self-pay

## 2015-08-22 ENCOUNTER — Ambulatory Visit: Payer: Self-pay

## 2015-08-24 ENCOUNTER — Ambulatory Visit: Payer: Self-pay | Admitting: Physician Assistant

## 2015-08-24 ENCOUNTER — Encounter: Payer: Self-pay | Admitting: Physician Assistant

## 2015-08-24 VITALS — BP 100/80 | HR 64 | Temp 98.3°F

## 2015-08-24 DIAGNOSIS — J029 Acute pharyngitis, unspecified: Secondary | ICD-10-CM

## 2015-08-25 NOTE — Progress Notes (Signed)
See scanned notes by Nira Conn RAtcliffe

## 2015-08-31 LAB — POCT RAPID STREP A (OFFICE): Rapid Strep A Screen: NEGATIVE

## 2015-09-06 DIAGNOSIS — R87612 Low grade squamous intraepithelial lesion on cytologic smear of cervix (LGSIL): Secondary | ICD-10-CM | POA: Diagnosis not present

## 2015-09-06 DIAGNOSIS — N760 Acute vaginitis: Secondary | ICD-10-CM | POA: Diagnosis not present

## 2015-09-06 DIAGNOSIS — N870 Mild cervical dysplasia: Secondary | ICD-10-CM | POA: Diagnosis not present

## 2015-10-03 DIAGNOSIS — N911 Secondary amenorrhea: Secondary | ICD-10-CM | POA: Diagnosis not present

## 2015-10-03 MED FILL — DICLEGIS DR 10-10 MG TABLET: 10-10 | 30 days supply | Qty: 60 | Fill #0

## 2015-10-05 ENCOUNTER — Inpatient Hospital Stay (HOSPITAL_COMMUNITY)
Admission: AD | Admit: 2015-10-05 | Discharge: 2015-10-05 | Disposition: A | Discharge status: Home / Self Care | Payer: 59 | Source: Ambulatory Visit | Attending: Obstetrics and Gynecology | Admitting: Obstetrics and Gynecology

## 2015-10-05 ENCOUNTER — Encounter (HOSPITAL_COMMUNITY): Payer: Self-pay | Admitting: *Deleted

## 2015-10-05 DIAGNOSIS — J45909 Unspecified asthma, uncomplicated: Secondary | ICD-10-CM | POA: Diagnosis not present

## 2015-10-05 DIAGNOSIS — Z3A01 Less than 8 weeks gestation of pregnancy: Secondary | ICD-10-CM | POA: Insufficient documentation

## 2015-10-05 DIAGNOSIS — R197 Diarrhea, unspecified: Secondary | ICD-10-CM | POA: Insufficient documentation

## 2015-10-05 DIAGNOSIS — R42 Dizziness and giddiness: Secondary | ICD-10-CM | POA: Diagnosis not present

## 2015-10-05 DIAGNOSIS — O210 Mild hyperemesis gravidarum: Secondary | ICD-10-CM | POA: Insufficient documentation

## 2015-10-05 DIAGNOSIS — E860 Dehydration: Secondary | ICD-10-CM | POA: Insufficient documentation

## 2015-10-05 DIAGNOSIS — Z79899 Other long term (current) drug therapy: Secondary | ICD-10-CM | POA: Insufficient documentation

## 2015-10-05 DIAGNOSIS — O219 Vomiting of pregnancy, unspecified: Secondary | ICD-10-CM | POA: Diagnosis not present

## 2015-10-05 HISTORY — DX: Unspecified asthma, uncomplicated: J45.909

## 2015-10-05 LAB — URINALYSIS, ROUTINE W REFLEX MICROSCOPIC
BILIRUBIN URINE: NEGATIVE
Glucose, UA: NEGATIVE mg/dL
KETONES UR: 15 mg/dL — AB
NITRITE: NEGATIVE
PROTEIN: NEGATIVE mg/dL
Specific Gravity, Urine: >1.03 — ABNORMAL HIGH (ref 1.005–1.030)
pH: 5.5 (ref 5.0–8.0)

## 2015-10-05 LAB — URINE MICROSCOPIC-ADD ON

## 2015-10-05 LAB — POCT PREGNANCY, URINE: Preg Test, Ur: POSITIVE — AB

## 2015-10-05 MED ORDER — METOCLOPRAMIDE HCL 5 MG/ML IJ SOLN
10.0000 mg | Freq: Once | INTRAMUSCULAR | Status: AC
  Administered 2015-10-05: 10 mg via INTRAVENOUS
  Filled 2015-10-05: qty 2

## 2015-10-05 MED ORDER — DEXTROSE IN LACTATED RINGERS 5 % IV SOLN
Freq: Once | INTRAVENOUS | Status: AC
  Administered 2015-10-05: 14:00:00 via INTRAVENOUS

## 2015-10-05 MED ORDER — METOCLOPRAMIDE HCL 10 MG PO TABS: 10.0000 mg | ORAL_TABLET | Freq: Four times a day (QID) | ORAL | Status: DC

## 2015-10-05 MED ORDER — M.V.I. ADULT IV INJ
Freq: Once | INTRAVENOUS | Status: AC
  Administered 2015-10-05: 15:00:00 via INTRAVENOUS
  Filled 2015-10-05: qty 10

## 2015-10-05 NOTE — MAU Provider Note (Signed)
Chief Complaint: Dehydration   First Provider Initiated Contact with Patient 10/05/15 1305        SUBJECTIVE HPI  Alicia Blair is a 41 y.o. G3P1001 at [id number] LMP who presents to maternity admissions reporting nausea, vomiting and diarrhea.  The nausea and vomiting started a week and a half ago.   The diarrhea started on the weekend and lasted 2 days. Does not have it now.  Has tried using Diclegis, but only at night.  Denies vaginal bleeding, vaginal itching/burning, urinary symptoms, h/a, dizziness, or fever/chills.    History is remarkable for gastric banding, which makes vomiting difficult.  RN Note: Pt states started to feel sick last Monday with nausea and vomiting. Saw her provider Monday and was given diclegis, but medicine has not been working. PT also states had diarrhea earlier this week but none recently. Pt also complains of dizziness.   Past Medical History  Diagnosis Date  . Chicken pox   . Asthma     as a child   Past Surgical History  Procedure Laterality Date  . Laparoscopic gastric banding  2008  . Breast augmentation  2003   Social History   Social History  . Marital Status: Single    Spouse Name: N/A  . Number of Children: N/A  . Years of Education: N/A   Occupational History  . Not on file.   Social History Main Topics  . Smoking status: Never Smoker   . Smokeless tobacco: Not on file  . Alcohol Use: No     Comment: social  . Drug Use: No  . Sexual Activity: Yes    Birth Control/ Protection: None   Other Topics Concern  . Not on file   Social History Narrative   [facility]   No current facility-administered medications on file prior to encounter.   Current Outpatient Prescriptions on File Prior to Encounter  Medication Sig Dispense Refill  . calcipotriene-betamethasone (TACLONEX SCALP) external suspension Apply topically daily. 60 g 1  . clobetasol (TEMOVATE) 0.05 % external solution Apply 1 application topically 2  (two) times daily as needed. 50 mL 3  . clobetasol cream (TEMOVATE) 07.84% Apply 1 application topically 2 (two) times daily. As needed 15 g 0  . diazepam (VALIUM) 5 MG tablet Take 1 tablet (5 mg total) by mouth every 6 (six) hours as needed. For air sickness 30 tablet 0  . fluconazole (DIFLUCAN) 150 MG tablet Take 1 tablet (150 mg total) by mouth daily. As needed for vaginitis (Patient not taking: Reported on 08/24/2015) 5 tablet 0  . Fluocinolone Acetonide 0.01 % OIL Apply 1 application topically daily as needed. 118 mL 3  . fluticasone (FLONASE) 50 MCG/ACT nasal spray Place 2 sprays into both nostrils daily. 16 g 6  . levonorgestrel (MIRENA) 20 MCG/24HR IUD 1 each by Intrauterine route once. Reported on 08/24/2015    . metroNIDAZOLE (FLAGYL) 500 MG tablet Take 1 tablet (500 mg total) by mouth 2 (two) times daily. (Patient not taking: Reported on 04/11/2015) 14 tablet 0  . Multiple Vitamin (MULTIVITAMIN) tablet Take 1 tablet by mouth daily.    . mupirocin ointment (BACTROBAN) 2 % Apply topically 3 (three) times daily. 22 g 0  . nystatin-triamcinolone ointment (MYCOLOG) Apply topically 2 (two) times daily. 30 g 0  . ondansetron (ZOFRAN) 4 MG tablet Take 1 tablet (4 mg total) by mouth every 8 (eight) hours as needed for nausea or vomiting. 20 tablet 0  .  promethazine (PHENERGAN) 25 MG tablet   3  . terconazole (TERAZOL 3) 80 MG vaginal suppository   0   Allergies  Allergen Reactions  . Codeine   . Erythromycin Nausea And Vomiting    I have reviewed patient's Past Medical Hx, Surgical Hx, Family Hx, Social Hx, medications and allergies.   ROS:  Review of Systems   Constitutional: Negative for fever and chills.  Gastrointestinal: Negative for abdominal pain, and constipation.  Positive for nausea, vomiting, and diarrhea Genitourinary: Negative for dysuria. Negative for flank pain. Musculoskeletal: Negative for back pain.  Neurological: Negative for dizziness. Positive for weakness and  lethargy.   Physical Exam  Patient Vitals for the past 24 hrs:  BP Pulse Resp Weight  10/05/15 1258 120/72 mmHg 98 - -  10/05/15 1255 122/74 mmHg 98 - -  10/05/15 1254 125/76 mmHg 86 - -  10/05/15 1253 120/60 mmHg 80 18 83.698 kg (184 lb 8.3 oz)   Physical Exam  Constitutional: Well-developed female in no acute distress, but ill-appearing.  Cardiovascular: mild tachycardia, regular rhythm Respiratory: normal effort, clear bilaterally GI: Abd soft, non-tender. Pos BS x 4 MS: Extremities nontender, no edema, normal ROM Neurologic: Alert and oriented x 4.  GU: Neg CVAT.  PELVIC EXAM: Deferred, just had vaginal [place] 2 days ago, confirming dates  LAB RESULTS Results for orders placed or performed during the hospital encounter of 10/05/15 (from the past 24 hour(s))  Urinalysis, Routine w reflex microscopic (not at [facility])     Status: Abnormal   Collection Time: 10/05/15 12:45 PM  Result Value Ref Range   Color, Urine YELLOW YELLOW   APPearance HAZY (A) CLEAR   Specific Gravity, Urine >1.030 (H) 1.005 - 1.030   pH 5.5 5.0 - 8.0   Glucose, UA NEGATIVE NEGATIVE mg/dL   Hgb urine dipstick LARGE (A) NEGATIVE   Bilirubin Urine NEGATIVE NEGATIVE   Ketones, ur 15 (A) NEGATIVE mg/dL   Protein, ur NEGATIVE NEGATIVE mg/dL   Nitrite NEGATIVE NEGATIVE   Leukocytes, UA SMALL (A) NEGATIVE  Urine microscopic-add on     Status: Abnormal   Collection Time: 10/05/15 12:45 PM  Result Value Ref Range   Squamous Epithelial / LPF TOO NUMEROUS TO COUNT (A) NONE SEEN   WBC, UA 6-30 0 - 5 WBC/hpf   RBC / HPF 0-5 0 - 5 RBC/hpf   Bacteria, UA MANY (A) NONE SEEN   Urine-Other MUCOUS PRESENT   Pregnancy, urine POC     Status: Abnormal   Collection Time: 10/05/15  1:12 PM  Result Value Ref Range   Preg Test, Ur POSITIVE (A) NEGATIVE       IMAGING No results found.  MAU Management/MDM: Labs ordered as follows:  UA   IV hydration ordered:  D5LR bolus, LR with MVI infusion Responded well to  hydration Reglan given for nausea  >> excellent response  Consult Dr Julien Girt with presentation, exam findings, and results.      ASSESSMENT Nausea and vomiting of pregnancy  PLAN Discharge home Rx sent for Reglan  for nausea and vomiting.during the day May use Diclegis as prescribed Advance diet as tolerated Follow up in office for prenatal care    Medication List    ASK your doctor about these medications        calcipotriene-betamethasone external suspension  Commonly known as:  TACLONEX SCALP  Apply topically daily.     clobetasol 0.05 % external solution  Commonly known as:  TEMOVATE  Apply 1 application topically  2 (two) times daily as needed.     clobetasol cream 0.05 %  Commonly known as:  TEMOVATE  Apply 1 application topically 2 (two) times daily. As needed     diazepam 5 MG tablet  Commonly known as:  VALIUM  Take 1 tablet (5 mg total) by mouth every 6 (six) hours as needed. For air sickness     fluconazole 150 MG tablet  Commonly known as:  DIFLUCAN  Take 1 tablet (150 mg total) by mouth daily. As needed for vaginitis     Fluocinolone Acetonide 0.01 % Oil  Apply 1 application topically daily as needed.     fluticasone 50 MCG/ACT nasal spray  Commonly known as:  FLONASE  Place 2 sprays into both nostrils daily.     levonorgestrel 20 MCG/24HR IUD  Commonly known as:  MIRENA  1 each by Intrauterine route once. Reported on 08/24/2015     metroNIDAZOLE 500 MG tablet  Commonly known as:  FLAGYL  Take 1 tablet (500 mg total) by mouth 2 (two) times daily.     multivitamin tablet  Take 1 tablet by mouth daily.     mupirocin ointment 2 %  Commonly known as:  BACTROBAN  Apply topically 3 (three) times daily.     nystatin-triamcinolone ointment  Commonly known as:  MYCOLOG  Apply topically 2 (two) times daily.     ondansetron 4 MG tablet  Commonly known as:  ZOFRAN  Take 1 tablet (4 mg total) by mouth every 8 (eight) hours as needed for nausea or  vomiting.     promethazine 25 MG tablet  Commonly known as:  PHENERGAN     terconazole 80 MG vaginal suppository  Commonly known as:  TERAZOL 3        Pt stable at time of discharge. Encouraged to return here or to other Urgent Care/ED if she develops worsening of symptoms, increase in pain, fever, or other concerning symptoms.    Hansel Feinstein CNM, MSN Certified Nurse-Midwife 10/05/2015  1:35 PM

## 2015-10-05 NOTE — Discharge Instructions (Signed)
Morning Sickness Morning sickness is when you feel sick to your stomach (nauseous) during pregnancy. This nauseous feeling may or may not come with vomiting. It often occurs in the morning but can be a problem any time of day. Morning sickness is most common during the first trimester, but it may continue throughout pregnancy. While morning sickness is unpleasant, it is usually harmless unless you develop severe and continual vomiting (hyperemesis gravidarum). This condition requires more intense treatment.  CAUSES  The cause of morning sickness is not completely known but seems to be related to normal hormonal changes that occur in pregnancy. RISK FACTORS You are at greater risk if you:  Experienced nausea or vomiting before your pregnancy.  Had morning sickness during a previous pregnancy.  Are pregnant with more than one baby, such as twins. TREATMENT  Do not use any medicines (prescription, over-the-counter, or herbal) for morning sickness without first talking to your health care provider. Your health care provider may prescribe or recommend:  Vitamin B6 supplements.  Anti-nausea medicines.  The herbal medicine ginger. HOME CARE INSTRUCTIONS   Only take over-the-counter or prescription medicines as directed by your health care provider.  Taking multivitamins before getting pregnant can prevent or decrease the severity of morning sickness in most women.  Eat a piece of dry toast or unsalted crackers before getting out of bed in the morning.  Eat five or six small meals a day.  Eat dry and bland foods (rice, baked potato). Foods high in carbohydrates are often helpful.  Do not drink liquids with your meals. Drink liquids between meals.  Avoid greasy, fatty, and spicy foods.  Get someone to cook for you if the smell of any food causes nausea and vomiting.  If you feel nauseous after taking prenatal vitamins, take the vitamins at night or with a snack.  Snack on protein  foods (nuts, yogurt, cheese) between meals if you are hungry.  Eat unsweetened gelatins for desserts.  Wearing an acupressure wristband (worn for sea sickness) may be helpful.  Acupuncture may be helpful.  Do not smoke.  Get a humidifier to keep the air in your house free of odors.  Get plenty of fresh air. SEEK MEDICAL CARE IF:   Your home remedies are not working, and you need medicine.  You feel dizzy or lightheaded.  You are losing weight. SEEK IMMEDIATE MEDICAL CARE IF:   You have persistent and uncontrolled nausea and vomiting.  You pass out (faint). MAKE SURE YOU:  Understand these instructions.  Will watch your condition.  Will get help right away if you are not doing well or get worse.   This information is not intended to replace advice given to you by your health care provider. Make sure you discuss any questions you have with your health care provider.   Document Released: 07/26/2006 Document Revised: 06/09/2013 Document Reviewed: 11/19/2012 Elsevier Interactive Patient Education [employer].

## 2015-10-05 NOTE — MAU Note (Signed)
Pt states started to feel sick last Monday with nausea and vomiting.  Saw her provider Monday and was given diclegis, but medicine has not been working.  PT also states had diarrhea earlier this week but none recently.  Pt also complains of dizziness.

## 2015-10-06 MED FILL — METOCLOPRAMIDE 10 MG TABLET: 10 | 7 days supply | Qty: 30 | Fill #0

## 2015-10-11 DIAGNOSIS — Z36 Encounter for antenatal screening of mother: Secondary | ICD-10-CM | POA: Diagnosis not present

## 2015-10-11 LAB — OB RESULTS CONSOLE HEPATITIS B SURFACE ANTIGEN: HEP B S AG: NEGATIVE

## 2015-10-11 LAB — OB RESULTS CONSOLE ANTIBODY SCREEN: ANTIBODY SCREEN: NEGATIVE

## 2015-10-11 LAB — OB RESULTS CONSOLE RPR: RPR: NONREACTIVE

## 2015-10-11 LAB — OB RESULTS CONSOLE GBS: STREP GROUP B AG: POSITIVE

## 2015-10-11 LAB — OB RESULTS CONSOLE ABO/RH: RH Type: POSITIVE

## 2015-10-11 LAB — OB RESULTS CONSOLE GC/CHLAMYDIA
Chlamydia: NEGATIVE
Gonorrhea: NEGATIVE

## 2015-10-11 LAB — OB RESULTS CONSOLE RUBELLA ANTIBODY, IGM: RUBELLA: IMMUNE

## 2015-10-11 LAB — OB RESULTS CONSOLE HIV ANTIBODY (ROUTINE TESTING): HIV: NONREACTIVE

## 2015-10-19 MED FILL — AMOXICILLIN 250 MG/5 ML SUS: 250 | 5 days supply | Qty: 100 | Fill #0

## 2015-10-25 DIAGNOSIS — Z3491 Encounter for supervision of normal pregnancy, unspecified, first trimester: Secondary | ICD-10-CM | POA: Diagnosis not present

## 2015-10-25 DIAGNOSIS — Z113 Encounter for screening for infections with a predominantly sexual mode of transmission: Secondary | ICD-10-CM | POA: Diagnosis not present

## 2015-11-04 DIAGNOSIS — N760 Acute vaginitis: Secondary | ICD-10-CM | POA: Diagnosis not present

## 2015-11-04 DIAGNOSIS — Z3491 Encounter for supervision of normal pregnancy, unspecified, first trimester: Secondary | ICD-10-CM | POA: Diagnosis not present

## 2015-11-04 DIAGNOSIS — N390 Urinary tract infection, site not specified: Secondary | ICD-10-CM | POA: Diagnosis not present

## 2015-11-07 DIAGNOSIS — O09521 Supervision of elderly multigravida, first trimester: Secondary | ICD-10-CM | POA: Diagnosis not present

## 2015-11-07 DIAGNOSIS — Z3A12 12 weeks gestation of pregnancy: Secondary | ICD-10-CM | POA: Diagnosis not present

## 2015-12-14 MED FILL — METHYLPREDNISOLONE 4 MG TAB: 4 | 6 days supply | Qty: 21 | Fill #0

## 2015-12-22 DIAGNOSIS — Z36 Encounter for antenatal screening of mother: Secondary | ICD-10-CM | POA: Diagnosis not present

## 2015-12-22 DIAGNOSIS — Z3402 Encounter for supervision of normal first pregnancy, second trimester: Secondary | ICD-10-CM | POA: Diagnosis not present

## 2016-01-28 ENCOUNTER — Ambulatory Visit
Admission: EM | Admit: 2016-01-28 | Discharge: 2016-01-28 | Disposition: A | Discharge status: Home / Self Care | Payer: 59 | Attending: Emergency Medicine | Admitting: Emergency Medicine

## 2016-01-28 DIAGNOSIS — J029 Acute pharyngitis, unspecified: Secondary | ICD-10-CM

## 2016-01-28 LAB — RAPID STREP SCREEN (MED CTR MEBANE ONLY): STREPTOCOCCUS, GROUP A SCREEN (DIRECT): NEGATIVE
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
## 2016-01-28 NOTE — ED Triage Notes (Addendum)
Patient presents with a sore throat that has been irritated for the last 3 days. She complains of post nasal drainage. She is currently [redacted] weeks pregnant.

## 2016-01-31 ENCOUNTER — Telehealth: Payer: Self-pay

## 2016-01-31 LAB — CULTURE, GROUP A STREP (THRC)

## 2016-01-31 NOTE — Telephone Encounter (Signed)
-----   Message from Marylene Land, NP sent at 01/31/2016 11:49 AM EDT ----- Please inform strep culture negative.   Thanks,  [employer]

## 2016-02-23 DIAGNOSIS — D649 Anemia, unspecified: Secondary | ICD-10-CM | POA: Diagnosis not present

## 2016-02-23 DIAGNOSIS — Z3400 Encounter for supervision of normal first pregnancy, unspecified trimester: Secondary | ICD-10-CM | POA: Diagnosis not present

## 2016-02-23 DIAGNOSIS — Z36 Encounter for antenatal screening of mother: Secondary | ICD-10-CM | POA: Diagnosis not present

## 2016-02-29 ENCOUNTER — Inpatient Hospital Stay (HOSPITAL_COMMUNITY)
Admission: AD | Admit: 2016-02-29 | Discharge: 2016-02-29 | Disposition: A | Discharge status: Home / Self Care | Payer: 59 | Source: Ambulatory Visit | Attending: Obstetrics and Gynecology | Admitting: Obstetrics and Gynecology

## 2016-02-29 ENCOUNTER — Encounter (HOSPITAL_COMMUNITY): Payer: Self-pay | Admitting: *Deleted

## 2016-02-29 DIAGNOSIS — Z3A28 28 weeks gestation of pregnancy: Secondary | ICD-10-CM | POA: Insufficient documentation

## 2016-02-29 DIAGNOSIS — R109 Unspecified abdominal pain: Secondary | ICD-10-CM | POA: Diagnosis present

## 2016-02-29 DIAGNOSIS — O4703 False labor before 37 completed weeks of gestation, third trimester: Secondary | ICD-10-CM

## 2016-02-29 DIAGNOSIS — O26899 Other specified pregnancy related conditions, unspecified trimester: Secondary | ICD-10-CM

## 2016-02-29 LAB — URINALYSIS, ROUTINE W REFLEX MICROSCOPIC
Bilirubin Urine: NEGATIVE
Glucose, UA: NEGATIVE mg/dL
Hgb urine dipstick: NEGATIVE
Ketones, ur: NEGATIVE mg/dL
LEUKOCYTES UA: NEGATIVE
NITRITE: NEGATIVE
PH: 6 (ref 5.0–8.0)
Protein, ur: NEGATIVE mg/dL
SPECIFIC GRAVITY, URINE: 1.02 (ref 1.005–1.030)

## 2016-02-29 LAB — CBC
HEMATOCRIT: 31.6 % — AB (ref 36.0–46.0)
HEMOGLOBIN: 11 g/dL — AB (ref 12.0–15.0)
MCH: 30.4 pg (ref 26.0–34.0)
MCHC: 34.8 g/dL (ref 30.0–36.0)
MCV: 87.3 fL (ref 78.0–100.0)
Platelets: 241 10*3/uL (ref 150–400)
RBC: 3.62 MIL/uL — AB (ref 3.87–5.11)
RDW: 12.9 % (ref 11.5–15.5)
WBC: 8.3 10*3/uL (ref 4.0–10.5)

## 2016-02-29 LAB — FETAL FIBRONECTIN: Fetal Fibronectin: NEGATIVE

## 2016-02-29 MED ORDER — LACTATED RINGERS IV BOLUS (SEPSIS)
1000.0000 mL | Freq: Once | INTRAVENOUS | Status: AC
  Administered 2016-02-29 (×2): 1000 mL via INTRAVENOUS

## 2016-02-29 MED ORDER — NIFEDIPINE 10 MG PO CAPS
10.0000 mg | ORAL_CAPSULE | ORAL | Status: DC | PRN
  Administered 2016-02-29 (×3): 10 mg via ORAL
  Filled 2016-02-29 (×3): qty 1

## 2016-02-29 NOTE — MAU Provider Note (Signed)
History     CSN: [id number]  Arrival date and time: 02/29/16 1214   First Provider Initiated Contact with Patient 02/29/16 1258      Chief Complaint  Patient presents with  . Abdominal Pain   HPI Alicia Blair is a 40 y.o. G3P1001 at 1w3dwho presents with abdominal pain. Reports sudden onset abdominal pain that began at 1130 this morning. Pain is worse in the RLQ but alternates to LLQ as well. Pain is intermittent & sharp. Rates pain 9/10. Pain comes & goes every few minutes. Has not treated pain. Denies aggravating or alleviating factors. Denies n/v, fever, dysuria, vaginal bleeding, or LOF. Last BM 2 days ago; states bowel habit is every 2-5 days during this pregnancy. Denies recent intercourse. Positive fetal movement.   OB History    Gravida Para Term Preterm AB Living   3 1 1     1    SAB TAB Ectopic Multiple Live Births                  Past Medical History:  Diagnosis Date  . Asthma    as a child  . Chicken pox     Past Surgical History:  Procedure Laterality Date  . breast augmentation  2003  . LAPAROSCOPIC GASTRIC BANDING  2008    Family History  Problem Relation Age of Onset  . Diabetes Mother   . Cancer Mother 69   endometrial CA  . Diabetes Maternal Aunt   . Diabetes Maternal Grandmother   . Cancer Brother 21    testicular ca    Social History  Substance Use Topics  . Smoking status: Never Smoker  . Smokeless tobacco: Never Used  . Alcohol use No     Comment: social    Allergies:  Allergies  Allergen Reactions  . Codeine   . Erythromycin Nausea And Vomiting    Prescriptions Prior to Admission  Medication Sig Dispense Refill Last Dose  . Doxylamine-Pyridoxine (DICLEGIS) 10-10 MG TBEC Take 2 tablets by mouth daily.   Unknown at Unknown time  . metoCLOPramide (REGLAN) 10 MG tablet Take 1 tablet (10 mg total) by mouth every 6 (six) hours. 30 tablet 0 Unknown at Unknown time  . Multiple Vitamin (MULTIVITAMIN) tablet Take 1 tablet by mouth  daily.   01/28/2016 at Unknown time    Review of Systems  Constitutional: Negative.   Gastrointestinal: Positive for abdominal pain and constipation. Negative for diarrhea, nausea and vomiting.  Genitourinary: Negative.    Physical Exam   Blood pressure 117/68, pulse 85, temperature 98.5 F (36.9 C), temperature source Oral, resp. rate 16, last menstrual period 08/14/2015.  Physical Exam  Nursing note and vitals reviewed. Constitutional: She is oriented to person, place, and time. She appears well-developed and well-nourished. No distress.  HENT:  Head: Normocephalic and atraumatic.  Eyes: Conjunctivae are normal. Right eye exhibits no discharge. Left eye exhibits no discharge. No scleral icterus.  Neck: Normal range of motion.  Cardiovascular: Normal rate.   Respiratory: Effort normal. No respiratory distress.  GI: Soft. Bowel sounds are normal. There is no tenderness. There is no rebound and no guarding.  Ctx palpate mild during periods of abdominal pain  Musculoskeletal: Normal range of motion.  Neurological: She is alert and oriented to person, place, and time.  Skin: Skin is warm and dry. She is not diaphoretic.  Psychiatric: She has a normal mood and affect. Her behavior is normal. Judgment and thought content normal.   Dilation:  1 Effacement (%): Thick Cervical Position: Middle Station: -3 Exam by:: Robyne Askew, NP  Fetal Tracing:  Baseline: 125 Variability: moderate Accelerations: 15x15 Decelerations: none  Toco: UI, initially 1-2 mins; resolved by end of visit   MAU Course  Procedures Results for orders placed or performed during the hospital encounter of 02/29/16 (from the past 24 hour(s))  Urinalysis, Routine w reflex microscopic (not at [facility])     Status: None   Collection Time: 02/29/16 12:30 PM  Result Value Ref Range   Color, Urine YELLOW YELLOW   APPearance CLEAR CLEAR   Specific Gravity, Urine 1.020 1.005 - 1.030   pH 6.0 5.0 - 8.0   Glucose, UA  NEGATIVE NEGATIVE mg/dL   Hgb urine dipstick NEGATIVE NEGATIVE   Bilirubin Urine NEGATIVE NEGATIVE   Ketones, ur NEGATIVE NEGATIVE mg/dL   Protein, ur NEGATIVE NEGATIVE mg/dL   Nitrite NEGATIVE NEGATIVE   Leukocytes, UA NEGATIVE NEGATIVE  Fetal fibronectin     Status: None   Collection Time: 02/29/16  1:03 PM  Result Value Ref Range   Fetal Fibronectin NEGATIVE NEGATIVE  CBC     Status: Abnormal   Collection Time: 02/29/16  1:20 PM  Result Value Ref Range   WBC 8.3 4.0 - 10.5 K/uL   RBC 3.62 (L) 3.87 - 5.11 MIL/uL   Hemoglobin 11.0 (L) 12.0 - 15.0 g/dL   HCT 31.6 (L) 36.0 - 46.0 %   MCV 87.3 78.0 - 100.0 fL   MCH 30.4 26.0 - 34.0 pg   MCHC 34.8 30.0 - 36.0 g/dL   RDW 12.9 11.5 - 15.5 %   Platelets 241 150 - 400 K/uL    MDM Reactive fetal tracing CBC - no leukocytosis VSS UI noted on toco; IV LR bolus SVE 1/thick; FFN sent in FFN negative UI initially responded to IV fluid bolus but returned Procardia 10 mg every 20 mins x 3 doses -- UI subsided Cervix unchanged after >1 hr S/w Dr. Julien Girt; discharge home. Pt to f/u in office tomorrow morning  Assessment and Plan  A: 1. Preterm contractions, third trimester   2. Abdominal pain affecting pregnancy     P: Discharge home Call office for f/u appointment tomorrow Discussed reasons to return to [employer] 02/29/2016, 12:57 PM

## 2016-02-29 NOTE — MAU Note (Addendum)
Sudden onset RLQ pain, constant, sharp, crampy. Varies in intensity.

## 2016-02-29 NOTE — Discharge Instructions (Signed)
Abdominal Pain During Pregnancy Abdominal pain is common in pregnancy. Most of the time, it does not cause harm. There are many causes of abdominal pain. Some causes are more serious than others. Some of the causes of abdominal pain in pregnancy are easily diagnosed. Occasionally, the diagnosis takes time to understand. Other times, the cause is not determined. Abdominal pain can be a sign that something is very wrong with the pregnancy, or the pain may have nothing to do with the pregnancy at all. For this reason, always tell your health care provider if you have any abdominal discomfort. HOME CARE INSTRUCTIONS  Monitor your abdominal pain for any changes. The following actions may help to alleviate any discomfort you are experiencing:  Do not have sexual intercourse or put anything in your vagina until your symptoms go away completely.  Get plenty of rest until your pain improves.  Drink clear fluids if you feel nauseous. Avoid solid food as long as you are uncomfortable or nauseous.  Only take over-the-counter or prescription medicine as directed by your health care provider.  Keep all follow-up appointments with your health care provider. SEEK IMMEDIATE MEDICAL CARE IF:  You are bleeding, leaking fluid, or passing tissue from the vagina.  You have increasing pain or cramping.  You have persistent vomiting.  You have painful or bloody urination.  You have a fever.  You notice a decrease in your baby's movements.  You have extreme weakness or feel faint.  You have shortness of breath, with or without abdominal pain.  You develop a severe headache with abdominal pain.  You have abnormal vaginal discharge with abdominal pain.  You have persistent diarrhea.  You have abdominal pain that continues even after rest, or gets worse. MAKE SURE YOU:   Understand these instructions.  Will watch your condition.  Will get help right away if you are not doing well or get worse.     This information is not intended to replace advice given to you by your health care provider. Make sure you discuss any questions you have with your health care provider.   Document Released: 06/04/2005 Document Revised: 03/25/2013 Document Reviewed: 01/01/2013 Elsevier Interactive Patient Education 2016 [employer]. Preterm Labor Information Preterm labor is when labor starts at less than 37 weeks of pregnancy. The normal length of a pregnancy is 39 to 41 weeks. CAUSES Often, there is no identifiable underlying cause as to why a woman goes into preterm labor. One of the most common known causes of preterm labor is infection. Infections of the uterus, cervix, vagina, amniotic sac, bladder, kidney, or even the lungs (pneumonia) can cause labor to start. Other suspected causes of preterm labor include:   Urogenital infections, such as yeast infections and bacterial vaginosis.   Uterine abnormalities (uterine shape, uterine septum, fibroids, or bleeding from the placenta).   A cervix that has been operated on (it may fail to stay closed).   Malformations in the fetus.   Multiple gestations (twins, triplets, and so on).   Breakage of the amniotic sac.  RISK FACTORS  Having a previous history of preterm labor.   Having premature rupture of membranes (PROM).   Having a placenta that covers the opening of the cervix (placenta previa).   Having a placenta that separates from the uterus (placental abruption).   Having a cervix that is too weak to hold the fetus in the uterus (incompetent cervix).   Having too much fluid in the amniotic sac (polyhydramnios).   Taking  illegal drugs or smoking while pregnant.   Not gaining enough weight while pregnant.   Being younger than 89 and older than 40 years old.   Having a low socioeconomic status.   Being African American. SYMPTOMS Signs and symptoms of preterm labor include:   Menstrual-like cramps, abdominal pain, or  back pain.  Uterine contractions that are regular, as frequent as six in an hour, regardless of their intensity (may be mild or painful).  Contractions that start on the top of the uterus and spread down to the lower abdomen and back.   A sense of increased pelvic pressure.   A watery or bloody mucus discharge that comes from the vagina.  TREATMENT Depending on the length of the pregnancy and other circumstances, your health care provider may suggest bed rest. If necessary, there are medicines that can be given to stop contractions and to mature the fetal lungs. If labor happens before 34 weeks of pregnancy, a prolonged hospital stay may be recommended. Treatment depends on the condition of both you and the fetus.  WHAT SHOULD YOU DO IF YOU THINK YOU ARE IN PRETERM LABOR? Call your health care provider right away. You will need to go to the hospital to get checked immediately. HOW CAN YOU PREVENT PRETERM LABOR IN FUTURE PREGNANCIES? You should:   Stop smoking if you smoke.  Maintain healthy weight gain and avoid chemicals and drugs that are not necessary.  Be watchful for any type of infection.  Inform your health care provider if you have a known history of preterm labor.   This information is not intended to replace advice given to you by your health care provider. Make sure you discuss any questions you have with your health care provider.   Document Released: 08/25/2003 Document Revised: 02/04/2013 Document Reviewed: 07/07/2012 Elsevier Interactive Patient Education [employer].

## 2016-03-01 DIAGNOSIS — R87610 Atypical squamous cells of undetermined significance on cytologic smear of cervix (ASC-US): Secondary | ICD-10-CM | POA: Diagnosis not present

## 2016-03-08 DIAGNOSIS — Z3493 Encounter for supervision of normal pregnancy, unspecified, third trimester: Secondary | ICD-10-CM | POA: Diagnosis not present

## 2016-03-08 DIAGNOSIS — Z23 Encounter for immunization: Secondary | ICD-10-CM | POA: Diagnosis not present

## 2016-03-22 DIAGNOSIS — Z23 Encounter for immunization: Secondary | ICD-10-CM | POA: Diagnosis not present

## 2016-03-22 DIAGNOSIS — Z3A31 31 weeks gestation of pregnancy: Secondary | ICD-10-CM | POA: Diagnosis not present

## 2016-03-22 DIAGNOSIS — Z3400 Encounter for supervision of normal first pregnancy, unspecified trimester: Secondary | ICD-10-CM | POA: Diagnosis not present

## 2016-03-22 DIAGNOSIS — O26893 Other specified pregnancy related conditions, third trimester: Secondary | ICD-10-CM | POA: Diagnosis not present

## 2016-03-22 DIAGNOSIS — O3663X0 Maternal care for excessive fetal growth, third trimester, not applicable or unspecified: Secondary | ICD-10-CM | POA: Diagnosis not present

## 2016-04-04 ENCOUNTER — Other Ambulatory Visit (HOSPITAL_COMMUNITY): Payer: Self-pay | Admitting: Obstetrics and Gynecology

## 2016-04-04 DIAGNOSIS — Z3A34 34 weeks gestation of pregnancy: Secondary | ICD-10-CM

## 2016-04-04 DIAGNOSIS — O283 Abnormal ultrasonic finding on antenatal screening of mother: Secondary | ICD-10-CM

## 2016-04-04 DIAGNOSIS — Z3689 Encounter for other specified antenatal screening: Secondary | ICD-10-CM

## 2016-04-06 MED FILL — FERRALET 90 DUAL-IRON TAB: 90-1 | 30 days supply | Qty: 30 | Fill #0

## 2016-04-10 ENCOUNTER — Encounter (HOSPITAL_COMMUNITY): Payer: Self-pay

## 2016-04-11 ENCOUNTER — Ambulatory Visit (HOSPITAL_COMMUNITY)
Admission: RE | Admit: 2016-04-11 | Discharge: 2016-04-11 | Disposition: A | Discharge status: Home / Self Care | Payer: 59 | Source: Ambulatory Visit | Attending: Obstetrics and Gynecology | Admitting: Obstetrics and Gynecology

## 2016-04-11 ENCOUNTER — Encounter (HOSPITAL_COMMUNITY): Payer: Self-pay

## 2016-04-11 DIAGNOSIS — Z3689 Encounter for other specified antenatal screening: Secondary | ICD-10-CM

## 2016-04-11 DIAGNOSIS — O283 Abnormal ultrasonic finding on antenatal screening of mother: Secondary | ICD-10-CM

## 2016-04-11 DIAGNOSIS — O09293 Supervision of pregnancy with other poor reproductive or obstetric history, third trimester: Secondary | ICD-10-CM | POA: Insufficient documentation

## 2016-04-11 DIAGNOSIS — O99843 Bariatric surgery status complicating pregnancy, third trimester: Secondary | ICD-10-CM | POA: Insufficient documentation

## 2016-04-11 DIAGNOSIS — O09523 Supervision of elderly multigravida, third trimester: Secondary | ICD-10-CM | POA: Insufficient documentation

## 2016-04-11 DIAGNOSIS — Z363 Encounter for antenatal screening for malformations: Secondary | ICD-10-CM | POA: Diagnosis not present

## 2016-04-11 DIAGNOSIS — O358XX0 Maternal care for other (suspected) fetal abnormality and damage, not applicable or unspecified: Secondary | ICD-10-CM | POA: Insufficient documentation

## 2016-04-11 DIAGNOSIS — Z3A34 34 weeks gestation of pregnancy: Secondary | ICD-10-CM | POA: Diagnosis not present

## 2016-04-12 ENCOUNTER — Encounter (HOSPITAL_COMMUNITY): Payer: Self-pay

## 2016-04-12 ENCOUNTER — Other Ambulatory Visit (HOSPITAL_COMMUNITY): Payer: Self-pay

## 2016-04-13 DIAGNOSIS — Z3400 Encounter for supervision of normal first pregnancy, unspecified trimester: Secondary | ICD-10-CM | POA: Diagnosis not present

## 2016-04-13 DIAGNOSIS — Z3480 Encounter for supervision of other normal pregnancy, unspecified trimester: Secondary | ICD-10-CM | POA: Diagnosis not present

## 2016-04-13 DIAGNOSIS — D649 Anemia, unspecified: Secondary | ICD-10-CM | POA: Diagnosis not present

## 2016-04-18 DIAGNOSIS — Z3480 Encounter for supervision of other normal pregnancy, unspecified trimester: Secondary | ICD-10-CM | POA: Diagnosis not present

## 2016-04-18 DIAGNOSIS — Z3493 Encounter for supervision of normal pregnancy, unspecified, third trimester: Secondary | ICD-10-CM | POA: Diagnosis not present

## 2016-04-24 DIAGNOSIS — O26893 Other specified pregnancy related conditions, third trimester: Secondary | ICD-10-CM | POA: Diagnosis not present

## 2016-04-24 DIAGNOSIS — Z3493 Encounter for supervision of normal pregnancy, unspecified, third trimester: Secondary | ICD-10-CM | POA: Diagnosis not present

## 2016-04-24 DIAGNOSIS — O3663X0 Maternal care for excessive fetal growth, third trimester, not applicable or unspecified: Secondary | ICD-10-CM | POA: Diagnosis not present

## 2016-04-24 DIAGNOSIS — Z3A36 36 weeks gestation of pregnancy: Secondary | ICD-10-CM | POA: Diagnosis not present

## 2016-04-30 DIAGNOSIS — Z3400 Encounter for supervision of normal first pregnancy, unspecified trimester: Secondary | ICD-10-CM | POA: Diagnosis not present

## 2016-04-30 DIAGNOSIS — N760 Acute vaginitis: Secondary | ICD-10-CM | POA: Diagnosis not present

## 2016-05-03 ENCOUNTER — Encounter (HOSPITAL_COMMUNITY): Payer: Self-pay

## 2016-05-03 ENCOUNTER — Inpatient Hospital Stay (HOSPITAL_COMMUNITY)
Admission: AD | Admit: 2016-05-03 | Discharge: 2016-05-03 | Disposition: A | Discharge status: Home / Self Care | Payer: 59 | Source: Ambulatory Visit | Attending: Obstetrics and Gynecology | Admitting: Obstetrics and Gynecology

## 2016-05-03 DIAGNOSIS — Z3A37 37 weeks gestation of pregnancy: Secondary | ICD-10-CM | POA: Diagnosis not present

## 2016-05-03 DIAGNOSIS — R030 Elevated blood-pressure reading, without diagnosis of hypertension: Secondary | ICD-10-CM | POA: Insufficient documentation

## 2016-05-03 DIAGNOSIS — O26893 Other specified pregnancy related conditions, third trimester: Secondary | ICD-10-CM | POA: Diagnosis not present

## 2016-05-03 LAB — URINE MICROSCOPIC-ADD ON
BACTERIA UA: NONE SEEN
RBC / HPF: NONE SEEN RBC/hpf (ref 0–5)

## 2016-05-03 LAB — URINALYSIS, ROUTINE W REFLEX MICROSCOPIC
Bilirubin Urine: NEGATIVE
Glucose, UA: NEGATIVE mg/dL
Hgb urine dipstick: NEGATIVE
KETONES UR: NEGATIVE mg/dL
NITRITE: NEGATIVE
PH: 7 (ref 5.0–8.0)
Protein, ur: NEGATIVE mg/dL
SPECIFIC GRAVITY, URINE: 1.01 (ref 1.005–1.030)

## 2016-05-03 LAB — PROTEIN / CREATININE RATIO, URINE
Creatinine, Urine: 72 mg/dL
PROTEIN CREATININE RATIO: 0.1 mg/mg{creat} (ref 0.00–0.15)
TOTAL PROTEIN, URINE: 7 mg/dL

## 2016-05-03 LAB — CBC
HCT: 32.6 % — ABNORMAL LOW (ref 36.0–46.0)
HEMOGLOBIN: 11 g/dL — AB (ref 12.0–15.0)
MCH: 29.8 pg (ref 26.0–34.0)
MCHC: 33.7 g/dL (ref 30.0–36.0)
MCV: 88.3 fL (ref 78.0–100.0)
PLATELETS: 211 10*3/uL (ref 150–400)
RBC: 3.69 MIL/uL — ABNORMAL LOW (ref 3.87–5.11)
RDW: 15.5 % (ref 11.5–15.5)
WBC: 5.2 10*3/uL (ref 4.0–10.5)

## 2016-05-03 LAB — COMPREHENSIVE METABOLIC PANEL
ALBUMIN: 3 g/dL — AB (ref 3.5–5.0)
ALK PHOS: 59 U/L (ref 38–126)
ALT: 10 U/L — ABNORMAL LOW (ref 14–54)
ANION GAP: 6 (ref 5–15)
AST: 17 U/L (ref 15–41)
BUN: <5 mg/dL — ABNORMAL LOW (ref 6–20)
CHLORIDE: 108 mmol/L (ref 101–111)
CO2: 22 mmol/L (ref 22–32)
Calcium: 8.6 mg/dL — ABNORMAL LOW (ref 8.9–10.3)
Creatinine, Ser: 0.41 mg/dL — ABNORMAL LOW (ref 0.44–1.00)
GFR calc Af Amer: >60 mL/min (ref >60)
GFR calc non Af Amer: >60 mL/min (ref >60)
GLUCOSE: 76 mg/dL (ref 65–99)
POTASSIUM: 3.8 mmol/L (ref 3.5–5.1)
SODIUM: 136 mmol/L (ref 135–145)
Total Bilirubin: 0.3 mg/dL (ref 0.3–1.2)
Total Protein: 6.2 g/dL — ABNORMAL LOW (ref 6.5–8.1)

## 2016-05-03 LAB — URIC ACID: Uric Acid, Serum: 3.8 mg/dL (ref 2.3–6.6)

## 2016-05-03 LAB — LACTATE DEHYDROGENASE: LDH: 107 U/L (ref 98–192)

## 2016-05-03 NOTE — MAU Note (Signed)
Pt sent from office for PIH eval. Denies Headache or pain. Stated she has had spots but that is nothing new.

## 2016-05-03 NOTE — Discharge Instructions (Signed)
Hypertension During Pregnancy Hypertension, commonly called high blood pressure, is when the force of blood pumping through your arteries is too strong. Arteries are blood vessels that carry blood from the heart throughout the body. Hypertension during pregnancy can cause problems for you and your baby. Your baby may be born early (prematurely) or may not weigh as much as he or she should at birth. Very bad cases of hypertension during pregnancy can be life-threatening. Different types of hypertension can occur during pregnancy. These include:  Chronic hypertension. This happens when:  You have hypertension before pregnancy and it continues during pregnancy.  You develop hypertension before you are [redacted] weeks pregnant, and it continues during pregnancy.  Gestational hypertension. This is hypertension that develops after the 20th week of pregnancy.  Preeclampsia, also called toxemia of pregnancy. This is a very serious type of hypertension that develops only during pregnancy. It affects the whole body, and it can be very dangerous for you and your baby. Gestational hypertension and preeclampsia usually go away within 6 weeks after your baby is born. Women who have hypertension during pregnancy have a greater chance of developing hypertension later in life or during future pregnancies. What are the causes? The exact cause of hypertension is not known. What increases the risk? There are certain factors that make it more likely for you to develop hypertension during pregnancy. These include:  Having hypertension during a previous pregnancy or prior to pregnancy.  Being overweight.  Being older than age 10.  Being pregnant for the first time or being pregnant with more than one baby.  Becoming pregnant using fertilization methods such as IVF (in vitro fertilization).  Having diabetes, kidney problems, or systemic lupus erythematosus.  Having a family history of hypertension. What are the  signs or symptoms? Chronic hypertension and gestational hypertension rarely cause symptoms. Preeclampsia causes symptoms, which may include:  Increased protein in your urine. Your health care provider will check for this at every visit before you give birth (prenatal visit).  Severe headaches.  Sudden weight gain.  Swelling of the hands, face, legs, and feet.  Nausea and vomiting.  Vision problems, such as blurred or double vision.  Numbness in the face, arms, legs, and feet.  Dizziness.  Slurred speech.  Sensitivity to bright lights.  Abdominal pain.  Convulsions. How is this diagnosed? You may be diagnosed with hypertension during a routine prenatal exam. At each prenatal visit, you may:  Have a urine test to check for high amounts of protein in your urine.  Have your blood pressure checked. A blood pressure reading is recorded as two numbers, such as "120 over 80" (or 120/80). The first ("top") number is called the systolic pressure. It is a measure of the pressure in your arteries when your heart beats. The second ("bottom") number is called the diastolic pressure. It is a measure of the pressure in your arteries as your heart relaxes between beats. Blood pressure is measured in a unit called mm Hg. A normal blood pressure reading is:  Systolic: below 287.  Diastolic: below 80. The type of hypertension that you are diagnosed with depends on your test results and when your symptoms developed.  Chronic hypertension is usually diagnosed before 20 weeks of pregnancy.  Gestational hypertension is usually diagnosed after 20 weeks of pregnancy.  Hypertension with high amounts of protein in the urine is diagnosed as preeclampsia.  Blood pressure measurements that stay above 681 systolic, or above 157 diastolic, are signs of severe preeclampsia.  How is this treated? Treatment for hypertension during pregnancy varies depending on the type of hypertension you have and how  serious it is.  If you take medicines called ACE inhibitors to treat chronic hypertension, you may need to switch medicines. ACE inhibitors should not be taken during pregnancy.  If you have gestational hypertension, you may need to take blood pressure medicine.  If you are at risk for preeclampsia, your health care provider may recommend that you take a low-dose aspirin every day to prevent high blood pressure during your pregnancy.  If you have severe preeclampsia, you may need to be hospitalized so you and your baby can be monitored closely. You may also need to take medicine (magnesium sulfate) to prevent seizures and to lower blood pressure. This medicine may be given as an injection or through an IV tube.  In some cases, if your condition gets worse, you may need to deliver your baby early. Follow these instructions at home: Eating and drinking  Drink enough fluid to keep your urine clear or pale yellow.  Eat a healthy diet that is low in salt (sodium). Do not add salt to your food. Check food labels to see how much sodium a food or beverage contains. Lifestyle  Do not use any products that contain nicotine or tobacco, such as cigarettes and e-cigarettes. If you need help quitting, ask your health care provider.  Do not use alcohol.  Avoid caffeine.  Avoid stress as much as possible. Rest and get plenty of sleep. General instructions  Take over-the-counter and prescription medicines only as told by your health care provider.  While lying down, lie on your left side. This keeps pressure off your baby.  While sitting or lying down, raise (elevate) your feet. Try putting some pillows under your lower legs.  Exercise regularly. Ask your health care provider what kinds of exercise are best for you.  Keep all prenatal and follow-up visits as told by your health care provider. This is important. Contact a health care provider if:  You have symptoms that your health care provider  told you may require more treatment or monitoring, such as:  Fever.  Vomiting.  Headache. Get help right away if:  You have severe abdominal pain or vomiting that does not get better with treatment.  You suddenly develop swelling in your hands, ankles, or face.  You gain 4 lbs (1.8 kg) or more in 1 week.  You develop vaginal bleeding, or you have blood in your urine.  You do not feel your baby moving as much as usual.  You have blurred or double vision.  You have muscle twitching or sudden tightening (spasms).  You have shortness of breath.  Your lips or fingernails turn blue. This information is not intended to replace advice given to you by your health care provider. Make sure you discuss any questions you have with your health care provider. Document Released: 02/20/2011 Document Revised: 12/23/2015 Document Reviewed: 11/18/2015 Elsevier Interactive Patient Education  2017 [employer].

## 2016-05-03 NOTE — MAU Provider Note (Signed)
History     CSN: [id number]  Arrival date and time: 05/03/16 1120   First Provider Initiated Contact with Patient 05/03/16 1208      Chief Complaint  Patient presents with  . Hypertension   HPI Alicia Blair is a 40 y.o. G2P1001 at 58w4dwho [facility] Was in office today for ROV & had DBP >90. Reports history of gestational hypertension with previous pregnancy 10 years ago but no htn since then. Denies headache, chest pain, epigastric pain, n/v, abdominal pain, or vaginal bleeding. Positive fetal movement. Endorses significant increase in BLE for the last week.   OB History    Gravida Para Term Preterm AB Living   2 1 1     1    SAB TAB Ectopic Multiple Live Births                  Past Medical History:  Diagnosis Date  . Asthma    as a child  . Chicken pox     Past Surgical History:  Procedure Laterality Date  . breast augmentation  2003  . LAPAROSCOPIC GASTRIC BANDING  2008    Family History  Problem Relation Age of Onset  . Diabetes Mother   . Cancer Mother 656   endometrial CA  . Diabetes Maternal Aunt   . Diabetes Maternal Grandmother   . Cancer Brother 21    testicular ca    Social History  Substance Use Topics  . Smoking status: Never Smoker  . Smokeless tobacco: Never Used  . Alcohol use No     Comment: social    Allergies:  Allergies  Allergen Reactions  . Codeine Nausea And Vomiting  . Erythromycin Nausea And Vomiting    Prescriptions Prior to Admission  Medication Sig Dispense Refill Last Dose  . Fe Cbn-Fe Gluc-FA-B12-C-DSS (FERRALET 90) 90-1 MG TABS Take 1 capsule by mouth daily.   05/02/2016 at Unknown time  . Prenatal Vit-Fe Fumarate-FA (PRENATAL MULTIVITAMIN) TABS tablet Take 1 tablet by mouth daily.    05/03/2016 at Unknown time    Review of Systems  Constitutional: Negative.   Cardiovascular: Positive for leg swelling.  Gastrointestinal: Negative.   Neurological: Negative for dizziness and  headaches.   Physical Exam   Blood pressure 129/69, pulse 87, temperature 98.1 F (36.7 C), temperature source Oral, resp. rate 18, height 5' 7"  (1.702 m), weight 218 lb 1.9 oz (98.9 kg), last menstrual period 08/14/2015.  Temp:  [98.1 F (36.7 C)] 98.1 F (36.7 C) (11/16 1129) Pulse Rate:  [69-90] 69 (11/16 1323) Resp:  [17-18] 17 (11/16 1323) BP: (108-133)/(58-88) 108/58 (11/16 1323) SpO2:  [99 %] 99 % (11/16 1323) Weight:  [218 lb 1.9 oz (98.9 kg)] 218 lb 1.9 oz (98.9 kg) (11/16 1129)  Physical Exam  Nursing note and vitals reviewed. Constitutional: She is oriented to person, place, and time. She appears well-developed and well-nourished. No distress.  HENT:  Head: Normocephalic and atraumatic.  Eyes: Conjunctivae are normal. Right eye exhibits no discharge. Left eye exhibits no discharge. No scleral icterus.  Neck: Normal range of motion.  Cardiovascular: Normal rate, regular rhythm and normal heart sounds.   No murmur heard. Respiratory: Effort normal and breath sounds normal. No respiratory distress. She has no wheezes.  GI: Soft. There is no tenderness.  Musculoskeletal: She exhibits edema (2+ pitting edema of BLE).  Neurological: She is alert and oriented to person, place, and time. She has normal reflexes.  No clonus  Skin: Skin is warm and dry. She is not diaphoretic.  Psychiatric: She has a normal mood and affect. Her behavior is normal. Judgment and thought content normal.   Fetal Tracing:  Baseline: 125 Variability: moderate Accelerations: 15x15 Decelerations: none  Toco: irregular ctx & UI   MAU Course  Procedures Results for orders placed or performed during the hospital encounter of 05/03/16 (from the past 24 hour(s))  Urinalysis, Routine w reflex microscopic (not at [facility])     Status: Abnormal   Collection Time: 05/03/16 12:15 PM  Result Value Ref Range   Color, Urine YELLOW YELLOW   APPearance HAZY (A) CLEAR   Specific Gravity, Urine 1.010 1.005 -  1.030   pH 7.0 5.0 - 8.0   Glucose, UA NEGATIVE NEGATIVE mg/dL   Hgb urine dipstick NEGATIVE NEGATIVE   Bilirubin Urine NEGATIVE NEGATIVE   Ketones, ur NEGATIVE NEGATIVE mg/dL   Protein, ur NEGATIVE NEGATIVE mg/dL   Nitrite NEGATIVE NEGATIVE   Leukocytes, UA SMALL (A) NEGATIVE  Protein / creatinine ratio, urine     Status: None   Collection Time: 05/03/16 12:15 PM  Result Value Ref Range   Creatinine, Urine 72.00 mg/dL   Total Protein, Urine 7 mg/dL   Protein Creatinine Ratio 0.10 0.00 - 0.15 mg/mg[Cre]  Urine microscopic-add on     Status: Abnormal   Collection Time: 05/03/16 12:15 PM  Result Value Ref Range   Squamous Epithelial / LPF 6-30 (A) NONE SEEN   WBC, UA 0-5 0 - 5 WBC/hpf   RBC / HPF NONE SEEN 0 - 5 RBC/hpf   Bacteria, UA NONE SEEN NONE SEEN  CBC     Status: Abnormal   Collection Time: 05/03/16 12:31 PM  Result Value Ref Range   WBC 5.2 4.0 - 10.5 K/uL   RBC 3.69 (L) 3.87 - 5.11 MIL/uL   Hemoglobin 11.0 (L) 12.0 - 15.0 g/dL   HCT 32.6 (L) 36.0 - 46.0 %   MCV 88.3 78.0 - 100.0 fL   MCH 29.8 26.0 - 34.0 pg   MCHC 33.7 30.0 - 36.0 g/dL   RDW 15.5 11.5 - 15.5 %   Platelets 211 150 - 400 K/uL  Comprehensive metabolic panel     Status: Abnormal   Collection Time: 05/03/16 12:31 PM  Result Value Ref Range   Sodium 136 135 - 145 mmol/L   Potassium 3.8 3.5 - 5.1 mmol/L   Chloride 108 101 - 111 mmol/L   CO2 22 22 - 32 mmol/L   Glucose, Bld 76 65 - 99 mg/dL   BUN <5 (L) 6 - 20 mg/dL   Creatinine, Ser 0.41 (L) 0.44 - 1.00 mg/dL   Calcium 8.6 (L) 8.9 - 10.3 mg/dL   Total Protein 6.2 (L) 6.5 - 8.1 g/dL   Albumin 3.0 (L) 3.5 - 5.0 g/dL   AST 17 15 - 41 U/L   ALT 10 (L) 14 - 54 U/L   Alkaline Phosphatase 59 38 - 126 U/L   Total Bilirubin 0.3 0.3 - 1.2 mg/dL   GFR calc non Af Amer >60 >60 mL/min   GFR calc Af Amer >60 >60 mL/min   Anion gap 6 5 - 15  Lactate dehydrogenase     Status: None   Collection Time: 05/03/16 12:31 PM  Result Value Ref Range   LDH 107 98 -  192 U/L  Uric acid     Status: None   Collection Time: 05/03/16 12:31 PM  Result Value Ref Range   Uric  Acid, Serum 3.8 2.3 - 6.6 mg/dL    MDM Category 1 tracing No elevated BPs during this visit CBC, CMP, LDH, uric acid, urine PCR S/w Dr. Julien Girt regarding labs & VS. Ok to discharge home  Assessment and Plan  A; 1. Elevated BP without diagnosis of hypertension    P: Discharge home Strict return precautions for s/s of preeclampsia Keep f/u with OB next week  Jorje Guild 05/03/2016, 12:07 PM

## 2016-05-04 ENCOUNTER — Telehealth (HOSPITAL_COMMUNITY): Payer: Self-pay | Admitting: *Deleted

## 2016-05-04 ENCOUNTER — Encounter (HOSPITAL_COMMUNITY): Payer: Self-pay | Admitting: *Deleted

## 2016-05-04 NOTE — Telephone Encounter (Signed)
Preadmission screen  

## 2016-05-13 ENCOUNTER — Inpatient Hospital Stay (EMERGENCY_DEPARTMENT_HOSPITAL)
Admission: AD | Admit: 2016-05-13 | Discharge: 2016-05-13 | Disposition: A | Discharge status: Home / Self Care | Payer: 59 | Source: Ambulatory Visit | Attending: Obstetrics and Gynecology | Admitting: Obstetrics and Gynecology

## 2016-05-13 ENCOUNTER — Encounter (HOSPITAL_COMMUNITY): Payer: Self-pay | Admitting: *Deleted

## 2016-05-13 DIAGNOSIS — Z3A39 39 weeks gestation of pregnancy: Secondary | ICD-10-CM | POA: Diagnosis not present

## 2016-05-13 DIAGNOSIS — O99824 Streptococcus B carrier state complicating childbirth: Secondary | ICD-10-CM | POA: Diagnosis not present

## 2016-05-13 DIAGNOSIS — O133 Gestational [pregnancy-induced] hypertension without significant proteinuria, third trimester: Secondary | ICD-10-CM

## 2016-05-13 DIAGNOSIS — Z3493 Encounter for supervision of normal pregnancy, unspecified, third trimester: Secondary | ICD-10-CM | POA: Diagnosis not present

## 2016-05-13 DIAGNOSIS — Z833 Family history of diabetes mellitus: Secondary | ICD-10-CM | POA: Diagnosis not present

## 2016-05-13 DIAGNOSIS — O163 Unspecified maternal hypertension, third trimester: Secondary | ICD-10-CM

## 2016-05-13 LAB — CBC
HEMATOCRIT: 34.8 % — AB (ref 36.0–46.0)
Hemoglobin: 12 g/dL (ref 12.0–15.0)
MCH: 30.3 pg (ref 26.0–34.0)
MCHC: 34.5 g/dL (ref 30.0–36.0)
MCV: 87.9 fL (ref 78.0–100.0)
Platelets: 225 10*3/uL (ref 150–400)
RBC: 3.96 MIL/uL (ref 3.87–5.11)
RDW: 15.3 % (ref 11.5–15.5)
WBC: 6.2 10*3/uL (ref 4.0–10.5)

## 2016-05-13 LAB — COMPREHENSIVE METABOLIC PANEL
ALT: 10 U/L — ABNORMAL LOW (ref 14–54)
ANION GAP: 9 (ref 5–15)
AST: 19 U/L (ref 15–41)
Albumin: 3.1 g/dL — ABNORMAL LOW (ref 3.5–5.0)
Alkaline Phosphatase: 69 U/L (ref 38–126)
BILIRUBIN TOTAL: 0.4 mg/dL (ref 0.3–1.2)
BUN: 5 mg/dL — ABNORMAL LOW (ref 6–20)
CO2: 19 mmol/L — ABNORMAL LOW (ref 22–32)
Calcium: 8.9 mg/dL (ref 8.9–10.3)
Chloride: 107 mmol/L (ref 101–111)
Creatinine, Ser: 0.46 mg/dL (ref 0.44–1.00)
Glucose, Bld: 73 mg/dL (ref 65–99)
POTASSIUM: 3.9 mmol/L (ref 3.5–5.1)
Sodium: 135 mmol/L (ref 135–145)
TOTAL PROTEIN: 6.5 g/dL (ref 6.5–8.1)

## 2016-05-13 LAB — PROTEIN / CREATININE RATIO, URINE
CREATININE, URINE: 155 mg/dL
PROTEIN CREATININE RATIO: 0.08 mg/mg{creat} (ref 0.00–0.15)
TOTAL PROTEIN, URINE: 13 mg/dL

## 2016-05-13 NOTE — MAU Provider Note (Signed)
Chief Complaint:  No chief complaint on file.   First Provider Initiated Contact with Patient 05/13/16 2019      HPI: Alicia Blair is a 40 y.o. G2P1001 at 4w0dwho presents to maternity admissions reporting headache and elevated BP. She reports elevated BP for the first time in this pregnancy 1 week ago, which was 140s/90s.  She woke up today with a h/a and took Tylenol, which resolved the h/a. Then she took a nap but woke up again with a h/a.  The h/a is gradually improving and is now 1/10 on pain scale. She took her BP at home when the h/a returned and it was 130s/90s so she call the office and was told to come to MAU for evaluation.  She has IOL scheduled tomorrow morning at 6 am and if everything is good tonight, she prefers to come back tomorrow as scheduled. She does, however, report that she wants to do what is safest for the baby and for herself. She reports good fetal movement, denies LOF, vaginal bleeding, vaginal itching/burning, urinary symptoms, h/a, dizziness, n/v, or fever/chills.    HPI  Past Medical History: Past Medical History:  Diagnosis Date  . Asthma    as a child  . Chicken pox   . Complication of anesthesia   . PONV (postoperative nausea and vomiting)     Past obstetric history: OB History  Gravida Para Term Preterm AB Living  2 1 1     1   SAB TAB Ectopic Multiple Live Births               # Outcome Date GA Lbr Len/2nd Weight Sex Delivery Anes PTL Lv  2 Current           1 Term      Vag-Spont         Past Surgical History: Past Surgical History:  Procedure Laterality Date  . breast augmentation  2003  . LAPAROSCOPIC GASTRIC BANDING  2008    Family History: Family History  Problem Relation Age of Onset  . Diabetes Mother   . Cancer Mother 638   endometrial CA  . Diabetes Maternal Aunt   . Diabetes Maternal Grandmother   . Cancer Brother 21    testicular ca  . Diabetes Brother     Social History: Social History  Substance Use Topics  .  Smoking status: Never Smoker  . Smokeless tobacco: Never Used  . Alcohol use No     Comment: social    Allergies:  Allergies  Allergen Reactions  . Codeine Nausea And Vomiting  . Erythromycin Nausea And Vomiting    Meds:  Prescriptions Prior to Admission  Medication Sig Dispense Refill Last Dose  . acetaminophen (TYLENOL) 500 MG chewable tablet Chew 1,000 mg by mouth every 6 (six) hours as needed for pain.   05/13/2016 at Unknown time  . Fe Cbn-Fe Gluc-FA-B12-C-DSS (FERRALET 90) 90-1 MG TABS Take 1 capsule by mouth daily.   Past Week at Unknown time  . Prenatal Vit-Fe Fumarate-FA (PRENATAL MULTIVITAMIN) TABS tablet Take 1 tablet by mouth daily.    05/12/2016 at Unknown time    ROS:  Review of Systems  Constitutional: Negative for chills, fatigue and fever.  Eyes: Negative for visual disturbance.  Respiratory: Negative for shortness of breath.   Cardiovascular: Negative for chest pain.  Gastrointestinal: Negative for abdominal pain, nausea and vomiting.  Genitourinary: Negative for difficulty urinating, dysuria, flank pain, pelvic pain, vaginal bleeding, vaginal discharge and  vaginal pain.  Neurological: Positive for headaches. Negative for dizziness.  Psychiatric/Behavioral: Negative.      I have reviewed patient's Past Medical Hx, Surgical Hx, Family Hx, Social Hx, medications and allergies.   Physical Exam   Patient Vitals for the past 24 hrs:  BP Temp Temp src Pulse Resp SpO2 Height Weight  05/13/16 2001 121/86 - - - - - - -  05/13/16 1953 137/84 - - - - - - -  05/13/16 1941 129/93 - - - - - - -  05/13/16 1939 130/88 - - 70 - - - -  05/13/16 1917 131/87 98.4 F (36.9 C) Oral 80 16 100 % 5' 7"  (1.702 m) 217 lb (98.4 kg)   Constitutional: Well-developed, well-nourished female in no acute distress.  HEART: normal rate, heart sounds, regular rhythm RESP: normal effort, lung sounds clear and equal bilaterally GI: Abd soft, non-tender, gravid appropriate for gestational  age.  MS: Extremities nontender, no edema, normal ROM Neurologic: Alert and oriented x 4.  GU: Neg CVAT.  PELVIC EXAM: Deferred     FHT:  Baseline 135, moderate variability, accelerations present, no decelerations Contractions: rare, mild to palpation   Labs: Results for orders placed or performed during the hospital encounter of 05/13/16 (from the past 24 hour(s))  Protein / creatinine ratio, urine     Status: None   Collection Time: 05/13/16  7:18 PM  Result Value Ref Range   Creatinine, Urine 155.00 mg/dL   Total Protein, Urine 13 mg/dL   Protein Creatinine Ratio 0.08 0.00 - 0.15 mg/mg[Cre]  CBC     Status: Abnormal   Collection Time: 05/13/16  8:15 PM  Result Value Ref Range   WBC 6.2 4.0 - 10.5 K/uL   RBC 3.96 3.87 - 5.11 MIL/uL   Hemoglobin 12.0 12.0 - 15.0 g/dL   HCT 34.8 (L) 36.0 - 46.0 %   MCV 87.9 78.0 - 100.0 fL   MCH 30.3 26.0 - 34.0 pg   MCHC 34.5 30.0 - 36.0 g/dL   RDW 15.3 11.5 - 15.5 %   Platelets 225 150 - 400 K/uL  Comprehensive metabolic panel     Status: Abnormal   Collection Time: 05/13/16  8:15 PM  Result Value Ref Range   Sodium 135 135 - 145 mmol/L   Potassium 3.9 3.5 - 5.1 mmol/L   Chloride 107 101 - 111 mmol/L   CO2 19 (L) 22 - 32 mmol/L   Glucose, Bld 73 65 - 99 mg/dL   BUN 5 (L) 6 - 20 mg/dL   Creatinine, Ser 0.46 0.44 - 1.00 mg/dL   Calcium 8.9 8.9 - 10.3 mg/dL   Total Protein 6.5 6.5 - 8.1 g/dL   Albumin 3.1 (L) 3.5 - 5.0 g/dL   AST 19 15 - 41 U/L   ALT 10 (L) 14 - 54 U/L   Alkaline Phosphatase 69 38 - 126 U/L   Total Bilirubin 0.4 0.3 - 1.2 mg/dL   GFR calc non Af Amer >60 >60 mL/min   GFR calc Af Amer >60 >60 mL/min   Anion gap 9 5 - 15   A/Positive/-- (04/25 0000)  Imaging:  No results found.  MAU Course/MDM: I have ordered labs and reviewed results.  NST reviewed Consult Dr Corinna Capra with presentation, exam findings and test results.  No evidence of preeclampsia tonight, headache resolved.  Pt to return tomorrow for scheduled  IOL. Pt stable at time of discharge.  Assessment: 1. Elevated blood pressure affecting pregnancy in third  trimester, antepartum     Plan: Discharge home Labor precautions, preeclampsia precautions, and fetal kick counts  Follow-up Information    ADKINS,GRETCHEN, MD Follow up.   Specialty:  Obstetrics and Gynecology Why:  Return to [facility] for your scheduled induction tomorrow morning.  Return to MAU sooner for signs of labor or emergencies. Contact information: [address], [address] [place] [address] [phone]            Medication List    TAKE these medications   acetaminophen 500 MG chewable tablet Commonly known as:  TYLENOL Chew 1,000 mg by mouth every 6 (six) hours as needed for pain.   FERRALET 90 90-1 MG Tabs Take 1 capsule by mouth daily.   prenatal multivitamin Tabs tablet Take 1 tablet by mouth daily.       Fatima Blank Certified Nurse-Midwife 05/13/2016 10:00 PM

## 2016-05-13 NOTE — Discharge Instructions (Signed)
Hypertension During Pregnancy Hypertension, commonly called high blood pressure, is when the force of blood pumping through your arteries is too strong. Arteries are blood vessels that carry blood from the heart throughout the body. Hypertension during pregnancy can cause problems for you and your baby. Your baby may be born early (prematurely) or may not weigh as much as he or she should at birth. Very bad cases of hypertension during pregnancy can be life-threatening. Different types of hypertension can occur during pregnancy. These include:  Chronic hypertension. This happens when:  You have hypertension before pregnancy and it continues during pregnancy.  You develop hypertension before you are [redacted] weeks pregnant, and it continues during pregnancy.  Gestational hypertension. This is hypertension that develops after the 20th week of pregnancy.  Preeclampsia, also called toxemia of pregnancy. This is a very serious type of hypertension that develops only during pregnancy. It affects the whole body, and it can be very dangerous for you and your baby. Gestational hypertension and preeclampsia usually go away within 6 weeks after your baby is born. Women who have hypertension during pregnancy have a greater chance of developing hypertension later in life or during future pregnancies. What are the causes? The exact cause of hypertension is not known. What increases the risk? There are certain factors that make it more likely for you to develop hypertension during pregnancy. These include:  Having hypertension during a previous pregnancy or prior to pregnancy.  Being overweight.  Being older than age 10.  Being pregnant for the first time or being pregnant with more than one baby.  Becoming pregnant using fertilization methods such as IVF (in vitro fertilization).  Having diabetes, kidney problems, or systemic lupus erythematosus.  Having a family history of hypertension. What are the  signs or symptoms? Chronic hypertension and gestational hypertension rarely cause symptoms. Preeclampsia causes symptoms, which may include:  Increased protein in your urine. Your health care provider will check for this at every visit before you give birth (prenatal visit).  Severe headaches.  Sudden weight gain.  Swelling of the hands, face, legs, and feet.  Nausea and vomiting.  Vision problems, such as blurred or double vision.  Numbness in the face, arms, legs, and feet.  Dizziness.  Slurred speech.  Sensitivity to bright lights.  Abdominal pain.  Convulsions. How is this diagnosed? You may be diagnosed with hypertension during a routine prenatal exam. At each prenatal visit, you may:  Have a urine test to check for high amounts of protein in your urine.  Have your blood pressure checked. A blood pressure reading is recorded as two numbers, such as "120 over 80" (or 120/80). The first ("top") number is called the systolic pressure. It is a measure of the pressure in your arteries when your heart beats. The second ("bottom") number is called the diastolic pressure. It is a measure of the pressure in your arteries as your heart relaxes between beats. Blood pressure is measured in a unit called mm Hg. A normal blood pressure reading is:  Systolic: below 287.  Diastolic: below 80. The type of hypertension that you are diagnosed with depends on your test results and when your symptoms developed.  Chronic hypertension is usually diagnosed before 20 weeks of pregnancy.  Gestational hypertension is usually diagnosed after 20 weeks of pregnancy.  Hypertension with high amounts of protein in the urine is diagnosed as preeclampsia.  Blood pressure measurements that stay above 681 systolic, or above 157 diastolic, are signs of severe preeclampsia.  How is this treated? Treatment for hypertension during pregnancy varies depending on the type of hypertension you have and how  serious it is.  If you take medicines called ACE inhibitors to treat chronic hypertension, you may need to switch medicines. ACE inhibitors should not be taken during pregnancy.  If you have gestational hypertension, you may need to take blood pressure medicine.  If you are at risk for preeclampsia, your health care provider may recommend that you take a low-dose aspirin every day to prevent high blood pressure during your pregnancy.  If you have severe preeclampsia, you may need to be hospitalized so you and your baby can be monitored closely. You may also need to take medicine (magnesium sulfate) to prevent seizures and to lower blood pressure. This medicine may be given as an injection or through an IV tube.  In some cases, if your condition gets worse, you may need to deliver your baby early. Follow these instructions at home: Eating and drinking  Drink enough fluid to keep your urine clear or pale yellow.  Eat a healthy diet that is low in salt (sodium). Do not add salt to your food. Check food labels to see how much sodium a food or beverage contains. Lifestyle  Do not use any products that contain nicotine or tobacco, such as cigarettes and e-cigarettes. If you need help quitting, ask your health care provider.  Do not use alcohol.  Avoid caffeine.  Avoid stress as much as possible. Rest and get plenty of sleep. General instructions  Take over-the-counter and prescription medicines only as told by your health care provider.  While lying down, lie on your left side. This keeps pressure off your baby.  While sitting or lying down, raise (elevate) your feet. Try putting some pillows under your lower legs.  Exercise regularly. Ask your health care provider what kinds of exercise are best for you.  Keep all prenatal and follow-up visits as told by your health care provider. This is important. Contact a health care provider if:  You have symptoms that your health care provider  told you may require more treatment or monitoring, such as:  Fever.  Vomiting.  Headache. Get help right away if:  You have severe abdominal pain or vomiting that does not get better with treatment.  You suddenly develop swelling in your hands, ankles, or face.  You gain 4 lbs (1.8 kg) or more in 1 week.  You develop vaginal bleeding, or you have blood in your urine.  You do not feel your baby moving as much as usual.  You have blurred or double vision.  You have muscle twitching or sudden tightening (spasms).  You have shortness of breath.  Your lips or fingernails turn blue. This information is not intended to replace advice given to you by your health care provider. Make sure you discuss any questions you have with your health care provider. Document Released: 02/20/2011 Document Revised: 12/23/2015 Document Reviewed: 11/18/2015 Elsevier Interactive Patient Education  2017 [employer].

## 2016-05-13 NOTE — MAU Note (Signed)
Urine sent to the lab

## 2016-05-13 NOTE — MAU Note (Signed)
Pt reports she has had some elevated B/P's and had a headache this am and her b/p was up. Took tylenol and slept awhile but the headache came back and her b/p was 120/98. Denies other symptoms.

## 2016-05-14 ENCOUNTER — Inpatient Hospital Stay (HOSPITAL_COMMUNITY): Payer: 59 | Admitting: Anesthesiology

## 2016-05-14 ENCOUNTER — Inpatient Hospital Stay (HOSPITAL_COMMUNITY)
Admission: RE | Admit: 2016-05-14 | Discharge: 2016-05-15 | DRG: 775 | Disposition: A | Discharge status: Home / Self Care | Payer: 59 | Source: Ambulatory Visit | Attending: Obstetrics and Gynecology | Admitting: Obstetrics and Gynecology

## 2016-05-14 ENCOUNTER — Encounter (HOSPITAL_COMMUNITY): Payer: Self-pay

## 2016-05-14 DIAGNOSIS — Z3A39 39 weeks gestation of pregnancy: Secondary | ICD-10-CM

## 2016-05-14 DIAGNOSIS — Z833 Family history of diabetes mellitus: Secondary | ICD-10-CM | POA: Diagnosis not present

## 2016-05-14 DIAGNOSIS — Z3A00 Weeks of gestation of pregnancy not specified: Secondary | ICD-10-CM | POA: Diagnosis not present

## 2016-05-14 DIAGNOSIS — Z3493 Encounter for supervision of normal pregnancy, unspecified, third trimester: Secondary | ICD-10-CM | POA: Diagnosis present

## 2016-05-14 DIAGNOSIS — O139 Gestational [pregnancy-induced] hypertension without significant proteinuria, unspecified trimester: Secondary | ICD-10-CM | POA: Diagnosis present

## 2016-05-14 DIAGNOSIS — O99824 Streptococcus B carrier state complicating childbirth: Secondary | ICD-10-CM | POA: Diagnosis present

## 2016-05-14 DIAGNOSIS — O134 Gestational [pregnancy-induced] hypertension without significant proteinuria, complicating childbirth: Secondary | ICD-10-CM | POA: Diagnosis not present

## 2016-05-14 LAB — COMPREHENSIVE METABOLIC PANEL
ALBUMIN: 3 g/dL — AB (ref 3.5–5.0)
ALT: 9 U/L — ABNORMAL LOW (ref 14–54)
ANION GAP: 9 (ref 5–15)
AST: 20 U/L (ref 15–41)
Alkaline Phosphatase: 75 U/L (ref 38–126)
BILIRUBIN TOTAL: 0.5 mg/dL (ref 0.3–1.2)
BUN: 5 mg/dL — ABNORMAL LOW (ref 6–20)
CO2: 20 mmol/L — AB (ref 22–32)
Calcium: 9.4 mg/dL (ref 8.9–10.3)
Chloride: 107 mmol/L (ref 101–111)
Creatinine, Ser: 0.48 mg/dL (ref 0.44–1.00)
GFR calc Af Amer: >60 mL/min (ref >60)
GFR calc non Af Amer: >60 mL/min (ref >60)
GLUCOSE: 86 mg/dL (ref 65–99)
Potassium: 4 mmol/L (ref 3.5–5.1)
SODIUM: 136 mmol/L (ref 135–145)
TOTAL PROTEIN: 5.9 g/dL — AB (ref 6.5–8.1)

## 2016-05-14 LAB — CBC
HEMATOCRIT: 34.8 % — AB (ref 36.0–46.0)
HEMOGLOBIN: 11.9 g/dL — AB (ref 12.0–15.0)
MCH: 29.9 pg (ref 26.0–34.0)
MCHC: 34.2 g/dL (ref 30.0–36.0)
MCV: 87.4 fL (ref 78.0–100.0)
Platelets: 237 10*3/uL (ref 150–400)
RBC: 3.98 MIL/uL (ref 3.87–5.11)
RDW: 15.3 % (ref 11.5–15.5)
WBC: 6.1 10*3/uL (ref 4.0–10.5)

## 2016-05-14 LAB — LACTATE DEHYDROGENASE: LDH: 123 U/L (ref 98–192)

## 2016-05-14 LAB — TYPE AND SCREEN
ABO/RH(D): A POS
ANTIBODY SCREEN: NEGATIVE

## 2016-05-14 LAB — URIC ACID: Uric Acid, Serum: 4.6 mg/dL (ref 2.3–6.6)

## 2016-05-14 LAB — ABO/RH: ABO/RH(D): A POS

## 2016-05-14 LAB — RPR: RPR: NONREACTIVE

## 2016-05-14 MED ORDER — EPHEDRINE 5 MG/ML INJ
10.0000 mg | INTRAVENOUS | Status: DC | PRN
  Filled 2016-05-14: qty 4

## 2016-05-14 MED ORDER — DIPHENHYDRAMINE HCL 25 MG PO CAPS: 25.0000 mg | ORAL_CAPSULE | Freq: Four times a day (QID) | ORAL | Status: DC | PRN

## 2016-05-14 MED ORDER — SIMETHICONE 80 MG PO CHEW: 80.0000 mg | CHEWABLE_TABLET | ORAL | Status: DC | PRN

## 2016-05-14 MED ORDER — PRENATAL MULTIVITAMIN CH
1.0000 | ORAL_TABLET | Freq: Every day | ORAL | Status: DC
  Administered 2016-05-15: 1 via ORAL
  Filled 2016-05-14: qty 1

## 2016-05-14 MED ORDER — PENICILLIN G POT IN DEXTROSE 60000 UNIT/ML IV SOLN
3.0000 10*6.[IU] | INTRAVENOUS | Status: DC
  Administered 2016-05-14: 3 10*6.[IU] via INTRAVENOUS
  Filled 2016-05-14 (×4): qty 50

## 2016-05-14 MED ORDER — LACTATED RINGERS IV SOLN
INTRAVENOUS | Status: DC
  Administered 2016-05-14: 08:00:00 via INTRAVENOUS

## 2016-05-14 MED ORDER — ONDANSETRON HCL 4 MG/2ML IJ SOLN: 4.0000 mg | INTRAMUSCULAR | Status: DC | PRN

## 2016-05-14 MED ORDER — OXYTOCIN 40 UNITS IN LACTATED RINGERS INFUSION - SIMPLE MED
2.5000 [IU]/h | INTRAVENOUS | Status: DC
Start: 2016-05-14 — End: 2016-05-14

## 2016-05-14 MED ORDER — WITCH HAZEL-GLYCERIN EX PADS: 1.0000 "application " | MEDICATED_PAD | CUTANEOUS | Status: DC | PRN

## 2016-05-14 MED ORDER — OXYCODONE-ACETAMINOPHEN 5-325 MG PO TABS: 1.0000 | ORAL_TABLET | ORAL | Status: DC | PRN

## 2016-05-14 MED ORDER — TERBUTALINE SULFATE 1 MG/ML IJ SOLN
0.2500 mg | Freq: Once | INTRAMUSCULAR | Status: DC | PRN
  Filled 2016-05-14: qty 1

## 2016-05-14 MED ORDER — LIDOCAINE HCL (PF) 1 % IJ SOLN
INTRAMUSCULAR | Status: DC | PRN
  Administered 2016-05-14: 6 mL via EPIDURAL
  Administered 2016-05-14: 4 mL

## 2016-05-14 MED ORDER — LIDOCAINE HCL (PF) 1 % IJ SOLN
30.0000 mL | INTRAMUSCULAR | Status: DC | PRN
  Filled 2016-05-14: qty 30

## 2016-05-14 MED ORDER — LACTATED RINGERS IV SOLN: 500.0000 mL | Freq: Once | INTRAVENOUS | Status: DC

## 2016-05-14 MED ORDER — MEDROXYPROGESTERONE ACETATE 150 MG/ML IM SUSP: 150.0000 mg | INTRAMUSCULAR | Status: DC | PRN

## 2016-05-14 MED ORDER — DIBUCAINE 1 % RE OINT: 1.0000 "application " | TOPICAL_OINTMENT | RECTAL | Status: DC | PRN

## 2016-05-14 MED ORDER — PENICILLIN G POTASSIUM 5000000 UNITS IJ SOLR
5.0000 10*6.[IU] | Freq: Once | INTRAVENOUS | Status: AC
  Administered 2016-05-14: 5 10*6.[IU] via INTRAVENOUS
  Filled 2016-05-14: qty 5

## 2016-05-14 MED ORDER — LACTATED RINGERS IV SOLN: 500.0000 mL | INTRAVENOUS | Status: DC | PRN

## 2016-05-14 MED ORDER — ACETAMINOPHEN 325 MG PO TABS: 650.0000 mg | ORAL_TABLET | ORAL | Status: DC | PRN

## 2016-05-14 MED ORDER — MEASLES, MUMPS & RUBELLA VAC ~~LOC~~ INJ
0.5000 mL | INJECTION | Freq: Once | SUBCUTANEOUS | Status: DC
  Filled 2016-05-14: qty 0.5

## 2016-05-14 MED ORDER — SENNOSIDES-DOCUSATE SODIUM 8.6-50 MG PO TABS
2.0000 | ORAL_TABLET | ORAL | Status: DC
  Administered 2016-05-14: 2 via ORAL
  Filled 2016-05-14: qty 2

## 2016-05-14 MED ORDER — OXYTOCIN 40 UNITS IN LACTATED RINGERS INFUSION - SIMPLE MED
1.0000 m[IU]/min | INTRAVENOUS | Status: DC
  Administered 2016-05-14: 2 m[IU]/min via INTRAVENOUS
  Filled 2016-05-14: qty 1000

## 2016-05-14 MED ORDER — ONDANSETRON HCL 4 MG/2ML IJ SOLN: 4.0000 mg | Freq: Four times a day (QID) | INTRAMUSCULAR | Status: DC | PRN

## 2016-05-14 MED ORDER — ONDANSETRON HCL 4 MG PO TABS: 4.0000 mg | ORAL_TABLET | ORAL | Status: DC | PRN

## 2016-05-14 MED ORDER — OXYTOCIN BOLUS FROM INFUSION: 500.0000 mL | Freq: Once | INTRAVENOUS | Status: DC

## 2016-05-14 MED ORDER — PHENYLEPHRINE 40 MCG/ML (10ML) SYRINGE FOR IV PUSH (FOR BLOOD PRESSURE SUPPORT)
80.0000 ug | PREFILLED_SYRINGE | INTRAVENOUS | Status: DC | PRN
  Filled 2016-05-14: qty 5
  Filled 2016-05-14: qty 10

## 2016-05-14 MED ORDER — BENZOCAINE-MENTHOL 20-0.5 % EX AERO
1.0000 "application " | INHALATION_SPRAY | CUTANEOUS | Status: DC | PRN
  Administered 2016-05-14: 1 via TOPICAL
  Filled 2016-05-14: qty 56

## 2016-05-14 MED ORDER — FLEET ENEMA 7-19 GM/118ML RE ENEM: 1.0000 | ENEMA | RECTAL | Status: DC | PRN

## 2016-05-14 MED ORDER — OXYCODONE-ACETAMINOPHEN 5-325 MG PO TABS: 2.0000 | ORAL_TABLET | ORAL | Status: DC | PRN

## 2016-05-14 MED ORDER — IBUPROFEN 600 MG PO TABS
600.0000 mg | ORAL_TABLET | Freq: Four times a day (QID) | ORAL | Status: DC
  Administered 2016-05-14 – 2016-05-15 (×4): 600 mg via ORAL
  Filled 2016-05-14 (×4): qty 1

## 2016-05-14 MED ORDER — FENTANYL 2.5 MCG/ML BUPIVACAINE 1/10 % EPIDURAL INFUSION (WH - ANES)
14.0000 mL/h | INTRAMUSCULAR | Status: DC | PRN
  Administered 2016-05-14 (×2): 14 mL/h via EPIDURAL
  Filled 2016-05-14: qty 100

## 2016-05-14 MED ORDER — TETANUS-DIPHTH-ACELL PERTUSSIS 5-2.5-18.5 LF-MCG/0.5 IM SUSP: 0.5000 mL | Freq: Once | INTRAMUSCULAR | Status: DC

## 2016-05-14 MED ORDER — SOD CITRATE-CITRIC ACID 500-334 MG/5ML PO SOLN: 30.0000 mL | ORAL | Status: DC | PRN

## 2016-05-14 MED ORDER — BUTORPHANOL TARTRATE 1 MG/ML IJ SOLN: 1.0000 mg | INTRAMUSCULAR | Status: DC | PRN

## 2016-05-14 MED ORDER — COCONUT OIL OIL: 1.0000 "application " | TOPICAL_OIL | Status: DC | PRN

## 2016-05-14 MED ORDER — DIPHENHYDRAMINE HCL 50 MG/ML IJ SOLN: 12.5000 mg | INTRAMUSCULAR | Status: DC | PRN

## 2016-05-14 MED ORDER — PHENYLEPHRINE 40 MCG/ML (10ML) SYRINGE FOR IV PUSH (FOR BLOOD PRESSURE SUPPORT)
80.0000 ug | PREFILLED_SYRINGE | INTRAVENOUS | Status: DC | PRN
  Filled 2016-05-14: qty 10
  Filled 2016-05-14: qty 5

## 2016-05-14 NOTE — Lactation Note (Signed)
This note was copied from a baby's chart. Lactation Consultation Note  Patient Name: Alicia Blair [id number] Date: 05/14/2016 Reason for consult: Initial assessment Baby at 3 hr of life. Mom reports baby latched well after birth. The 2nd bf she needed positional help from RN. She bf her 34 yr son for 60mand did not have a good experience. She stated she was locked in the bedroom all time bf the baby and never had a chance to recover or enjoy being a mom. Her plan is to ebf for about 2-3 wk then pump to feed. If she is unable to pump enough she plans to use formula. 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 Given PIS MBJ's paperwork completed and let of SMcGover[employer]esk in the office. Mom is asking to be fitted with a NS to take home. She stated that she would have quit bf if she had not had one with her son because he made her nipples so sore. She had grandparents coming to see the baby for the 1st time so she did not want to be fitted at this visit. She had a Lap Band done 8 yr ago. She has not had the band filled since being pregnant. She has been taking her PN vitamins as directed. Reviewed labs and did not see B 1, B 12, D, Folate will find out if mom needs to have these labs drawn. Discussed baby behavior, feeding frequency, baby belly size, voids, wt loss, breast changes, and nipple care. Mom stated that she can manually express and has spoon in the room. Given lactation handouts. Aware of OP services and support group.     Maternal Data Has patient been taught Hand Expression?: Yes Does the patient have breastfeeding experience prior to this delivery?: Yes  Feeding Feeding Type: Breast Fed Length of feed: 15 min  LATCH Score/Interventions Latch: Repeated attempts needed to sustain latch, nipple held in mouth throughout feeding, stimulation needed to elicit sucking reflex. Intervention(s): Adjust position;Assist with latch;Breast compression  Audible Swallowing: A few with  stimulation Intervention(s): Skin to skin  Type of Nipple: Everted at rest and after stimulation  Comfort (Breast/Nipple): Soft / non-tender     Hold (Positioning): Assistance needed to correctly position infant at breast and maintain latch.  LATCH Score: 7  Lactation Tools Discussed/Used WIC Program: No   Consult Status Consult Status: Follow-up Date: 05/15/16 Follow-up type: In-patient    EDenzil Hughes11/27/2017, 4:45 PM

## 2016-05-14 NOTE — Anesthesia Pain Management Evaluation Note (Addendum)
  CRNA Pain Management Visit Note  Patient: Alicia Blair, 40 y.o., female  "Hello I am a member of the anesthesia team at [facility]. We have an anesthesia team available at all times to provide care throughout the hospital, including epidural management and anesthesia for C-section. I don't know your plan for the delivery whether it a natural birth, water birth, IV sedation, nitrous supplementation, doula or epidural, but we want to meet your pain goals."   1.Was your pain managed to your expectations on prior hospitalizations?   yes  2.What is your expectation for pain management during this hospitalization?     Epidural  3.How can we help you reach that goal? Epidural when ready  Record the patient's initial score and the patient's pain goal.   Pain: 0  Pain Goal: 4 [facility] wants you to be able to say your pain was always managed very well.  Everette Rank 05/14/2016

## 2016-05-14 NOTE — H&P (Addendum)
Alicia Blair is a 40 y.o. female presenting for IOL.  Pregnancy uncomplicated OB History    Gravida Para Term Preterm AB Living   2 1 1     1    SAB TAB Ectopic Multiple Live Births                 Past Medical History:  Diagnosis Date  . Asthma    as a child  . Chicken pox   . Complication of anesthesia   . PONV (postoperative nausea and vomiting)    Past Surgical History:  Procedure Laterality Date  . breast augmentation  2003  . LAPAROSCOPIC GASTRIC BANDING  2008   Family History: family history includes Cancer (age of onset: 53) in her brother; Cancer (age of onset: 61) in her mother; Diabetes in her brother, maternal aunt, maternal grandmother, and mother. Social History:  reports that she has never smoked. She has never used smokeless tobacco. She reports that she does not drink alcohol or use drugs.     Maternal Diabetes: No Genetic Screening: Normal Maternal Ultrasounds/Referrals: Normal Fetal Ultrasounds or other Referrals:  None Maternal Substance Abuse:  No Significant Maternal Medications:  None Significant Maternal Lab Results:  None Other Comments:  None  ROS History   Height 5' 7"  (1.702 m), weight 217 lb (98.4 kg), last menstrual period 08/14/2015. Exam Physical Exam  Gen - NAD Abd - gravid, NT  EFW 9# Ext - NT Cvx 3.5cm, vtx AROM - clear Prenatal labs: ABO, Rh: A/Positive/-- (04/25 0000) Antibody: Negative (04/25 0000) Rubella: Immune (04/25 0000) RPR: Nonreactive (04/25 0000)  HBsAg: Negative (04/25 0000)  HIV: Non-reactive (04/25 0000)  GBS: Positive (04/25 0000)   Assessment/Plan: Admit PCN Pitocin IOL Mays Paino 05/14/2016, 7:33 AM

## 2016-05-14 NOTE — Anesthesia Preprocedure Evaluation (Signed)
Anesthesia Evaluation  Patient identified by MRN, date of birth, ID band Patient awake    Reviewed: Allergy & Precautions, H&P , Patient's Chart, lab work & pertinent test results  Airway Mallampati: II  TM Distance: >3 FB Neck ROM: full    Dental  (+) Teeth Intact   Pulmonary asthma ,    breath sounds clear to auscultation       Cardiovascular  Rhythm:regular Rate:Normal     Neuro/Psych    GI/Hepatic   Endo/Other    Renal/GU      Musculoskeletal   Abdominal   Peds  Hematology   Anesthesia Other Findings       Reproductive/Obstetrics (+) Pregnancy                             Anesthesia Physical Anesthesia Plan  ASA: II  Anesthesia Plan: Epidural   Post-op Pain Management:    Induction:   Airway Management Planned:   Additional Equipment:   Intra-op Plan:   Post-operative Plan:   Informed Consent: I have reviewed the patients History and Physical, chart, labs and discussed the procedure including the risks, benefits and alternatives for the proposed anesthesia with the patient or authorized representative who has indicated his/her understanding and acceptance.   Dental Advisory Given  Plan Discussed with:   Anesthesia Plan Comments: (Labs checked- platelets confirmed with RN in room. Fetal heart tracing, per RN, reported to be stable enough for sitting procedure. Discussed epidural, and patient consents to the procedure:  included risk of possible headache,backache, failed block, allergic reaction, and nerve injury. This patient was asked if she had any questions or concerns before the procedure started.)        Anesthesia Quick Evaluation

## 2016-05-14 NOTE — Anesthesia Procedure Notes (Signed)
Epidural Patient location during procedure: OB  Preanesthetic Checklist Completed: patient identified, site marked, surgical consent, pre-op evaluation, timeout performed, IV checked, risks and benefits discussed and monitors and equipment checked  Epidural Patient position: sitting Prep: site prepped and draped and DuraPrep Patient monitoring: continuous pulse ox and blood pressure Approach: midline Injection technique: LOR air  Needle:  Needle type: Tuohy  Needle gauge: 17 G Needle length: 9 cm and 9 Needle insertion depth: 6 cm Catheter type: closed end flexible Catheter size: 19 Gauge Catheter at skin depth: 12 cm Test dose: negative  Assessment Events: blood not aspirated, injection not painful, no injection resistance, negative IV test and no paresthesia  Additional Notes Dosing of Epidural:  1st dose, through catheter ............................................Marland Kitchen  Xylocaine 40 mg  2nd dose, through catheter, after waiting 3 minutes........Marland KitchenXylocaine 60 mg    As each dose occurred, patient was free of IV sx; and patient exhibited no evidence of SA injection.  Patient is more comfortable after epidural dosed. Please see RN's note for documentation of vital signs,and FHR which are stable.  Patient reminded not to try to ambulate with numb legs, and that an RN must be present when she attempts to get up.

## 2016-05-14 NOTE — Anesthesia Postprocedure Evaluation (Signed)
Anesthesia Post Note  Patient: Alicia Blair  Procedure(s) Performed: * No procedures listed *  Patient location during evaluation: Mother Baby Anesthesia Type: Epidural Level of consciousness: awake Pain management: pain level controlled Vital Signs Assessment: post-procedure vital signs reviewed and stable Respiratory status: spontaneous breathing Cardiovascular status: stable Postop Assessment: no headache, no backache, epidural receding, patient able to bend at knees, no signs of nausea or vomiting and adequate PO intake Anesthetic complications: no     Last Vitals:  Vitals:   05/14/16 1351 05/14/16 1415  BP: 132/68 127/67  Pulse: 61 69  Resp: 18 20  Temp:  36.6 C    Last Pain:  Vitals:   05/14/16 1415  TempSrc: Oral  PainSc:    Pain Goal:                 [name]

## 2016-05-14 NOTE — Progress Notes (Signed)
SVD of vigerous female infant w/ apgars of 8,9.  Placenta delivered spontaneous w/ 3VC.   1st degree lac repaired w/ 3-0 vicryl rapide.  Fundus firm.  EBL 100cc .

## 2016-05-15 LAB — CBC
HCT: 30.2 % — ABNORMAL LOW (ref 36.0–46.0)
Hemoglobin: 10.4 g/dL — ABNORMAL LOW (ref 12.0–15.0)
MCH: 30 pg (ref 26.0–34.0)
MCHC: 34.4 g/dL (ref 30.0–36.0)
MCV: 87 fL (ref 78.0–100.0)
PLATELETS: 193 10*3/uL (ref 150–400)
RBC: 3.47 MIL/uL — ABNORMAL LOW (ref 3.87–5.11)
RDW: 15.3 % (ref 11.5–15.5)
WBC: 8.2 10*3/uL (ref 4.0–10.5)

## 2016-05-15 MED ORDER — IBUPROFEN 600 MG PO TABS: 600.0000 mg | ORAL_TABLET | Freq: Four times a day (QID) | ORAL | 0 refills | Status: DC | PRN

## 2016-05-15 NOTE — Discharge Instructions (Signed)
No vaginal entry

## 2016-05-15 NOTE — Progress Notes (Signed)
Post Partum Day 1 Subjective: no complaints, up ad lib, voiding, tolerating PO and + flatus  Objective: Blood pressure 129/83, pulse 75, temperature 97.3 F (36.3 C), resp. rate 18, height 5' 7"  (1.702 m), weight 217 lb (98.4 kg), last menstrual period 08/14/2015, unknown if currently breastfeeding.  Physical Exam:  General: alert, cooperative and no distress Lochia: appropriate Uterine Fundus: firm Incision: healing well DVT Evaluation: No evidence of DVT seen on physical exam.   Recent Labs  05/14/16 0745 05/15/16 0608  HGB 11.9* 10.4*  HCT 34.8* 30.2*    Assessment/Plan: Plan for discharge tomorrow   LOS: 1 day   Faithlyn Recktenwald II,Chayil Gantt E 05/15/2016, 7:52 AM

## 2016-05-15 NOTE — Discharge Summary (Signed)
Obstetric Discharge Summary Reason for Admission: induction of labor Prenatal Procedures: none Intrapartum Procedures: spontaneous vaginal delivery Postpartum Procedures: none Complications-Operative and Postpartum: none Hemoglobin  Date Value Ref Range Status  05/15/2016 10.4 (L) 12.0 - 15.0 g/dL Final   HGB  Date Value Ref Range Status  02/05/2014 12.9 12.0 - 16.0 g/dL Final   HCT  Date Value Ref Range Status  05/15/2016 30.2 (L) 36.0 - 46.0 % Final  02/05/2014 39.7 35.0 - 47.0 % Final    Physical Exam:  General: alert, cooperative and no distress Lochia: appropriate Uterine Fundus: firm Incision: healing well DVT Evaluation: No evidence of DVT seen on physical exam.  Discharge Diagnoses: Term Pregnancy-delivered  Discharge Information: Date: 05/15/2016 Activity: pelvic rest Diet: routine Medications: PNV and Ibuprofen Condition: stable Instructions: refer to practice specific booklet Discharge to: home   Newborn Data: Live born female  Birth Weight: 8 lb 7.5 oz (3840 g) APGAR: 8, 9  Home with mother.  Karee Forge II,Lorenza Winkleman E 05/15/2016, 2:19 PM

## 2016-05-15 NOTE — Progress Notes (Signed)
CSW acknowledged consult and met with MOB to complete an assessment for hx of mild PPD. MOB gave CSW permission to meet with MOB while FOB/Husband (Adam Zeis) was present.  MOB was polite and receptive to meeting with CSW.  CSW inquired about MOB's PPD hx and MOB denied a hx. MOB expressed that MOB 's signs and symptoms were situational and related to MOB's relationship with MOB's ex-husband.  CSW educated MOB and FOB about PPD. CSW informed the parents of possible supports and interventions to decrease PPD.  CSW also encouraged MOB to seek medical attention if needed for increased signs and symptoms for PPD.   MOB denied any psychosocial stressors.  Author Hatlestad Boyd-Gilyard, MSW, LCSW Clinical Social Work [phone]   

## 2016-05-17 ENCOUNTER — Telehealth (HOSPITAL_COMMUNITY): Payer: Self-pay | Admitting: Lactation Services

## 2016-05-17 NOTE — Telephone Encounter (Signed)
Mom called,left message and I called her back. Reports breasts are very full and lumpy, When pumping nothing is coming out. Baby is latching but not softening breast. Baby has had 5 stools and 3 voids so far today. Has breast implants. Reviewed engorgement treatment. Has just nursed baby and she is content. Encouraged good massage,warm compresses and to pump 15-30 min. Encouraged to cover herself so she does not watch pump and to relax as much as possible.Encouraged breast compression while pumping to soften breast. Encouraged ice packs between feedings. Right nipple very sore, Bought her own NS at [employer] today. Has used NS with previous baby. Encouraged to try these suggestions and call back if no better. No further questions at present.

## 2016-05-24 ENCOUNTER — Encounter (HOSPITAL_COMMUNITY): Payer: Self-pay

## 2016-05-24 MED ORDER — DEXTROSE 5 % IV SOLN
2.0000 g | INTRAVENOUS | Status: AC
  Filled 2016-05-24: qty 2

## 2016-05-25 ENCOUNTER — Ambulatory Visit (HOSPITAL_COMMUNITY): Admission: RE | Admit: 2016-05-25 | Payer: 59 | Source: Ambulatory Visit | Admitting: Obstetrics and Gynecology

## 2016-05-25 SURGERY — DILATION AND EVACUATION, UTERUS
Anesthesia: Choice

## 2016-06-05 ENCOUNTER — Telehealth (HOSPITAL_COMMUNITY): Payer: Self-pay | Admitting: Lactation Services

## 2016-06-05 NOTE — Telephone Encounter (Signed)
Mother constipated and was requesting information regarding various laxatives.  Discussed medication information from [place] and suggest she contact her OB/GYN.  Increase fiber and hydrate also.  Mother has implants and is concerned about her milk supply.  She is considering stopping breastfeeding.  Suggest she review website [email or web address].  Also discussed applying cabbage leaves.

## 2016-06-20 DIAGNOSIS — H5231 Anisometropia: Secondary | ICD-10-CM | POA: Diagnosis not present

## 2016-06-26 DIAGNOSIS — Z1389 Encounter for screening for other disorder: Secondary | ICD-10-CM | POA: Diagnosis not present

## 2016-07-19 ENCOUNTER — Encounter: Payer: Self-pay | Admitting: Physician Assistant

## 2016-07-19 ENCOUNTER — Ambulatory Visit: Payer: Self-pay | Admitting: Physician Assistant

## 2016-07-19 VITALS — BP 120/80 | HR 60 | Temp 98.3°F

## 2016-07-19 DIAGNOSIS — J029 Acute pharyngitis, unspecified: Secondary | ICD-10-CM

## 2016-07-19 LAB — POCT RAPID STREP A (OFFICE): RAPID STREP A SCREEN: NEGATIVE

## 2016-07-19 NOTE — Progress Notes (Signed)
S: c/o sore throat for 5 days, no fever/chills, no cough or congestion, worried about strep, states she has an 45 week old infant, + breast feeding, no known exposure to strep  O: vitals wnl, nad, tms clear, throat wnl, neck supple + swollen submandibular nodes b/l, lungs c t a, cv rrr, q strep neg  A: sore throat  P: reassurance, gargle with warm salt water

## 2016-11-27 ENCOUNTER — Telehealth: Payer: Self-pay | Admitting: Family

## 2016-11-27 ENCOUNTER — Other Ambulatory Visit: Payer: Self-pay | Admitting: Family

## 2016-11-27 DIAGNOSIS — R112 Nausea with vomiting, unspecified: Secondary | ICD-10-CM

## 2016-11-27 MED ORDER — ONDANSETRON 4 MG PO TBDP: 4.0000 mg | ORAL_TABLET | Freq: Three times a day (TID) | ORAL | 0 refills | Status: DC | PRN

## 2016-11-27 NOTE — Progress Notes (Unsigned)
E-prescribing zofran from e-visit

## 2016-11-27 NOTE — Progress Notes (Signed)
We are sorry that you are not feeling well. Here is how we plan to help!  Based on what you have shared with me it looks like you have a Virus that is irritating your GI tract.  Vomiting is the forceful emptying of a portion of the stomach's content through the mouth.  Although nausea and vomiting can make you feel miserable, it's important to remember that these are not diseases, but rather symptoms of an underlying illness.  When we treat short term symptoms, we always caution that any symptoms that persist should be fully evaluated in a medical office.  I have prescribed a medication that will help alleviate your symptoms and allow you to stay hydrated:  Zofran 4 mg 1 tablet every 8 hours as needed for nausea and vomiting  HOME CARE:  Drink clear liquids.  This is very important! Dehydration (the lack of fluid) can lead to a serious complication.  Start off with 1 tablespoon every 5 minutes for 8 hours.  You may begin eating bland foods after 8 hours without vomiting.  Start with saltine crackers, white bread, rice, mashed potatoes, applesauce.  After 48 hours on a bland diet, you may resume a normal diet.  Try to go to sleep.  Sleep often empties the stomach and relieves the need to vomit.  GET HELP RIGHT AWAY IF:   Your symptoms do not improve or worsen within 2 days after treatment.  You have a fever for over 3 days.  You cannot keep down fluids after trying the medication.  MAKE SURE YOU:   Understand these instructions.  Will watch your condition.  Will get help right away if you are not doing well or get worse.   Thank you for choosing an e-visit. Your e-visit answers were reviewed by a board certified advanced clinical practitioner to complete your personal care plan. Depending upon the condition, your plan could have included both over the counter or prescription medications. Please review your pharmacy choice. Be sure that the pharmacy you have chosen is open so  that you can pick up your prescription now.  If there is a problem you may message your provider in [place] to have the prescription routed to another pharmacy. Your safety is important to [place]. If you have drug allergies check your prescription carefully.  For the next 24 hours, you can use MyChart to ask questions about today's visit, request a non-urgent call back, or ask for a work or school excuse from your e-visit provider. You will get an e-mail in the next two days asking about your experience. I hope that your e-visit has been valuable and will speed your recovery.

## 2016-12-20 DIAGNOSIS — N939 Abnormal uterine and vaginal bleeding, unspecified: Secondary | ICD-10-CM | POA: Diagnosis not present

## 2016-12-24 DIAGNOSIS — N762 Acute vulvitis: Secondary | ICD-10-CM | POA: Diagnosis not present

## 2017-01-09 DIAGNOSIS — R87611 Atypical squamous cells cannot exclude high grade squamous intraepithelial lesion on cytologic smear of cervix (ASC-H): Secondary | ICD-10-CM | POA: Diagnosis not present

## 2017-01-09 DIAGNOSIS — R87810 Cervical high risk human papillomavirus (HPV) DNA test positive: Secondary | ICD-10-CM | POA: Diagnosis not present

## 2017-01-09 DIAGNOSIS — N870 Mild cervical dysplasia: Secondary | ICD-10-CM | POA: Diagnosis not present

## 2017-01-10 LAB — HM PAP SMEAR

## 2017-02-19 DIAGNOSIS — Z1231 Encounter for screening mammogram for malignant neoplasm of breast: Secondary | ICD-10-CM | POA: Diagnosis not present

## 2017-02-19 LAB — HM MAMMOGRAPHY

## 2017-02-20 DIAGNOSIS — D2361 Other benign neoplasm of skin of right upper limb, including shoulder: Secondary | ICD-10-CM | POA: Diagnosis not present

## 2017-02-20 DIAGNOSIS — L400 Psoriasis vulgaris: Secondary | ICD-10-CM | POA: Diagnosis not present

## 2017-02-20 DIAGNOSIS — L821 Other seborrheic keratosis: Secondary | ICD-10-CM | POA: Diagnosis not present

## 2017-02-20 DIAGNOSIS — D2339 Other benign neoplasm of skin of other parts of face: Secondary | ICD-10-CM | POA: Diagnosis not present

## 2017-04-05 ENCOUNTER — Ambulatory Visit (INDEPENDENT_AMBULATORY_CARE_PROVIDER_SITE_OTHER): Payer: 59 | Admitting: Internal Medicine

## 2017-04-05 ENCOUNTER — Encounter: Payer: Self-pay | Admitting: Internal Medicine

## 2017-04-05 VITALS — BP 110/66 | HR 72 | Temp 98.4°F | Resp 14 | Ht 67.0 in | Wt 195.8 lb

## 2017-04-05 DIAGNOSIS — E6609 Other obesity due to excess calories: Secondary | ICD-10-CM | POA: Diagnosis not present

## 2017-04-05 DIAGNOSIS — E559 Vitamin D deficiency, unspecified: Secondary | ICD-10-CM | POA: Diagnosis not present

## 2017-04-05 DIAGNOSIS — Z0001 Encounter for general adult medical examination with abnormal findings: Secondary | ICD-10-CM

## 2017-04-05 DIAGNOSIS — R635 Abnormal weight gain: Secondary | ICD-10-CM

## 2017-04-05 DIAGNOSIS — Z0000 Encounter for general adult medical examination without abnormal findings: Secondary | ICD-10-CM

## 2017-04-05 DIAGNOSIS — Z6830 Body mass index (BMI) 30.0-30.9, adult: Secondary | ICD-10-CM

## 2017-04-05 DIAGNOSIS — E538 Deficiency of other specified B group vitamins: Secondary | ICD-10-CM | POA: Diagnosis not present

## 2017-04-05 DIAGNOSIS — R5383 Other fatigue: Secondary | ICD-10-CM | POA: Diagnosis not present

## 2017-04-05 LAB — LIPID PANEL
CHOL/HDL RATIO: 3
Cholesterol: 194 mg/dL (ref 0–200)
HDL: 62.1 mg/dL (ref >39.00)
LDL CALC: 120 mg/dL — AB (ref 0–99)
NONHDL: 131.5
Triglycerides: 57 mg/dL (ref 0.0–149.0)
VLDL: 11.4 mg/dL (ref 0.0–40.0)

## 2017-04-05 LAB — CBC WITH DIFFERENTIAL/PLATELET
Basophils Absolute: 0.1 10*3/uL (ref 0.0–0.1)
Basophils Relative: 1.3 % (ref 0.0–3.0)
EOS PCT: 4 % (ref 0.0–5.0)
Eosinophils Absolute: 0.2 10*3/uL (ref 0.0–0.7)
HEMATOCRIT: 41.3 % (ref 36.0–46.0)
HEMOGLOBIN: 13.8 g/dL (ref 12.0–15.0)
Lymphocytes Relative: 39.3 % (ref 12.0–46.0)
Lymphs Abs: 2 10*3/uL (ref 0.7–4.0)
MCHC: 33.4 g/dL (ref 30.0–36.0)
MCV: 91 fl (ref 78.0–100.0)
MONOS PCT: 9.1 % (ref 3.0–12.0)
Monocytes Absolute: 0.5 10*3/uL (ref 0.1–1.0)
Neutro Abs: 2.4 10*3/uL (ref 1.4–7.7)
Neutrophils Relative %: 46.3 % (ref 43.0–77.0)
Platelets: 223 10*3/uL (ref 150.0–400.0)
RBC: 4.54 Mil/uL (ref 3.87–5.11)
RDW: 13.1 % (ref 11.5–15.5)
WBC: 5.1 10*3/uL (ref 4.0–10.5)

## 2017-04-05 LAB — COMPREHENSIVE METABOLIC PANEL
ALK PHOS: 28 U/L — AB (ref 39–117)
ALT: 9 U/L (ref 0–35)
AST: 14 U/L (ref 0–37)
Albumin: 4.2 g/dL (ref 3.5–5.2)
BILIRUBIN TOTAL: 0.4 mg/dL (ref 0.2–1.2)
BUN: 7 mg/dL (ref 6–23)
CO2: 28 meq/L (ref 19–32)
CREATININE: 0.65 mg/dL (ref 0.40–1.20)
Calcium: 9.2 mg/dL (ref 8.4–10.5)
Chloride: 105 mEq/L (ref 96–112)
GFR: 106.62 mL/min (ref >60.00)
GLUCOSE: 97 mg/dL (ref 70–99)
Potassium: 4.6 mEq/L (ref 3.5–5.1)
SODIUM: 140 meq/L (ref 135–145)
TOTAL PROTEIN: 6.8 g/dL (ref 6.0–8.3)

## 2017-04-05 LAB — VITAMIN D 25 HYDROXY (VIT D DEFICIENCY, FRACTURES): VITD: 26.54 ng/mL — ABNORMAL LOW (ref 30.00–100.00)

## 2017-04-05 LAB — HEMOGLOBIN A1C: Hgb A1c MFr Bld: 5.6 % (ref 4.6–6.5)

## 2017-04-05 LAB — VITAMIN B12: VITAMIN B 12: 202 pg/mL — AB (ref 211–911)

## 2017-04-05 LAB — TSH: TSH: 2.65 u[IU]/mL (ref 0.35–4.50)

## 2017-04-05 NOTE — Progress Notes (Signed)
Patient ID: Alicia Blair, female    DOB: January 07, 1976  Age: 41 y.o. MRN: [id number]  The patient is here for non gyn  preventive  wellness examination and management of other chronic and acute problems.    Last seen by me in 2016  Prior to pregnancy at advance maternal age (planned). Delivered healthy baby in  Nov 7124, no complications.  Healthy  48 month old  Healthy  Girl  Alicia Blair .   mammogram  Normal  persistently abnormal PAP   ,  Colposcopy unchanged ,  Repeat planned for 6 months and LEEP if still abnormal. 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  Duties all entirely administrative, with lots of negotiation with insurers. Climate is very political.      The risk factors are reflected in the social history.  The roster of all physicians providing medical care to patient - is listed in the Snapshot section of the chart.  Activities of daily living:  The patient is 100% independent in all ADLs: dressing, toileting, feeding as well as independent mobility  Home safety : The patient has smoke detectors in the home. They wear seatbelts.  There are no firearms at home. There is no violence in the home.   There is no risks for hepatitis, STDs or HIV. There is no   history of blood transfusion. They have no travel history to infectious disease endemic areas of the world.  The patient has seen their dentist in the last six month. They have seen their eye doctor in the last year.    They do not  have excessive sun exposure. Discussed the need for sun protection: hats, long sleeves and use of sunscreen if there is significant sun exposure.   Diet: the importance of a healthy diet is discussed. They do have a healthy diet.  The benefits of regular aerobic exercise were discussed. She walks 4 times per week ,  20 minutes.   Depression screen: there are no signs or vegative symptoms of depression- irritability, change in appetite, anhedonia,  sadness/tearfullness.  Cognitive assessment: the patient manages all their financial and personal affairs and is actively engaged. They could relate day,date,year and events; recalled 2/3 objects at 3 minutes; performed clock-face test normally.  The following portions of the patient's history were reviewed and updated as appropriate: allergies, current medications, past family history, past medical history,  past surgical history, past social history  and problem list.  Visual acuity was not assessed per patient preference since she has regular follow up with her ophthalmologist. Hearing and body mass index were assessed and reviewed.   During the course of the visit the patient was educated and counseled about appropriate screening and preventive services including : fall prevention , diabetes screening, nutrition counseling, colorectal cancer screening, and recommended immunizations.    CC: The primary encounter diagnosis was Weight gain. Diagnoses of Vitamin D deficiency, Fatigue, unspecified type, Class 1 obesity due to excess calories without serious comorbidity with body mass index (BMI) of 30.0 to 30.9 in adult, Visit for preventive health examination, and B12 deficiency were also pertinent to this visit.  1) weight gain; not exercising,  Full time job/career,  49 month old daughter.  History Alicia Blair has a past medical history of Anemia; Asthma; Chicken pox; Complication of anesthesia; and PONV (postoperative nausea and vomiting).   She has a past surgical history that includes Laparoscopic gastric banding (2008); breast augmentation (  2003); Hernia repair; and Wisdom tooth extraction.   Her family history includes Cancer (age of onset: 19) in her brother; Cancer (age of onset: 27) in her mother; Diabetes in her brother, maternal aunt, maternal grandmother, and mother.She reports that she has never smoked. She has never used smokeless tobacco. She reports that she drinks about 1.8 oz of  alcohol per week . She reports that she does not use drugs.  Outpatient Medications Prior to Visit  Medication Sig Dispense Refill  . Prenatal Vit-Fe Fumarate-FA (PRENATAL MULTIVITAMIN) TABS tablet Take 1 tablet by mouth daily.     Marland Kitchen acetaminophen (TYLENOL) 500 MG chewable tablet Chew 1,000 mg by mouth every 6 (six) hours as needed for pain.    Marland Kitchen ibuprofen (ADVIL,MOTRIN) 600 MG tablet Take 1 tablet (600 mg total) by mouth every 6 (six) hours as needed. (Patient not taking: Reported on 04/05/2017) 30 tablet 0  . LO LOESTRIN FE 1 MG-10 MCG / 10 MCG tablet   12  . ondansetron (ZOFRAN ODT) 4 MG disintegrating tablet Take 1 tablet (4 mg total) by mouth every 8 (eight) hours as needed for nausea or vomiting. (Patient not taking: Reported on 04/05/2017) 20 tablet 0  . ondansetron (ZOFRAN ODT) 4 MG disintegrating tablet Take 1 tablet (4 mg total) by mouth every 8 (eight) hours as needed for nausea or vomiting. (Patient not taking: Reported on 04/05/2017) 20 tablet 0   No facility-administered medications prior to visit.     Review of Systems  Patient denies headache, fevers, malaise, unintentional weight loss, skin rash, eye pain, sinus congestion and sinus pain, sore throat, dysphagia,  hemoptysis , cough, dyspnea, wheezing, chest pain, palpitations, orthopnea, edema, abdominal pain, nausea, melena, diarrhea, constipation, flank pain, dysuria, hematuria, urinary  Frequency, nocturia, numbness, tingling, seizures,  Focal weakness, Loss of consciousness,  Tremor, insomnia, depression, anxiety, and suicidal ideation.     Objective:  BP 110/66 (BP Location: Left Arm, Patient Position: Sitting, Cuff Size: Normal)   Pulse 72   Temp 98.4 F (36.9 C) (Oral)   Resp 14   Ht 5' 7"  (1.702 m)   Wt 195 lb 12.8 oz (88.8 kg)   SpO2 98%   BMI 30.67 kg/m   Physical Exam   General appearance: alert, cooperative and appears stated age Ears: normal TM's and external ear canals both ears Throat: lips, mucosa,  and tongue normal; teeth and gums normal Neck: no adenopathy, no carotid bruit, supple, symmetrical, trachea midline and thyroid not enlarged, symmetric, no tenderness/mass/nodules Back: symmetric, no curvature. ROM normal. No CVA tenderness. Lungs: clear to auscultation bilaterally Heart: regular rate and rhythm, S1, S2 normal, no murmur, click, rub or gallop Abdomen: soft, non-tender; bowel sounds normal; no masses,  no organomegaly Pulses: 2+ and symmetric Skin: Skin color, texture, turgor normal. No rashes or lesions Lymph nodes: Cervical, supraclavicular, and axillary nodes normal.    Assessment & Plan:   Problem List Items Addressed This Visit    B12 deficiency    Etiology may be iarogenic due to lap band surgical history .  Will replace with IM injections pending intrinsic factor ab status      Obesity due to excess calories without serious comorbidity    Weight gain aggravated by recent term pregnancy and lack of exercise due to full time job and birth of daughter 11 months ago.  I have addressed  BMI and recommended wt loss of 10% of body weight over the next 6 months using a low glycemic index diet  and regular exercise a minimum of 5 days per week.   Will start b12 injections to boost energy level as well.       Visit for preventive health examination    Annual comprehensive preventive exam was done as well as an evaluation and management of chronic conditions .  During the course of the visit the patient was educated and counseled about appropriate screening and preventive services including :  diabetes screening, lipid analysis with projected  10 year  risk for CAD , nutrition counseling, breast, cervical and colorectal cancer screening, and recommended immunizations.  Printed recommendations for health maintenance screenings was given  Lab Results  Component Value Date   CHOL 194 04/05/2017   HDL 62.10 04/05/2017   LDLCALC 120 (H) 04/05/2017   TRIG 57.0 04/05/2017    CHOLHDL 3 04/05/2017   Lab Results  Component Value Date   TSH 2.65 04/05/2017   Lab Results  Component Value Date   WBC 5.1 04/05/2017   HGB 13.8 04/05/2017   HCT 41.3 04/05/2017   MCV 91.0 04/05/2017   PLT 223.0 04/05/2017   Lab Results  Component Value Date   HGBA1C 5.6 04/05/2017          Other Visit Diagnoses    Weight gain    -  Primary   Relevant Orders   Comprehensive metabolic panel (Completed)   Hemoglobin A1c (Completed)   Lipid panel (Completed)   Vitamin D deficiency       Relevant Orders   VITAMIN D 25 Hydroxy (Vit-D Deficiency, Fractures) (Completed)   Fatigue, unspecified type       Relevant Orders   Vitamin B12 (Completed)   TSH (Completed)   CBC with Differential/Platelet (Completed)      I have discontinued Ms. Slay's acetaminophen, ibuprofen, LO LOESTRIN FE, ondansetron, and ondansetron. I am also having her start on cyanocobalamin and SYRINGE 3CC/25GX1". Additionally, I am having her maintain her prenatal multivitamin.  Meds ordered this encounter  Medications  . cyanocobalamin (,VITAMIN B-12,) 1000 MCG/ML injection    Sig: Inject 1 mL (1,000 mcg total) into the muscle 2 (two) times a week. For 4 doses,  Then monthly thereafter    Dispense:  10 mL    Refill:  6  . Syringe/Needle, Disp, (SYRINGE 3CC/25GX1") 25G X 1" 3 ML MISC    Sig: Use for b12 injections    Dispense:  50 each    Refill:  0    Medications Discontinued During This Encounter  Medication Reason  . acetaminophen (TYLENOL) 500 MG chewable tablet Patient has not taken in last 30 days  . ibuprofen (ADVIL,MOTRIN) 600 MG tablet Patient has not taken in last 30 days  . LO LOESTRIN FE 1 MG-10 MCG / 10 MCG tablet Patient has not taken in last 30 days  . ondansetron (ZOFRAN ODT) 4 MG disintegrating tablet Patient has not taken in last 30 days  . ondansetron (ZOFRAN ODT) 4 MG disintegrating tablet Patient has not taken in last 30 days    Follow-up: No Follow-up on  file.   Crecencio Mc, MD

## 2017-04-05 NOTE — Patient Instructions (Addendum)
Good to see you again!   You should try [employer]'s Sinus rinse ;  It is a stong sinus "flush" using water and medicated salts.  Do it over the sink because it can be a bit messy   I recommend starting an Exercise program  30 minutes  5 days per week   +

## 2017-04-07 DIAGNOSIS — E538 Deficiency of other specified B group vitamins: Secondary | ICD-10-CM | POA: Insufficient documentation

## 2017-04-07 MED ORDER — CYANOCOBALAMIN 1000 MCG/ML IJ SOLN: 1000.0000 ug | INTRAMUSCULAR | 6 refills | Status: DC

## 2017-04-07 MED ORDER — SYRINGE 25G X 1" 3 ML MISC
0 refills | Status: DC
Start: 2017-04-07 — End: 2018-04-23

## 2017-04-07 NOTE — Assessment & Plan Note (Signed)
Annual comprehensive preventive exam was done as well as an evaluation and management of chronic conditions .  During the course of the visit the patient was educated and counseled about appropriate screening and preventive services including :  diabetes screening, lipid analysis with projected  10 year  risk for CAD , nutrition counseling, breast, cervical and colorectal cancer screening, and recommended immunizations.  Printed recommendations for health maintenance screenings was given  Lab Results  Component Value Date   CHOL 194 04/05/2017   HDL 62.10 04/05/2017   LDLCALC 120 (H) 04/05/2017   TRIG 57.0 04/05/2017   CHOLHDL 3 04/05/2017   Lab Results  Component Value Date   TSH 2.65 04/05/2017   Lab Results  Component Value Date   WBC 5.1 04/05/2017   HGB 13.8 04/05/2017   HCT 41.3 04/05/2017   MCV 91.0 04/05/2017   PLT 223.0 04/05/2017   Lab Results  Component Value Date   HGBA1C 5.6 04/05/2017

## 2017-04-07 NOTE — Assessment & Plan Note (Signed)
Etiology may be iarogenic due to lap band surgical history .  Will replace with IM injections pending intrinsic factor ab status

## 2017-04-07 NOTE — Assessment & Plan Note (Signed)
Weight gain aggravated by recent term pregnancy and lack of exercise due to full time job and birth of daughter 11 months ago.  I have addressed  BMI and recommended wt loss of 10% of body weight over the next 6 months using a low glycemic index diet and regular exercise a minimum of 5 days per week.   Will start b12 injections to boost energy level as well.

## 2017-04-25 ENCOUNTER — Encounter: Payer: Self-pay | Admitting: Internal Medicine

## 2017-04-28 ENCOUNTER — Encounter: Payer: Self-pay | Admitting: Internal Medicine

## 2017-04-29 ENCOUNTER — Encounter: Payer: Self-pay | Admitting: Internal Medicine

## 2017-04-29 MED ORDER — PREDNISONE 10 MG PO TABS: ORAL_TABLET | ORAL | 0 refills | Status: DC

## 2017-05-08 ENCOUNTER — Encounter: Payer: Self-pay | Admitting: Physician Assistant

## 2017-05-08 ENCOUNTER — Ambulatory Visit: Payer: Self-pay | Admitting: Physician Assistant

## 2017-05-08 VITALS — BP 120/82 | HR 87 | Temp 98.2°F

## 2017-05-08 DIAGNOSIS — H6981 Other specified disorders of Eustachian tube, right ear: Secondary | ICD-10-CM

## 2017-05-08 MED ORDER — FLUTICASONE PROPIONATE 50 MCG/ACT NA SUSP: 2.0000 | Freq: Every day | NASAL | 6 refills | Status: DC

## 2017-05-08 NOTE — Progress Notes (Signed)
S: Patient complains of right ear pain that radiates down the side of her neck, is worse when she opens her jaw, denies sinus pain, states her throat is a little sore, no cough or congestion, no fever or chills  O: Vitals are normal, TMs are clear, throat shows some postnasal drip, neck is supple, no lymphadenopathy noted, lungs are clear to auscultation, heart sounds are normal  A: eustachean tube dysfunction  P: flonase, sudafed, return if worsening

## 2017-07-18 DIAGNOSIS — Z6831 Body mass index (BMI) 31.0-31.9, adult: Secondary | ICD-10-CM | POA: Diagnosis not present

## 2017-07-18 DIAGNOSIS — Z01419 Encounter for gynecological examination (general) (routine) without abnormal findings: Secondary | ICD-10-CM | POA: Diagnosis not present

## 2017-08-15 DIAGNOSIS — H5231 Anisometropia: Secondary | ICD-10-CM | POA: Diagnosis not present

## 2017-09-16 ENCOUNTER — Encounter: Payer: Self-pay | Admitting: Internal Medicine

## 2017-09-17 ENCOUNTER — Other Ambulatory Visit: Payer: Self-pay | Admitting: Internal Medicine

## 2017-09-17 MED ORDER — ESCITALOPRAM OXALATE 10 MG PO TABS: 10.0000 mg | ORAL_TABLET | Freq: Every day | ORAL | 5 refills | Status: DC

## 2017-09-17 MED ORDER — CYANOCOBALAMIN 1000 MCG/ML IJ SOLN: 1000.0000 ug | INTRAMUSCULAR | 3 refills | Status: DC

## 2017-09-27 ENCOUNTER — Ambulatory Visit (INDEPENDENT_AMBULATORY_CARE_PROVIDER_SITE_OTHER): Payer: Self-pay | Admitting: Family Medicine

## 2017-09-27 VITALS — BP 110/80 | HR 98 | Temp 100.8°F | Wt 198.0 lb

## 2017-09-27 DIAGNOSIS — J101 Influenza due to other identified influenza virus with other respiratory manifestations: Secondary | ICD-10-CM

## 2017-09-27 LAB — POCT INFLUENZA A/B
INFLUENZA A, POC: POSITIVE — AB
Influenza B, POC: NEGATIVE

## 2017-09-27 MED ORDER — OSELTAMIVIR PHOSPHATE 75 MG PO CAPS: 75.0000 mg | ORAL_CAPSULE | Freq: Two times a day (BID) | ORAL | 0 refills | Status: DC

## 2017-09-27 MED ORDER — IBUPROFEN 600 MG PO TABS
600.0000 mg | ORAL_TABLET | Freq: Four times a day (QID) | ORAL | 0 refills | Status: DC | PRN
Start: 2017-09-27 — End: 2017-09-27

## 2017-09-27 NOTE — Patient Instructions (Signed)
Influenza, Adult Influenza ("the flu") is an infection in the lungs, nose, and throat (respiratory tract). It is caused by a virus. The flu causes many common cold symptoms, as well as a high fever and body aches. It can make you feel very sick. The flu spreads easily from person to person (is contagious). Getting a flu shot (influenza vaccination) every year is the best way to prevent the flu. Follow these instructions at home:  Take over-the-counter and prescription medicines only as told by your doctor.  Use a cool mist humidifier to add moisture (humidity) to the air in your home. This can make it easier to breathe.  Rest as needed.  Drink enough fluid to keep your pee (urine) clear or pale yellow.  Cover your mouth and nose when you cough or sneeze.  Wash your hands with soap and water often, especially after you cough or sneeze. If you cannot use soap and water, use hand sanitizer.  Stay home from work or school as told by your doctor. Unless you are visiting your doctor, try to avoid leaving home until your fever has been gone for 24 hours without the use of medicine.  Keep all follow-up visits as told by your doctor. This is important. How is this prevented?  Getting a yearly (annual) flu shot is the best way to avoid getting the flu. You may get the flu shot in late summer, fall, or winter. Ask your doctor when you should get your flu shot.  Wash your hands often or use hand sanitizer often.  Avoid contact with people who are sick during cold and flu season.  Eat healthy foods.  Drink plenty of fluids.  Get enough sleep.  Exercise regularly. Contact a doctor if:  You get new symptoms.  You have: ? Chest pain. ? Watery poop (diarrhea). ? A fever.  Your cough gets worse.  You start to have more mucus.  You feel sick to your stomach (nauseous).  You throw up (vomit). Get help right away if:  You start to be short of breath or have trouble breathing.  Your  skin or nails turn a bluish color.  You have very bad pain or stiffness in your neck.  You get a sudden headache.  You get sudden pain in your face or ear.  You cannot stop throwing up. This information is not intended to replace advice given to you by your health care provider. Make sure you discuss any questions you have with your health care provider. Document Released: 03/13/2008 Document Revised: 11/10/2015 Document Reviewed: 03/29/2015 Elsevier Interactive Patient Education  2017 [employer].

## 2017-09-27 NOTE — Progress Notes (Addendum)
Patient ID: Alicia Blair, female    DOB: [birth year], 42 y.o.   MRN: [id number]  PCP: Crecencio Mc, MD  Chief Complaint  Patient presents with  . choice-fever/leg pain/ha    Subjective:  HPI Alicia Blair is a 42 y.o. female who complains of headache and fevers up to 101 degrees for one day. Alicia Blair endorses headache, arthralgias, and dry cough. She is a non-smoker. Denies chest tightness, wheezing, shortness of breath, weakness, or dizziness. She denies a history of asthma, bronchitis, or pneumonia. 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 Social History   Socioeconomic History  . Marital status: Married    Spouse name: Not on file  . Number of children: Not on file  . Years of education: Not on file  . Highest education level: Not on file  Occupational History  . Not on file  Social Needs  . Financial resource strain: Not on file  . Food insecurity:    Worry: Not on file    Inability: Not on file  . Transportation needs:    Medical: Not on file    Non-medical: Not on file  Tobacco Use  . Smoking status: Never Smoker  . Smokeless tobacco: Never Used  Substance and Sexual Activity  . Alcohol use: Yes    Alcohol/week: 1.8 oz    Types: 3 Cans of beer per week    Comment: social, none since pregnanc y  . Drug use: No  . Sexual activity: Yes    Birth control/protection: None  Lifestyle  . Physical activity:    Days per week: Not on file    Minutes per session: Not on file  . Stress: Not on file  Relationships  . Social connections:    Talks on phone: Not on file    Gets together: Not on file    Attends religious service: Not on file    Active member of club or organization: Not on file    Attends meetings of clubs or organizations: Not on file    Relationship status: Not on file  . Intimate partner violence:    Fear of current or ex partner: Not on file    Emotionally abused: Not on file   Physically abused: Not on file    Forced sexual activity: Not on file  Other Topics Concern  . 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    Family History  Problem Relation Age of Onset  . Diabetes Mother   . Cancer Mother 48       endometrial CA  . Diabetes Maternal Aunt   . Diabetes Maternal Grandmother   . Cancer Brother 21       testicular ca  . Diabetes Brother    Review of Systems  Pertinent negatives includes in HPI   Patient Active Problem List   Diagnosis Date Noted  . B12 deficiency 04/07/2017  . PIH (pregnancy induced hypertension) 05/14/2016  . SVD (spontaneous vaginal delivery) 05/14/2016  . Screening for cervical cancer 02/12/2015  . Vaginitis and vulvovaginitis 05/07/2014  . Needlestick injury accident with exposure to body fluid 01/18/2014  . Menorrhagia 01/18/2014  . Visit for preventive health examination 01/18/2014  . History of motion sickness 07/12/2013  . Irritation of vulva 02/27/2013  . Screening for lipoid disorders 06/01/2012  . Screening for diabetes mellitus 06/01/2012  . Skin lesion of face 06/01/2012  .  Obesity due to excess calories without serious comorbidity 06/01/2012  . Psoriasis 05/27/2012    Allergies  Allergen Reactions  . Codeine Nausea And Vomiting  . Erythromycin Nausea And Vomiting    Prior to Admission medications   Medication Sig Start Date End Date Taking? Authorizing Provider  cyanocobalamin (,VITAMIN B-12,) 1000 MCG/ML injection Inject 1 mL (1,000 mcg total) into the muscle every 30 (thirty) days. For 4 doses,  Then monthly thereafter 09/17/17  Yes Crecencio Mc, MD  fluticasone (FLONASE) 50 MCG/ACT nasal spray Place 2 sprays into both nostrils daily. 05/08/17  Yes Fisher, Linden Dolin, PA-C  Prenatal Vit-Fe Fumarate-FA (PRENATAL MULTIVITAMIN) TABS tablet Take 1 tablet by mouth daily.    Yes [provider]  escitalopram (LEXAPRO) 10 MG tablet Take 1 tablet (10 mg total) by mouth daily. 09/17/17    Crecencio Mc, MD  predniSONE (DELTASONE) 10 MG tablet 6 tablets on Day 1 , then reduce by 1 tablet daily until gone Patient not taking: Reported on 05/08/2017 04/29/17   Crecencio Mc, MD  Syringe/Needle, Disp, (SYRINGE 3CC/25GX1") 25G X 1" 3 ML MISC Use for b12 injections Patient not taking: Reported on 09/27/2017 04/07/17   Crecencio Mc, MD    Past Medical, Surgical Family and Social History reviewed and updated.    Objective:   Today's Vitals   09/27/17 0836  BP: 110/80  Pulse: 98  Temp: (!) 100.8 F (38.2 C)  SpO2: 98%  Weight: 198 lb (89.8 kg)    Wt Readings from Last 3 Encounters:  09/27/17 198 lb (89.8 kg)  04/05/17 195 lb 12.8 oz (88.8 kg)  05/14/16 217 lb (98.4 kg)    Physical Exam  Constitutional: She is oriented to person, place, and time. She appears well-developed and well-nourished.  Non-ill appearing   HENT:  Head: Atraumatic.  Right Ear: External ear normal.  Left Ear: External ear normal.  Nose: Nose normal.  Mouth/Throat: Oropharynx is clear and moist.  Neck: Normal range of motion. Neck supple.  Cardiovascular: Regular rhythm and normal pulses.  Heart beat intensity bounding   Pulmonary/Chest: Effort normal and breath sounds normal.  Musculoskeletal: Normal range of motion.  Lymphadenopathy:    She has no cervical adenopathy.  Neurological: She is alert and oriented to person, place, and time.  Skin: Skin is warm.  Slightly diaphoretic   Psychiatric: She has a normal mood and affect. Her behavior is normal. Judgment and thought content normal.   Assessment & Plan:  1. Influenza A, positive for ifluenza A Will start Tamiflu 75 mg, twice daily x 5 days. Continue Naproxen OTC as needed for arthralgia, fever, and headache.  Educated on symptoms associated with complications of influenza. Recommended other family members within household receive treatment with prevention therapy.    If symptoms worsen or do not improve, return for  follow-up, follow-up with PCP, or at the emergency department if severity of symptoms warrant a higher level of care.    Carroll Sage. Kenton Kingfisher, MSN, [place], [place] [phone]

## 2017-10-01 ENCOUNTER — Telehealth: Payer: Self-pay | Admitting: Emergency Medicine

## 2017-10-01 NOTE — Telephone Encounter (Signed)
Spoke with patient feeling so much better she stated Went back to work on Monday.

## 2017-10-29 DIAGNOSIS — L400 Psoriasis vulgaris: Secondary | ICD-10-CM | POA: Diagnosis not present

## 2017-10-29 DIAGNOSIS — L0101 Non-bullous impetigo: Secondary | ICD-10-CM | POA: Diagnosis not present

## 2017-12-24 DIAGNOSIS — H1013 Acute atopic conjunctivitis, bilateral: Secondary | ICD-10-CM | POA: Diagnosis not present

## 2018-02-20 DIAGNOSIS — L298 Other pruritus: Secondary | ICD-10-CM | POA: Diagnosis not present

## 2018-02-20 DIAGNOSIS — L811 Chloasma: Secondary | ICD-10-CM | POA: Diagnosis not present

## 2018-02-20 DIAGNOSIS — D225 Melanocytic nevi of trunk: Secondary | ICD-10-CM | POA: Diagnosis not present

## 2018-02-20 DIAGNOSIS — L820 Inflamed seborrheic keratosis: Secondary | ICD-10-CM | POA: Diagnosis not present

## 2018-02-20 DIAGNOSIS — L538 Other specified erythematous conditions: Secondary | ICD-10-CM | POA: Diagnosis not present

## 2018-02-20 DIAGNOSIS — D2261 Melanocytic nevi of right upper limb, including shoulder: Secondary | ICD-10-CM | POA: Diagnosis not present

## 2018-02-20 DIAGNOSIS — D2271 Melanocytic nevi of right lower limb, including hip: Secondary | ICD-10-CM | POA: Diagnosis not present

## 2018-02-20 DIAGNOSIS — D2272 Melanocytic nevi of left lower limb, including hip: Secondary | ICD-10-CM | POA: Diagnosis not present

## 2018-02-20 DIAGNOSIS — L400 Psoriasis vulgaris: Secondary | ICD-10-CM | POA: Diagnosis not present

## 2018-02-20 DIAGNOSIS — D2262 Melanocytic nevi of left upper limb, including shoulder: Secondary | ICD-10-CM | POA: Diagnosis not present

## 2018-02-20 DIAGNOSIS — D485 Neoplasm of uncertain behavior of skin: Secondary | ICD-10-CM | POA: Diagnosis not present

## 2018-02-20 DIAGNOSIS — R208 Other disturbances of skin sensation: Secondary | ICD-10-CM | POA: Diagnosis not present

## 2018-02-27 DIAGNOSIS — B001 Herpesviral vesicular dermatitis: Secondary | ICD-10-CM | POA: Diagnosis not present

## 2018-02-27 DIAGNOSIS — L089 Local infection of the skin and subcutaneous tissue, unspecified: Secondary | ICD-10-CM | POA: Diagnosis not present

## 2018-03-04 DIAGNOSIS — Z1231 Encounter for screening mammogram for malignant neoplasm of breast: Secondary | ICD-10-CM | POA: Diagnosis not present

## 2018-03-05 DIAGNOSIS — L309 Dermatitis, unspecified: Secondary | ICD-10-CM | POA: Diagnosis not present

## 2018-03-05 DIAGNOSIS — L308 Other specified dermatitis: Secondary | ICD-10-CM | POA: Diagnosis not present

## 2018-04-10 ENCOUNTER — Encounter: Payer: Self-pay | Admitting: Internal Medicine

## 2018-04-18 DIAGNOSIS — Z4651 Encounter for fitting and adjustment of gastric lap band: Secondary | ICD-10-CM | POA: Diagnosis not present

## 2018-04-23 ENCOUNTER — Ambulatory Visit (INDEPENDENT_AMBULATORY_CARE_PROVIDER_SITE_OTHER): Payer: 59 | Admitting: Internal Medicine

## 2018-04-23 ENCOUNTER — Encounter: Payer: Self-pay | Admitting: Internal Medicine

## 2018-04-23 VITALS — BP 114/76 | HR 80 | Temp 98.0°F | Resp 15 | Ht 67.0 in | Wt 199.6 lb

## 2018-04-23 DIAGNOSIS — R5383 Other fatigue: Secondary | ICD-10-CM | POA: Diagnosis not present

## 2018-04-23 DIAGNOSIS — Z6832 Body mass index (BMI) 32.0-32.9, adult: Secondary | ICD-10-CM

## 2018-04-23 DIAGNOSIS — E7800 Pure hypercholesterolemia, unspecified: Secondary | ICD-10-CM | POA: Diagnosis not present

## 2018-04-23 DIAGNOSIS — Z131 Encounter for screening for diabetes mellitus: Secondary | ICD-10-CM

## 2018-04-23 DIAGNOSIS — Z13220 Encounter for screening for lipoid disorders: Secondary | ICD-10-CM | POA: Diagnosis not present

## 2018-04-23 DIAGNOSIS — R7301 Impaired fasting glucose: Secondary | ICD-10-CM | POA: Diagnosis not present

## 2018-04-23 DIAGNOSIS — Z0000 Encounter for general adult medical examination without abnormal findings: Secondary | ICD-10-CM | POA: Diagnosis not present

## 2018-04-23 DIAGNOSIS — E6609 Other obesity due to excess calories: Secondary | ICD-10-CM

## 2018-04-23 DIAGNOSIS — E538 Deficiency of other specified B group vitamins: Secondary | ICD-10-CM

## 2018-04-23 MED ORDER — ACYCLOVIR 400 MG PO TABS: 400.0000 mg | ORAL_TABLET | Freq: Every day | ORAL | 1 refills | Status: DC

## 2018-04-23 MED ORDER — IBUPROFEN 600 MG PO TABS: 600.0000 mg | ORAL_TABLET | Freq: Three times a day (TID) | ORAL | 0 refills | Status: DC | PRN

## 2018-04-23 MED ORDER — PREDNISONE 10 MG PO TABS: ORAL_TABLET | ORAL | 0 refills | Status: DC

## 2018-04-23 NOTE — Progress Notes (Addendum)
Patient ID: Alicia Blair, female    DOB: [birth year]  Age: 42 y.o. MRN: [id number]  The patient is here for annual preventive examination and management of other chronic and acute problems.  HPV neg PAP Feb 2019  Normal mammo Sept 2019  Has Breast implants    The risk factors are reflected in the social history.  The roster of all physicians providing medical care to patient - is listed in the Snapshot section of the chart.  Activities of daily living:  The patient is 100% independent in all ADLs: dressing, toileting, feeding as well as independent mobility  Home safety : The patient has smoke detectors in the home. They wear seatbelts.  There are no firearms at home. There is no violence in the home.   There is no risks for hepatitis, STDs or HIV. There is no   history of blood transfusion. They have no travel history to infectious disease endemic areas of the world.  The patient has seen their dentist in the last six month. They have seen their eye doctor in the last year. They admit to slight hearing difficulty with regard to whispered voices and some television programs.  They have deferred audiologic testing in the last year.  They do not  have excessive sun exposure. Discussed the need for sun protection: hats, long sleeves and use of sunscreen if there is significant sun exposure.   Diet: the importance of a healthy diet is discussed. They do have a healthy diet.  The benefits of regular aerobic exercise were discussed. She walks 4 times per week ,  20 minutes.   Depression screen: there are no signs or vegative symptoms of depression- irritability, change in appetite, anhedonia, sadness/tearfullness.  Cognitive assessment: the patient manages all their financial and personal affairs and is actively engaged. They could relate day,date,year and events; recalled 2/3 objects at 3 minutes; performed clock-face test normally.  The following portions of the patient's history were reviewed  and updated as appropriate: allergies, current medications, past family history, past medical history,  past surgical history, past social history  and problem list.  Visual acuity was not assessed per patient preference since she has regular follow up with her ophthalmologist. Hearing and body mass index were assessed and reviewed.   During the course of the visit the patient was educated and counseled about appropriate screening and preventive services including : fall prevention , diabetes screening, nutrition counseling, colorectal cancer screening, and recommended immunizations.    CC: The primary encounter diagnosis was B12 deficiency. Diagnoses of Screening for diabetes mellitus, Screening for lipoid disorders, Visit for preventive health examination, Fatigue, unspecified type, Impaired fasting glucose, Pure hypercholesterolemia, and Class 1 obesity due to excess calories without serious comorbidity with body mass index (BMI) of 32.0 to 32.9 in adult were also pertinent to this visit.   Obesity:  S/p Lap band remotely  Has been too restrictive in the past  . Now establisheed with bariatician at [facility]  Goal is 187 she wants it to be 180 . 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  Referring to surgeon at [facility] has  a past medical history of Anemia, Asthma, Chicken pox, Complication of anesthesia, and PONV (postoperative nausea and vomiting).   She has a past surgical history that includes Laparoscopic gastric banding (2008); breast augmentation (2003); Hernia repair; and Wisdom tooth extraction.   Her family history includes Cancer (age of onset: 61) in her brother; Cancer (age of onset: 57) in her mother; Diabetes in  her brother, maternal aunt, maternal grandmother, and mother.She reports that she has never smoked. She has never used smokeless tobacco. She reports that she drinks about 3.0 standard drinks of alcohol per week. She reports that she does not use drugs.  Outpatient Medications Prior to Visit  Medication Sig Dispense Refill  . Cyanocobalamin 1000 MCG/ML KIT Inject as directed.    . fluticasone (FLONASE) 50 MCG/ACT nasal spray Place 2 sprays into both nostrils daily. 16 g 6  . cyanocobalamin (,VITAMIN B-12,) 1000 MCG/ML injection Inject 1 mL (1,000 mcg total) into the muscle every 30 (thirty) days. For 4 doses,  Then monthly thereafter (Patient not taking: Reported on 04/23/2018) 10 mL 3  . escitalopram (LEXAPRO) 10 MG tablet Take 1 tablet (10 mg total) by mouth daily. (Patient not taking: Reported on 04/23/2018) 30 tablet 5  . oseltamivir (TAMIFLU) 75 MG capsule Take 1 capsule (75 mg total) by mouth 2 (two) times daily. (Patient not taking: Reported on 04/23/2018) 10 capsule 0  . predniSONE (DELTASONE) 10 MG tablet 6 tablets on Day 1 , then reduce by 1 tablet daily until gone (Patient not taking: Reported on 05/08/2017) 21 tablet 0  . Prenatal Vit-Fe Fumarate-FA (PRENATAL MULTIVITAMIN) TABS tablet Take 1 tablet by mouth daily.     . Syringe/Needle, Disp, (SYRINGE 3CC/25GX1") 25G X 1" 3 ML MISC Use for b12 injections (Patient not taking: Reported on 09/27/2017) 50 each 0   No facility-administered medications prior to visit.     Review of Systems   Patient denies headache, fevers, malaise, unintentional weight loss, skin rash, eye pain, sinus congestion and sinus pain, sore throat, dysphagia,  hemoptysis , cough, dyspnea, wheezing, chest pain, palpitations, orthopnea, edema, abdominal pain, nausea, melena, diarrhea, constipation, flank pain, dysuria, hematuria, urinary  Frequency, nocturia, numbness, tingling, seizures,  Focal weakness, Loss of consciousness,  Tremor, insomnia, depression, anxiety, and  suicidal ideation.      Objective:  BP 114/76 (BP Location: Left Arm, Patient Position: Sitting, Cuff Size: Large)   Pulse 80   Temp 98 F (36.7 C) (Oral)   Resp 15   Ht _0  (1.702 m)   Wt 199 lb 9.6 oz (90.5 kg)   SpO2 98%   BMI 31.26 kg/m   Physical Exam   General appearance: alert, cooperative and appears stated age Ears: normal TM's and external ear canals both ears Throat: lips, mucosa, and tongue normal; teeth and gums normal Neck: no adenopathy, no carotid bruit, supple, symmetrical, trachea midline and thyroid not enlarged, symmetric, no tenderness/mass/nodules Back: symmetric, no curvature. ROM normal. No CVA tenderness. Lungs: clear to auscultation bilaterally Heart: regular rate and rhythm, S1, S2 normal, no murmur, click, rub or gallop Abdomen: soft, non-tender; bowel sounds normal; no masses,  no organomegaly Pulses: 2+ and symmetric Skin: Skin color, texture, turgor normal. No rashes or lesions Lymph nodes: Cervical, supraclavicular, and axillary nodes normal.   Assessment & Plan:   Problem List Items Addressed This Visit    B12 deficiency - Primary    Etiology may be iatrogenic due to lap band surgical history .  She has been replacing  with  IM injections   Lab Results  Component Value Date   VITAMINB12 202 (L) 04/05/2017        Relevant Orders   Vitamin B12 (Completed)   Obesity due to excess calories without serious comorbidity    I have addressed  BMI and recommended wt loss of 10% of body weigh over the next 6 months using a low glycemic index diet and regular exercise a minimum of 5 days per week.        Screening for diabetes mellitus    A1cs have been elevated but not in prediabetic range.  Low GI diet recommended and regular exercise. Lab Results  Component Value Date   HGBA1C 5.6 04/05/2017         Screening for lipoid disorders   Visit for preventive health examination    Annual comprehensive preventive exam was done as well as  an evaluation and management of chronic conditions .  During the course of the visit the patient was educated and counseled about appropriate screening and preventive services including :  diabetes screening, lipid analysis with projected  10 year  risk for CAD , nutrition counseling, breast, cervical and colorectal cancer screening, and recommended immunizations.  Printed recommendations for health maintenance screenings was given       Other Visit Diagnoses    Fatigue, unspecified type       Relevant Orders   TSH (Completed)   CBC with Differential/Platelet (Completed)   Impaired fasting glucose       Relevant Orders   Comprehensive metabolic panel (Completed)   Hemoglobin A1c (Completed)   Pure hypercholesterolemia       Relevant Orders   Lipid panel (Completed)      I have discontinued Alicia Blair. Alicia Blair's prenatal multivitamin, SYRINGE 3CC/25GX1", predniSONE, cyanocobalamin, escitalopram, and oseltamivir. I am also having her start on acyclovir and ibuprofen.  Meds ordered this encounter  Medications  . acyclovir (ZOVIRAX) 400 MG tablet    Sig: Take 1 tablet (400 mg total) by mouth 5 (five) times daily.    Dispense:  35 tablet    Refill:  1    KEEP ON FILE FOR FUTURE REFILLS  . DISCONTD: predniSONE (DELTASONE) 10 MG tablet    Sig: 6 tablets on Day 1 , then reduce by 1 tablet daily until gone    Dispense:  21 tablet    Refill:  0    KEEP ON FILE FOR FUTURE REFILLS  . ibuprofen (ADVIL,MOTRIN) 600 MG tablet    Sig: Take 1 tablet (600 mg total) by mouth every 8 (eight) hours as needed.    Dispense:  30 tablet    Refill:  0    KEEP ON FILE FOR FUTURE REFILLS  A total of 40 minutes was spent with patient more than half of which was spent in counseling patient on the above mentioned issues , reviewing and explaining recent labs and imaging studies done, and coordination of care.  Medications Discontinued During This Encounter  Medication Reason  . cyanocobalamin (,VITAMIN B-12,)  1000 MCG/ML injection Error  . escitalopram (LEXAPRO) 10 MG tablet Error  . oseltamivir (TAMIFLU) 75 MG capsule Error  . predniSONE (DELTASONE) 10 MG tablet Error  . Prenatal Vit-Fe Fumarate-FA (PRENATAL MULTIVITAMIN) TABS tablet Error  . Syringe/Needle, Disp, (SYRINGE 3CC/25GX1") 25G X 1" 3 ML MISC Error    Follow-up: No follow-ups on file.   Crecencio Mc, MD

## 2018-04-24 NOTE — Assessment & Plan Note (Signed)
A1cs have been elevated but not in prediabetic range.  Low GI diet recommended and regular exercise. Lab Results  Component Value Date   HGBA1C 5.6 04/05/2017

## 2018-04-24 NOTE — Assessment & Plan Note (Signed)
I have addressed  BMI and recommended wt loss of 10% of body weigh over the next 6 months using a low glycemic index diet and regular exercise a minimum of 5 days per week.   

## 2018-04-24 NOTE — Assessment & Plan Note (Signed)
Etiology may be iatrogenic due to lap band surgical history .  She has been replacing  with  IM injections   Lab Results  Component Value Date   VITAMINB12 202 (L) 04/05/2017

## 2018-05-01 ENCOUNTER — Other Ambulatory Visit (INDEPENDENT_AMBULATORY_CARE_PROVIDER_SITE_OTHER): Payer: 59

## 2018-05-01 DIAGNOSIS — R5383 Other fatigue: Secondary | ICD-10-CM | POA: Diagnosis not present

## 2018-05-01 DIAGNOSIS — R7301 Impaired fasting glucose: Secondary | ICD-10-CM

## 2018-05-01 DIAGNOSIS — E538 Deficiency of other specified B group vitamins: Secondary | ICD-10-CM | POA: Diagnosis not present

## 2018-05-01 DIAGNOSIS — E7800 Pure hypercholesterolemia, unspecified: Secondary | ICD-10-CM

## 2018-05-01 LAB — COMPREHENSIVE METABOLIC PANEL
ALK PHOS: 41 U/L (ref 39–117)
ALT: 11 U/L (ref 0–35)
AST: 13 U/L (ref 0–37)
Albumin: 4.3 g/dL (ref 3.5–5.2)
BILIRUBIN TOTAL: 0.5 mg/dL (ref 0.2–1.2)
BUN: 10 mg/dL (ref 6–23)
CO2: 27 meq/L (ref 19–32)
Calcium: 9.2 mg/dL (ref 8.4–10.5)
Chloride: 105 mEq/L (ref 96–112)
Creatinine, Ser: 0.62 mg/dL (ref 0.40–1.20)
GFR: 112.02 mL/min (ref >60.00)
GLUCOSE: 99 mg/dL (ref 70–99)
Potassium: 4.4 mEq/L (ref 3.5–5.1)
SODIUM: 138 meq/L (ref 135–145)
Total Protein: 6.8 g/dL (ref 6.0–8.3)

## 2018-05-01 LAB — CBC WITH DIFFERENTIAL/PLATELET
BASOS PCT: 0.6 % (ref 0.0–3.0)
Basophils Absolute: 0 10*3/uL (ref 0.0–0.1)
EOS ABS: 0.2 10*3/uL (ref 0.0–0.7)
EOS PCT: 3.3 % (ref 0.0–5.0)
HCT: 38.4 % (ref 36.0–46.0)
HEMOGLOBIN: 12.9 g/dL (ref 12.0–15.0)
LYMPHS ABS: 2 10*3/uL (ref 0.7–4.0)
Lymphocytes Relative: 32.8 % (ref 12.0–46.0)
MCHC: 33.6 g/dL (ref 30.0–36.0)
MCV: 87.2 fl (ref 78.0–100.0)
MONO ABS: 0.6 10*3/uL (ref 0.1–1.0)
Monocytes Relative: 9 % (ref 3.0–12.0)
NEUTROS PCT: 54.3 % (ref 43.0–77.0)
Neutro Abs: 3.3 10*3/uL (ref 1.4–7.7)
PLATELETS: 270 10*3/uL (ref 150.0–400.0)
RBC: 4.41 Mil/uL (ref 3.87–5.11)
RDW: 14.3 % (ref 11.5–15.5)
WBC: 6.2 10*3/uL (ref 4.0–10.5)

## 2018-05-01 LAB — LIPID PANEL
Cholesterol: 192 mg/dL (ref 0–200)
HDL: 60 mg/dL (ref >39.00)
LDL Cholesterol: 119 mg/dL — ABNORMAL HIGH (ref 0–99)
NONHDL: 132.37
Total CHOL/HDL Ratio: 3
Triglycerides: 68 mg/dL (ref 0.0–149.0)
VLDL: 13.6 mg/dL (ref 0.0–40.0)

## 2018-05-01 LAB — HEMOGLOBIN A1C: Hgb A1c MFr Bld: 5.7 % (ref 4.6–6.5)

## 2018-05-01 LAB — VITAMIN B12: VITAMIN B 12: 181 pg/mL — AB (ref 211–911)

## 2018-05-01 LAB — TSH: TSH: 3.53 u[IU]/mL (ref 0.35–4.50)

## 2018-05-05 ENCOUNTER — Other Ambulatory Visit: Payer: Self-pay | Admitting: Internal Medicine

## 2018-05-05 MED ORDER — CYANOCOBALAMIN 1000 MCG/ML IJ SOLN: 1000.0000 ug | INTRAMUSCULAR | 0 refills | Status: DC

## 2018-05-10 ENCOUNTER — Other Ambulatory Visit: Payer: Self-pay

## 2018-05-10 ENCOUNTER — Ambulatory Visit
Admission: EM | Admit: 2018-05-10 | Discharge: 2018-05-10 | Disposition: A | Discharge status: Home / Self Care | Payer: 59 | Attending: Family Medicine | Admitting: Family Medicine

## 2018-05-10 ENCOUNTER — Encounter: Payer: Self-pay | Admitting: Emergency Medicine

## 2018-05-10 DIAGNOSIS — J029 Acute pharyngitis, unspecified: Secondary | ICD-10-CM | POA: Diagnosis not present

## 2018-05-10 DIAGNOSIS — J020 Streptococcal pharyngitis: Secondary | ICD-10-CM | POA: Diagnosis not present

## 2018-05-10 LAB — RAPID STREP SCREEN (MED CTR MEBANE ONLY): STREPTOCOCCUS, GROUP A SCREEN (DIRECT): POSITIVE — AB

## 2018-05-10 MED ORDER — PENICILLIN G BENZATHINE 1200000 UNIT/2ML IM SUSP
1.2000 10*6.[IU] | Freq: Once | INTRAMUSCULAR | Status: AC
  Administered 2018-05-10: 1.2 10*6.[IU] via INTRAMUSCULAR

## 2018-05-10 MED ORDER — DEXAMETHASONE SODIUM PHOSPHATE 10 MG/ML IJ SOLN
10.0000 mg | Freq: Once | INTRAMUSCULAR | Status: AC
  Administered 2018-05-10: 10 mg via INTRAMUSCULAR

## 2018-05-10 NOTE — ED Provider Notes (Signed)
[facility] ____________________________________________  Time seen: Approximately 1:04 PM  I have reviewed the triage vital signs and the nursing notes.   HISTORY  Chief Complaint Sore Throat   HPI Alicia Blair is a 42 y.o. female presenting for evaluation of sore throat present since last night.  Reports some accompanying body aches and chills that started this morning.  States some nausea this morning no vomiting.  Did take Zofran prior to arrival.  States sore throat is moderate.  Has continued to tolerate food and fluids.  No accompanying chest pain, shortness of breath, abdominal pain, cough or congestion.  Reports has been positive for strep throat this past week.  Denies other aggravating alleviating factors.  Reports otherwise doing well denies recent sickness.  Denies recent antibiotic use.  Crecencio Mc, MD: PCP Patient's last menstrual period was 05/03/2018.Denies pregnancy.    Past Medical History:  Diagnosis Date  . Anemia    with pregnancy  . Asthma    as a child  . Chicken pox   . Complication of anesthesia   . PONV (postoperative nausea and vomiting)     Patient Active Problem List   Diagnosis Date Noted  . B12 deficiency 04/07/2017  . PIH (pregnancy induced hypertension) 05/14/2016  . SVD (spontaneous vaginal delivery) 05/14/2016  . Screening for cervical cancer 02/12/2015  . Needlestick injury accident with exposure to body fluid 01/18/2014  . Menorrhagia 01/18/2014  . Visit for preventive health examination 01/18/2014  . History of motion sickness 07/12/2013  . Screening for lipoid disorders 06/01/2012  . Screening for diabetes mellitus 06/01/2012  . Skin lesion of face 06/01/2012  . Obesity due to excess calories without serious comorbidity 06/01/2012  . Psoriasis 05/27/2012    Past Surgical History:  Procedure Laterality Date  . breast augmentation  2003  . HERNIA REPAIR    . LAPAROSCOPIC GASTRIC BANDING  2008  . WISDOM  TOOTH EXTRACTION       No current facility-administered medications for this encounter.   Current Outpatient Medications:  .  cyanocobalamin (,VITAMIN B-12,) 1000 MCG/ML injection, Inject 1 mL (1,000 mcg total) into the muscle every 30 (thirty) days. Following 3 weekly injections, Disp: 10 mL, Rfl: 0 .  acyclovir (ZOVIRAX) 400 MG tablet, Take 1 tablet (400 mg total) by mouth 5 (five) times daily., Disp: 35 tablet, Rfl: 1 .  ibuprofen (ADVIL,MOTRIN) 600 MG tablet, Take 1 tablet (600 mg total) by mouth every 8 (eight) hours as needed., Disp: 30 tablet, Rfl: 0  Allergies Codeine and Erythromycin  Family History  Problem Relation Age of Onset  . Diabetes Mother   . Cancer Mother 69       endometrial CA  . Diabetes Maternal Aunt   . Diabetes Maternal Grandmother   . Cancer Brother 21       testicular ca  . Diabetes Brother     Social History Social History   Tobacco Use  . Smoking status: Never Smoker  . Smokeless tobacco: Never Used  Substance Use Topics  . Alcohol use: Yes    Alcohol/week: 3.0 standard drinks    Types: 3 Cans of beer per week    Comment: social, none since pregnanc y  . Drug use: No    Review of Systems Constitutional:possible fever. ENT: positive sore throat. Cardiovascular: Denies chest pain. Respiratory: Denies shortness of breath. Gastrointestinal: No abdominal pain.  no vomiting.  No diarrhea.   Musculoskeletal: Negative for back pain. Skin: Negative for rash.  ____________________________________________  PHYSICAL EXAM:  VITAL SIGNS: ED Triage Vitals  Enc Vitals Group     BP 05/10/18 1110 131/80     Pulse Rate 05/10/18 1110 100     Resp 05/10/18 1110 14     Temp 05/10/18 1110 99.6 F (37.6 C)     Temp Source 05/10/18 1110 Oral     SpO2 05/10/18 1110 100 %     Weight 05/10/18 1108 197 lb (89.4 kg)     Height 05/10/18 1108 5' 7"  (1.702 m)     Head Circumference --      Peak Flow --      Pain Score 05/10/18 1107 1     Pain Loc --       Pain Edu? --      Excl. in [place]? --     Constitutional: Alert and oriented. Well appearing and in no acute distress. Eyes: Conjunctivae are normal. Head: Atraumatic. No sinus tenderness to palpation. No swelling. No erythema.  Ears: no erythema, normal TMs bilaterally.   Nose:No nasal congestion   Mouth/Throat: Mucous membranes are moist. Mild to moderate pharyngeal erythema. 2+ bilateral tonsillar swelling. No exudate.  Neck: No stridor.  No cervical spine tenderness to palpation. Hematological/Lymphatic/Immunilogical: anterior bilateral cervical lymphadenopathy. Cardiovascular: Normal rate, regular rhythm. Grossly normal heart sounds.  Good peripheral circulation. Respiratory: Normal respiratory effort.  No retractions. No wheezes, rales or rhonchi. Good air movement.  Musculoskeletal: Ambulatory with steady gait. No cervical, thoracic or lumbar tenderness to palpation. Neurologic:  Normal speech and language. No gait instability. Skin:  Skin appears warm, dry and intact. No rash noted. Psychiatric: Mood and affect are normal. Speech and behavior are normal.  ___________________________________________   LABS (all labs ordered are listed, but only abnormal results are displayed)  Labs Reviewed  RAPID STREP SCREEN ([facility] ONLY) - Abnormal; Notable for the following components:      Result Value   Streptococcus, Group A Screen (Direct) POSITIVE (*)    All other components within normal limits   PROCEDURES  INITIAL IMPRESSION / ASSESSMENT AND PLAN / ED COURSE  Pertinent labs & imaging results that were available during my care of the patient were reviewed by me and considered in my medical decision making (see chart for details).  Well-appearing patient.  No acute distress.  Strep positive.  Discussed treatment options with patient.  Will treat with Bicillin.  1,200,000 units Bicillin IM given in urgent care as well as 10 mg IM Decadron.  Encourage rest, fluids,  supportive care.Discussed indication, risks and benefits of medications with patient.  Discussed follow up with Primary care physician this week as needed.. Discussed follow up and return parameters including no resolution or any worsening concerns. Patient verbalized understanding and agreed to plan.   ____________________________________________   FINAL CLINICAL IMPRESSION(S) / ED DIAGNOSES  Final diagnoses:  Strep pharyngitis     ED Discharge Orders    None       Note: This dictation was prepared with Dragon dictation along with smaller phrase technology. Any transcriptional errors that result from this process are unintentional.         Marylene Land, NP 05/10/18 1318

## 2018-05-10 NOTE — ED Notes (Signed)
Patient shows no signs of adverse reaction to medication at this time.

## 2018-05-10 NOTE — ED Triage Notes (Signed)
Patient states that her husband is being treated for Strep Throat.  Patient c/o sore throat, bodyaches, and HAs that started last night.

## 2018-05-10 NOTE — Discharge Instructions (Addendum)
Rest. Drink plenty of fluids.   Follow up with your primary care physician this week as needed. Return to Urgent care for new or worsening concerns.

## 2018-05-26 ENCOUNTER — Telehealth: Payer: Self-pay

## 2018-05-26 NOTE — Telephone Encounter (Signed)
Copied from [place] [id number]. Topic: Quick Communication - See Telephone Encounter >> May 26, 2018  1:24 PM Blase Mess A wrote: CRM for notification. See Telephone encounter for: 05/26/18. Patient is calling because bill was code wrong. Date of service 04/23/18 CPE no Pelvis exam  [facility] stated it was coded as Office visit should have been coded as an office visit.  Please advise [phone]

## 2018-05-26 NOTE — Telephone Encounter (Signed)
Office visit has been rebilled as CPE

## 2018-05-26 NOTE — Assessment & Plan Note (Signed)
Annual comprehensive preventive exam was done as well as an evaluation and management of chronic conditions .  During the course of the visit the patient was educated and counseled about appropriate screening and preventive services including :  diabetes screening, lipid analysis with projected  10 year  risk for CAD , nutrition counseling, breast, cervical and colorectal cancer screening, and recommended immunizations.  Printed recommendations for health maintenance screenings was given

## 2018-05-26 NOTE — Addendum Note (Signed)
Addended by: Crecencio Mc on: 05/26/2018 05:30 PM   Modules accepted: Level of Service

## 2018-05-27 NOTE — Telephone Encounter (Signed)
Spoke with pt to let her know that the office visit has been recoded as a CPE. Pt gave a verbal understanding.

## 2018-05-30 ENCOUNTER — Encounter: Payer: Self-pay | Admitting: Dietician

## 2018-05-30 ENCOUNTER — Encounter: Payer: 59 | Attending: General Surgery | Admitting: Dietician

## 2018-05-30 VITALS — Ht 67.0 in | Wt 203.1 lb

## 2018-05-30 DIAGNOSIS — Z713 Dietary counseling and surveillance: Secondary | ICD-10-CM | POA: Diagnosis not present

## 2018-05-30 DIAGNOSIS — Z4651 Encounter for fitting and adjustment of gastric lap band: Secondary | ICD-10-CM | POA: Insufficient documentation

## 2018-05-30 DIAGNOSIS — Z6831 Body mass index (BMI) 31.0-31.9, adult: Secondary | ICD-10-CM | POA: Insufficient documentation

## 2018-05-30 DIAGNOSIS — E6609 Other obesity due to excess calories: Secondary | ICD-10-CM

## 2018-05-30 DIAGNOSIS — E669 Obesity, unspecified: Secondary | ICD-10-CM | POA: Diagnosis not present

## 2018-05-30 NOTE — Patient Instructions (Addendum)
   Protein sources: chicken, pork, [place], fish, seafood, dairy, beans, edamame, hummus, quinoa, nuts and nut butter, soy proteins like tofu or soy milk. Eat a protein source at every meal/ snack  Sources of complex carbohydrate: grains like quinoa, brown rice, sweet potato/ baked potato, whole grain bread/ wrap (high fiber), oatmeal  Daily protein goal: 60g minimum  Healthy fats: avocado, olive oil, nuts and seeds, salmon, tuna, trout, eggs  Each meal: a protein, a carbohydrate source, a veggie source, a protein source and a small amount of fat  Healthy snack options: popcorn + nuts/ dried fruit, snack boxes (fruit, whole grain crackers, cheese, nuts, hummus, peanut butter, etc.), pretzels and hummus, fruit and nuts, dark chocolate and dried fruit/ whole fruit, whole food based bars like Kind Bar, fruit, deli chicken/turkey and cheese roll ups, veggies and low fat dip, graham crackers and peanut butter, Protein shake  Low sugar options: 10g or less per serving on a food label

## 2018-05-30 NOTE — Progress Notes (Signed)
Medical Nutrition Therapy: Visit start time: 1330  end time: 5465 Assessment:  Diagnosis: Encounter for fitting and adjustment of gastric lap band, obesity Past medical history: Vitamin B12 deficiency, obesity, see chart Psychosocial issues/ stress concerns: none reported  Preferred learning method:  . Auditory  Current weight: 203.1lb  Height: 5' 7"  Medications, supplements: MVI (non-bariatric), Vitamin B12, see chart  Progress and evaluation: Pt s/p gastric lap band adjustment 1 month ago. Initial lap band placement in 2009 which she states was "highly restrictive" vs. Now having "no restriction." Wt prior to initial surgery 212#, lowest BW 160# / size 4, where she was able to maintain for 5-6 years. She denies any formal nutrition education prior to surgery and after surgery she reports difficulty ingesting enough protein. 2013 she started to gain some wt back (160#) but continued to be active, mostly running. In 2016 she became pregnant at 180#, gave birth, and was able to lose wt back to 180#. This is her current goal wt. For the past year- 1.5 years she has been around 195-197#. 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 Finds herself snacking at her desk often. She does not eat red meat and tries to prepare meals for the week in advance on the weekends. Her family does not like a wide variety of vegetables so she tries to use her lunch salads as a way to eat more vegetables. She has been in and out of [employer] program for the past couple of years with little success.   Physical activity: walking for 103mn 2-3 days / week   Dietary Intake:  Usual eating pattern includes 2-3 meals and 1-2 snacks per day. Dining out frequency: 3-4 meals per week.  Breakfast: week days: "extreme wellness" brand high fiber wrap with 1 egg + laughing cow cheese wedge + 1-2 tsp black beans & ground chicken, weekends: "picks" at daughters breakfast, slice of toast or an egg, bacon  1-2x/month, may skip Snack: not usually Lunch: salad homemade or from [employer] (greens, blue cheese, strawberries, pecans with 2-3 spoonfuls light balsamic, sometimes adds grilled chicken) Snack: veggie chips, popcorn, pretzels Supper: chicken tortilla soup from [employer] 2x/wk, home: brown or jasmine rice + chicken/ t[place]+ sometimes green beans/ bsprouts, edamame, pasta dish with chicken 1x/wk, taco salad 1x/wk with ground chicken and black beans Snack: 2-3 oreos with milk, chex mix Beverages: coffee with SF creamer, water, 1-2 craft beer / week  Nutrition Care Education: Topics covered: sources of protein, complex vs. Refined/simple carbohydrates, sources of healthy fats, ways to increase satiety and fullness at meal times, snack ideas, meal timing to increase satiety and prevent cravings later in the day, daily protein goal, artificial sweeteners, portion sizes and portion control Basic nutrition: basic food groups, appropriate nutrient balance, appropriate meal and snack schedule, general nutrition guidelines    Weight control: determining reasonable weight goal, behavioral changes for weight loss Advanced nutrition: recipe modification, cooking techniques, food label reading Other lifestyle changes:  benefits of making changes, increasing motivation, readiness for change, identifying habits that need to change  Nutritional Diagnosis:  [place]-3.3 Overweight/obesity As related to non-restriction / expansion of lap band, lifestyle habits.  As evidenced by BMI 31.81.  Intervention: Discussion as noted above. Pt will work on being more consistent with protein intake at each meal / most snacks. She will try new snack ideas as discussed and also work on creating balanced meals that contain  sources of protein, fiber and healthy fats to increase satiety. She is willing to reincorporate some sources of complex carbohydrates rather than eliminating all starches from her diet.  Education  Materials given:  Marland Kitchen Bariatric surgery diet guidelines for lap band . Plate Planner, bariatric specific . Quick and Healthy Meals . Goals/ instructions  Learner/ who was taught:  . Patient   Level of understanding: Marland Kitchen Verbalizes/ demonstrates competency  Demonstrated degree of understanding via:   Teach back Learning barriers: . None  Willingness to learn/ readiness for change: . Acceptance, ready for change  Monitoring and Evaluation:  Dietary intake, exercise, and body weight      follow up: prn

## 2018-07-25 DIAGNOSIS — L400 Psoriasis vulgaris: Secondary | ICD-10-CM | POA: Diagnosis not present

## 2018-08-08 DIAGNOSIS — H5231 Anisometropia: Secondary | ICD-10-CM | POA: Diagnosis not present

## 2018-10-23 NOTE — Telephone Encounter (Signed)
Scheduled with margaret for tomorrow.

## 2018-10-24 ENCOUNTER — Encounter: Payer: Self-pay | Admitting: Family

## 2018-10-24 ENCOUNTER — Ambulatory Visit (INDEPENDENT_AMBULATORY_CARE_PROVIDER_SITE_OTHER): Payer: 59 | Admitting: Family

## 2018-10-24 ENCOUNTER — Other Ambulatory Visit: Payer: Self-pay

## 2018-10-24 DIAGNOSIS — R600 Localized edema: Secondary | ICD-10-CM | POA: Diagnosis not present

## 2018-10-24 MED ORDER — HYDROCHLOROTHIAZIDE 25 MG PO TABS: 12.5000 mg | ORAL_TABLET | Freq: Every day | ORAL | 3 refills | Status: DC

## 2018-10-24 NOTE — Patient Instructions (Addendum)
Start hctz 12.50m ; may take second dose that day if needed.  If you need to take diurectic more than 3 days, please call Monday 10/27/18 and schedule lab ( BMP) .  Monitor for leg redness, increased swelling, calf pain, shortness of breath, changes in calf size or otherwise any concerns. Please go to ED if you have any of the above symptoms over the weekend.   Ultrasound of leg to be scheduled.   Let me know how you are doing.

## 2018-10-24 NOTE — Progress Notes (Signed)
This visit type was conducted due to national recommendations for restrictions regarding the COVID-19 pandemic (e.g. social distancing).  This format is felt to be most appropriate for this patient at this time.  All issues noted in this document were discussed and addressed.  No physical exam was performed (except for noted visual exam findings with Video Visits). Virtual Visit via Video Note  I connected with@  on 10/24/18 at 10:30 AM EDT by a video enabled telemedicine application and verified that I am speaking with the correct person using two identifiers.  Location patient: home Location provider:work  Persons participating in the virtual visit: patient, provider  I discussed the limitations of evaluation and management by telemedicine and the availability of in person appointments. The patient expressed understanding and agreed to proceed.   HPI:  CC: right 'pitting' edema x 2 weeks, waxing and waning. Gradual onset swelling.  Initially both ankles, and now only right ankle. No injury. Pitting on medial side.  Worse after sitting long periods or standing long periods.  No recent surgeries, h/o dvt. No ocp. Non smoker. No h/o cancer.   Has been working from home 7 weeks. Notes starting new walking program 3 weeks ago.   Working from home and thinks standing on hard floor contributory. Similar episode years ago. Had been on hctz 84m ( used 2 doses with resolve) in the past.   Walking 2 miles 3-4 times per week.   No warmth to extremity, fever, SOB, CP.     ROS: See pertinent positives and negatives per HPI.  Past Medical History:  Diagnosis Date  . Anemia    with pregnancy  . Asthma    as a child  . Chicken pox   . Complication of anesthesia   . PONV (postoperative nausea and vomiting)     Past Surgical History:  Procedure Laterality Date  . breast augmentation  2003  . HERNIA REPAIR    . LAPAROSCOPIC GASTRIC BANDING  2008  . WISDOM TOOTH EXTRACTION       Family History  Problem Relation Age of Onset  . Diabetes Mother   . Cancer Mother 67      endometrial CA  . Diabetes Maternal Aunt   . Diabetes Maternal Grandmother   . Cancer Brother 21       testicular ca  . Diabetes Brother     SOCIAL HX: never smoker   Current Outpatient Medications:  .Marland Kitchen Multiple Vitamin (MULTIVITAMIN) tablet, Take 2 tablets by mouth daily., Disp: , Rfl:  .  cyanocobalamin (,VITAMIN B-12,) 1000 MCG/ML injection, Inject 1 mL (1,000 mcg total) into the muscle every 30 (thirty) days. Following 3 weekly injections (Patient not taking: Reported on 10/24/2018), Disp: 10 mL, Rfl: 0 .  hydrochlorothiazide (HYDRODIURIL) 25 MG tablet, Take 0.5 tablets (12.5 mg total) by mouth daily., Disp: 30 tablet, Rfl: 3  EXAM:  VITALS per patient if applicable:  GENERAL: alert, oriented, appears well and in no acute distress  HEENT: atraumatic, conjunttiva clear, no obvious abnormalities on inspection of external nose and ears  NECK: normal movements of the head and neck  LUNGS: on inspection no signs of respiratory distress, breathing rate appears normal, no obvious gross SOB, gasping or wheezing  CV: no obvious cyanosis  MS: moves all visible extremities without noticeable abnormality. Right medial pitting edema seen medial malleolus.  Skin color is consistent, no obvious erythema, ecchymosis.  Skin appears intact  PSYCH/NEURO: pleasant and cooperative, no obvious depression or anxiety, speech  and thought processing grossly intact  ASSESSMENT AND PLAN:  Discussed the following assessment and plan:  Leg edema - Plan: US Venous Img Lower Bilateral, Basic metabolic panel, hydrochlorothiazide (HYDRODIURIL) 25 MG tablet  Problem List Items Addressed This Visit      Other   Leg edema - Primary    Right calf - 18'' inches Left Calf - 18'' inches. Wells criteria low risk for DVT. Advised patient to have stat ultrasound of the legs today, particularly since swelling is  unilateral.  She but declines and would like to follow swelling over the weekend after she starts HCTZ.  She will schedule ultrasound next week.  She understands the  risk of missing a DVT which can be lethal.  If she takes HCTZ longer than 3 days, advised her to come back for labs on Monday.  Patient will stay vigilant over the weekend and let us know how she is doing       Relevant Medications   hydrochlorothiazide (HYDRODIURIL) 25 MG tablet   Other Relevant Orders   US Venous Img Lower Bilateral   Basic metabolic panel       I discussed the assessment and treatment plan with the patient. The patient was provided an opportunity to ask questions and all were answered. The patient agreed with the plan and demonstrated an understanding of the instructions.   The patient was advised to call back or seek an in-person evaluation if the symptoms worsen or if the condition fails to improve as anticipated.   Mable Paris, FNP

## 2018-10-24 NOTE — Assessment & Plan Note (Addendum)
Right calf - 18'' inches Left Calf - 18'' inches. Wells criteria low risk for DVT. Advised patient to have stat ultrasound of the legs today, particularly since swelling is unilateral.  She but declines and would like to follow swelling over the weekend after she starts HCTZ.  She will schedule ultrasound next week.  She understands the  risk of missing a DVT which can be lethal.  If she takes HCTZ longer than 3 days, advised her to come back for labs on Monday.  Patient will stay vigilant over the weekend and let us know how she is doing

## 2018-10-28 ENCOUNTER — Other Ambulatory Visit: Payer: 59

## 2018-10-28 ENCOUNTER — Ambulatory Visit: Payer: 59

## 2018-11-05 ENCOUNTER — Ambulatory Visit: Payer: 59

## 2018-12-24 DIAGNOSIS — R600 Localized edema: Secondary | ICD-10-CM

## 2018-12-24 MED ORDER — HYDROCHLOROTHIAZIDE 25 MG PO TABS: 12.5000 mg | ORAL_TABLET | Freq: Every day | ORAL | 0 refills | Status: DC

## 2019-01-05 DIAGNOSIS — Z01419 Encounter for gynecological examination (general) (routine) without abnormal findings: Secondary | ICD-10-CM | POA: Diagnosis not present

## 2019-01-05 DIAGNOSIS — Z6833 Body mass index (BMI) 33.0-33.9, adult: Secondary | ICD-10-CM | POA: Diagnosis not present

## 2019-02-16 ENCOUNTER — Other Ambulatory Visit: Payer: Self-pay | Admitting: Internal Medicine

## 2019-02-16 DIAGNOSIS — R600 Localized edema: Secondary | ICD-10-CM

## 2019-03-09 DIAGNOSIS — Z1231 Encounter for screening mammogram for malignant neoplasm of breast: Secondary | ICD-10-CM | POA: Diagnosis not present

## 2019-03-10 ENCOUNTER — Telehealth: Payer: Self-pay | Admitting: Internal Medicine

## 2019-03-10 DIAGNOSIS — Z0000 Encounter for general adult medical examination without abnormal findings: Secondary | ICD-10-CM

## 2019-03-10 DIAGNOSIS — E538 Deficiency of other specified B group vitamins: Secondary | ICD-10-CM

## 2019-03-10 NOTE — Telephone Encounter (Signed)
Pt is having a cpe on 04/24/2019, I scheduled fasting labs for 04/22/2019, Please put lab orders in and patient ask to make sure that her B12 level is checked also.

## 2019-03-11 DIAGNOSIS — L400 Psoriasis vulgaris: Secondary | ICD-10-CM | POA: Diagnosis not present

## 2019-03-11 DIAGNOSIS — D2262 Melanocytic nevi of left upper limb, including shoulder: Secondary | ICD-10-CM | POA: Diagnosis not present

## 2019-03-11 DIAGNOSIS — D485 Neoplasm of uncertain behavior of skin: Secondary | ICD-10-CM | POA: Diagnosis not present

## 2019-03-11 DIAGNOSIS — D2271 Melanocytic nevi of right lower limb, including hip: Secondary | ICD-10-CM | POA: Diagnosis not present

## 2019-03-11 DIAGNOSIS — D2261 Melanocytic nevi of right upper limb, including shoulder: Secondary | ICD-10-CM | POA: Diagnosis not present

## 2019-03-11 DIAGNOSIS — D2272 Melanocytic nevi of left lower limb, including hip: Secondary | ICD-10-CM | POA: Diagnosis not present

## 2019-03-11 DIAGNOSIS — D225 Melanocytic nevi of trunk: Secondary | ICD-10-CM | POA: Diagnosis not present

## 2019-03-11 DIAGNOSIS — L821 Other seborrheic keratosis: Secondary | ICD-10-CM | POA: Diagnosis not present

## 2019-03-11 DIAGNOSIS — R208 Other disturbances of skin sensation: Secondary | ICD-10-CM | POA: Diagnosis not present

## 2019-03-17 ENCOUNTER — Encounter: Payer: Self-pay | Admitting: Emergency Medicine

## 2019-03-17 ENCOUNTER — Other Ambulatory Visit: Payer: Self-pay

## 2019-03-17 ENCOUNTER — Ambulatory Visit
Admission: EM | Admit: 2019-03-17 | Discharge: 2019-03-17 | Disposition: A | Discharge status: Home / Self Care | Payer: 59 | Attending: Emergency Medicine | Admitting: Emergency Medicine

## 2019-03-17 DIAGNOSIS — K122 Cellulitis and abscess of mouth: Secondary | ICD-10-CM | POA: Diagnosis not present

## 2019-03-17 LAB — RAPID STREP SCREEN (MED CTR MEBANE ONLY): Streptococcus, Group A Screen (Direct): NEGATIVE

## 2019-03-17 MED ORDER — AMOXICILLIN 875 MG PO TABS: 875.0000 mg | ORAL_TABLET | Freq: Two times a day (BID) | ORAL | 0 refills | Status: DC

## 2019-03-17 NOTE — ED Provider Notes (Signed)
[facility] ____________________________________________  Time seen: Approximately 7:54 PM  I have reviewed the triage vital signs and the nursing notes.   HISTORY  Chief Complaint Sore Throat  HPI Alicia Blair is a 43 y.o. female presenting for evaluation of swelling to her uvula for the last 4 days.  States the actual throat is not sore.  States she is frequently clearing her throat as she feels the uvula is tickling the back of her throat.  Denies any difficulty swallowing or clearing her airway.  Denies any other accompanying symptoms.  Denies chest pain, shortness of breath, fevers, cough, congestion, changes in taste or smell or GI complaints.  Denies known sick contacts.  Does states she snores a lot but denies any known sleep apnea.  Has continued to eat and drink well.  Denies aggravating or alleviating factors otherwise.  Crecencio Mc, MD: PCP Patient's last menstrual period was 02/24/2019.   Past Medical History:  Diagnosis Date  . Anemia    with pregnancy  . Asthma    as a child  . Chicken pox   . Complication of anesthesia   . PONV (postoperative nausea and vomiting)     Patient Active Problem List   Diagnosis Date Noted  . Leg edema 10/24/2018  . B12 deficiency 04/07/2017  . PIH (pregnancy induced hypertension) 05/14/2016  . SVD (spontaneous vaginal delivery) 05/14/2016  . Screening for cervical cancer 02/12/2015  . Needlestick injury accident with exposure to body fluid 01/18/2014  . Menorrhagia 01/18/2014  . Visit for preventive health examination 01/18/2014  . History of motion sickness 07/12/2013  . Screening for lipoid disorders 06/01/2012  . Screening for diabetes mellitus 06/01/2012  . Skin lesion of face 06/01/2012  . Obesity due to excess calories without serious comorbidity 06/01/2012  . Psoriasis 05/27/2012    Past Surgical History:  Procedure Laterality Date  . breast augmentation  2003  . HERNIA REPAIR    .  LAPAROSCOPIC GASTRIC BANDING  2008  . WISDOM TOOTH EXTRACTION       No current facility-administered medications for this encounter.   Current Outpatient Medications:  .  cyanocobalamin (,VITAMIN B-12,) 1000 MCG/ML injection, Inject 1 mL (1,000 mcg total) into the muscle every 30 (thirty) days. Following 3 weekly injections, Disp: 10 mL, Rfl: 0 .  Multiple Vitamin (MULTIVITAMIN) tablet, Take 2 tablets by mouth daily., Disp: , Rfl:  .  amoxicillin (AMOXIL) 875 MG tablet, Take 1 tablet (875 mg total) by mouth 2 (two) times daily., Disp: 20 tablet, Rfl: 0  Allergies Codeine and Erythromycin  Family History  Problem Relation Age of Onset  . Diabetes Mother   . Cancer Mother 64       endometrial CA  . Diabetes Maternal Aunt   . Diabetes Maternal Grandmother   . Cancer Brother 21       testicular ca  . Diabetes Brother     Social History Social History   Tobacco Use  . Smoking status: Never Smoker  . Smokeless tobacco: Never Used  Substance Use Topics  . Alcohol use: Yes    Alcohol/week: 3.0 standard drinks    Types: 3 Cans of beer per week    Comment: social, none since pregnanc y  . Drug use: No    Review of Systems Constitutional: No fever.  ENT: No sore throat. As above.  Cardiovascular: Denies chest pain. Respiratory: Denies shortness of breath. Gastrointestinal: No abdominal pain.  No nausea, no vomiting.  No diarrhea. Skin:  Negative for rash.   ____________________________________________   PHYSICAL EXAM:  VITAL SIGNS: ED Triage Vitals  Enc Vitals Group     BP 03/17/19 1907 (!) 142/92     Pulse Rate 03/17/19 1907 83     Resp 03/17/19 1907 18     Temp 03/17/19 1907 98.7 F (37.1 C)     Temp Source 03/17/19 1907 Oral     SpO2 03/17/19 1907 98 %     Weight 03/17/19 1904 210 lb (95.3 kg)     Height 03/17/19 1904 5' 7"  (1.702 m)     Head Circumference --      Peak Flow --      Pain Score 03/17/19 1904 0     Pain Loc --      Pain Edu? --      Excl.  in [place]? --     Constitutional: Alert and oriented. Well appearing and in no acute distress. Eyes: Conjunctivae are normal.  Head: Atraumatic. No swelling. No erythema.  Nose:No nasal congestion  Mouth/Throat: Mucous membranes are moist. Mild uvular erythema and edema localized.  No other edema noted.  No exudate.  No uvular shift or deviation. Neck: No stridor.  No cervical spine tenderness to palpation. Hematological/Lymphatic/Immunilogical: Mild anterior bilateral cervical lymphadenopathy. Cardiovascular: Normal rate, regular rhythm. Grossly normal heart sounds.  Good peripheral circulation. Respiratory: Normal respiratory effort.  No retractions. No wheezes, rales or rhonchi. Good air movement.  Musculoskeletal: Ambulatory with steady gait.  Neurologic:  Normal speech and language. No gait instability. Skin:  Skin appears warm, dry and intact. No rash noted. Psychiatric: Mood and affect are normal. Speech and behavior are normal.  ___________________________________________   LABS (all labs ordered are listed, but only abnormal results are displayed)  Labs Reviewed  RAPID STREP SCREEN ([facility] ONLY)  NOVEL CORONAVIRUS, NAA (HOSP ORDER, SEND-OUT TO REF LAB; TAT 18-24 HRS)  CULTURE, GROUP A STREP [facility])    PROCEDURES Procedures   INITIAL IMPRESSION / ASSESSMENT AND PLAN / ED COURSE  Pertinent labs & imaging results that were available during my care of the patient were reviewed by me and considered in my medical decision making (see chart for details).  Well-appearing patient.  No acute distress.  No difficulty swallowing, shortness of breath or clearing airway.  Strep negative, will culture.  COVID-19 testing also completed and advice given.  Patient with mild uvulitis.  Will conservatively treat with oral amoxicillin.  Recommend over-the-counter ibuprofen, saltwater gargles, supportive care monitoring. Discussed indication, risks and benefits of medications with patient.   Discussed follow up with Primary care physician this week as needed. Discussed follow up and return parameters including no resolution or any worsening concerns. Patient verbalized understanding and agreed to plan.   ____________________________________________   FINAL CLINICAL IMPRESSION(S) / ED DIAGNOSES  Final diagnoses:  Uvulitis     ED Discharge Orders         Ordered    amoxicillin (AMOXIL) 875 MG tablet  2 times daily     03/17/19 1948           Note: This dictation was prepared with Dragon dictation along with smaller phrase technology. Any transcriptional errors that result from this process are unintentional.         Marylene Land, NP 03/17/19 2013

## 2019-03-17 NOTE — ED Triage Notes (Signed)
Patient c/o swollen uvula that started 4 days ago. She denies a sore throat. Denies fever, denies cough.

## 2019-03-17 NOTE — Discharge Instructions (Signed)
Take medication as prescribed.  Over-the-counter ibuprofen.  Monitor.  Rest. Drink plenty of fluids.   Follow up with your primary care physician this week as needed. Return to Urgent care for new or worsening concerns.

## 2019-03-19 LAB — NOVEL CORONAVIRUS, NAA (HOSP ORDER, SEND-OUT TO REF LAB; TAT 18-24 HRS): SARS-CoV-2, NAA: NOT DETECTED

## 2019-03-20 LAB — CULTURE, GROUP A STREP (THRC)

## 2019-03-26 NOTE — Telephone Encounter (Signed)
Pt is scheduled to have her lab done prior to her physical in November. I have ordered B12, CBC, CMP, A1c, Lipid, TSH. Is there anything else that needs to be ordered?

## 2019-04-20 ENCOUNTER — Other Ambulatory Visit: Payer: Self-pay

## 2019-04-21 DIAGNOSIS — R070 Pain in throat: Secondary | ICD-10-CM | POA: Diagnosis not present

## 2019-04-21 DIAGNOSIS — J309 Allergic rhinitis, unspecified: Secondary | ICD-10-CM | POA: Diagnosis not present

## 2019-04-22 ENCOUNTER — Other Ambulatory Visit (INDEPENDENT_AMBULATORY_CARE_PROVIDER_SITE_OTHER): Payer: 59

## 2019-04-22 ENCOUNTER — Other Ambulatory Visit: Payer: Self-pay

## 2019-04-22 DIAGNOSIS — Z0000 Encounter for general adult medical examination without abnormal findings: Secondary | ICD-10-CM | POA: Diagnosis not present

## 2019-04-22 DIAGNOSIS — E538 Deficiency of other specified B group vitamins: Secondary | ICD-10-CM | POA: Diagnosis not present

## 2019-04-22 LAB — CBC WITH DIFFERENTIAL/PLATELET
Basophils Absolute: 0.1 10*3/uL (ref 0.0–0.1)
Basophils Relative: 1 % (ref 0.0–3.0)
Eosinophils Absolute: 0.3 10*3/uL (ref 0.0–0.7)
Eosinophils Relative: 5.3 % — ABNORMAL HIGH (ref 0.0–5.0)
HCT: 37.4 % (ref 36.0–46.0)
Hemoglobin: 12.2 g/dL (ref 12.0–15.0)
Lymphocytes Relative: 31.5 % (ref 12.0–46.0)
Lymphs Abs: 2 10*3/uL (ref 0.7–4.0)
MCHC: 32.6 g/dL (ref 30.0–36.0)
MCV: 85.9 fl (ref 78.0–100.0)
Monocytes Absolute: 0.5 10*3/uL (ref 0.1–1.0)
Monocytes Relative: 7.6 % (ref 3.0–12.0)
Neutro Abs: 3.4 10*3/uL (ref 1.4–7.7)
Neutrophils Relative %: 54.6 % (ref 43.0–77.0)
Platelets: 288 10*3/uL (ref 150.0–400.0)
RBC: 4.35 Mil/uL (ref 3.87–5.11)
RDW: 14.6 % (ref 11.5–15.5)
WBC: 6.2 10*3/uL (ref 4.0–10.5)

## 2019-04-22 LAB — COMPREHENSIVE METABOLIC PANEL
ALT: 9 U/L (ref 0–35)
AST: 13 U/L (ref 0–37)
Albumin: 4.3 g/dL (ref 3.5–5.2)
Alkaline Phosphatase: 46 U/L (ref 39–117)
BUN: 7 mg/dL (ref 6–23)
CO2: 26 mEq/L (ref 19–32)
Calcium: 9 mg/dL (ref 8.4–10.5)
Chloride: 106 mEq/L (ref 96–112)
Creatinine, Ser: 0.63 mg/dL (ref 0.40–1.20)
GFR: 102.99 mL/min (ref >60.00)
Glucose, Bld: 101 mg/dL — ABNORMAL HIGH (ref 70–99)
Potassium: 4 mEq/L (ref 3.5–5.1)
Sodium: 139 mEq/L (ref 135–145)
Total Bilirubin: 0.3 mg/dL (ref 0.2–1.2)
Total Protein: 7 g/dL (ref 6.0–8.3)

## 2019-04-22 LAB — LIPID PANEL
Cholesterol: 198 mg/dL (ref 0–200)
HDL: 57.6 mg/dL (ref >39.00)
LDL Cholesterol: 124 mg/dL — ABNORMAL HIGH (ref 0–99)
NonHDL: 140.85
Total CHOL/HDL Ratio: 3
Triglycerides: 86 mg/dL (ref 0.0–149.0)
VLDL: 17.2 mg/dL (ref 0.0–40.0)

## 2019-04-22 LAB — HEMOGLOBIN A1C: Hgb A1c MFr Bld: 5.7 % (ref 4.6–6.5)

## 2019-04-22 LAB — TSH: TSH: 3.83 u[IU]/mL (ref 0.35–4.50)

## 2019-04-22 LAB — VITAMIN B12: Vitamin B-12: 301 pg/mL (ref 211–911)

## 2019-04-24 ENCOUNTER — Other Ambulatory Visit: Payer: Self-pay

## 2019-04-24 ENCOUNTER — Ambulatory Visit (INDEPENDENT_AMBULATORY_CARE_PROVIDER_SITE_OTHER): Payer: 59 | Admitting: Internal Medicine

## 2019-04-24 ENCOUNTER — Encounter: Payer: Self-pay | Admitting: Internal Medicine

## 2019-04-24 DIAGNOSIS — R600 Localized edema: Secondary | ICD-10-CM | POA: Diagnosis not present

## 2019-04-24 DIAGNOSIS — E538 Deficiency of other specified B group vitamins: Secondary | ICD-10-CM

## 2019-04-24 DIAGNOSIS — Z0000 Encounter for general adult medical examination without abnormal findings: Secondary | ICD-10-CM

## 2019-04-24 MED ORDER — CYANOCOBALAMIN 1000 MCG/ML IJ SOLN: 1000.0000 ug | INTRAMUSCULAR | 0 refills | Status: DC

## 2019-04-24 MED ORDER — "SYRINGE 25G X 1"" 3 ML MISC": 0 refills | Status: DC

## 2019-04-24 NOTE — Assessment & Plan Note (Signed)
ADVISED TO RESUME MONTHLY INJECTIONS>  RX SENT TO [facility]

## 2019-04-24 NOTE — Progress Notes (Signed)
Patient ID: CORINTHIA HELMERS, female    DOB: 08-Feb-1976  Age: 43 y.o. MRN: [id number]  The patient is here for annual PREVENTIVE examination and management of other chronic and acute problems.   The risk factors are reflected in the social history.  The roster of all physicians providing medical care to patient - is listed in the Snapshot section of the chart.  Activities of daily living:  The patient is 100% independent in all ADLs: dressing, toileting, feeding as well as independent mobility  Home safety : The patient has smoke detectors in the home. They wear seatbelts.  There are no firearms at home. There is no violence in the home.   There is no risks for hepatitis, STDs or HIV. There is no   history of blood transfusion. They have no travel history to infectious disease endemic areas of the world.  The patient has seen their dentist in the last six month. They have seen their eye doctor in the last year. They admit to slight hearing difficulty with regard to whispered voices and some television programs.  They have deferred audiologic testing in the last year.  They do not  have excessive sun exposure. Discussed the need for sun protection: hats, long sleeves and use of sunscreen if there is significant sun exposure.   Diet: the importance of a healthy diet is discussed. They do have a healthy diet.  The benefits of regular aerobic exercise were discussed. She walks 4 times per week ,  20 minutes.   Depression screen: there are no signs or vegative symptoms of depression- irritability, change in appetite, anhedonia, sadness/tearfullness.  Cognitive assessment: the patient manages all their financial and personal affairs and is actively engaged. They could relate day,date,year and events; recalled 2/3 objects at 3 minutes; performed clock-face test normally.  The following portions of the patient's history were reviewed and updated as appropriate: allergies, current medications, past  family history, past medical history,  past surgical history, past social history  and problem list.  Visual acuity was not assessed per patient preference since she has regular follow up with her ophthalmologist. Hearing and body mass index were assessed and reviewed.   During the course of the visit the patient was educated and counseled about appropriate screening and preventive services including : fall prevention , diabetes screening, nutrition counseling, colorectal cancer screening, and recommended immunizations.    CC: Diagnoses of Visit for preventive health examination, B12 deficiency, and Leg edema were pertinent to this visit.  History Naomie has a past medical history of Anemia, Asthma, Chicken pox, Complication of anesthesia, and PONV (postoperative nausea and vomiting).   She has a past surgical history that includes Laparoscopic gastric banding (2008); breast augmentation (2003); Hernia repair; and Wisdom tooth extraction.   Her family history includes Cancer (age of onset: 84) in her brother; Cancer (age of onset: 5) in her mother; Diabetes in her brother, maternal aunt, maternal grandmother, and mother.She reports that she has never smoked. She has never used smokeless tobacco. She reports current alcohol use of about 3.0 standard drinks of alcohol per week. She reports that she does not use drugs.  Outpatient Medications Prior to Visit  Medication Sig Dispense Refill  . clobetasol cream (TEMOVATE) 7.78 % Apply 1 application topically as needed.    . Multiple Vitamin (MULTIVITAMIN) tablet Take 2 tablets by mouth daily.    . cyanocobalamin (,VITAMIN B-12,) 1000 MCG/ML injection Inject 1 mL (1,000 mcg total) into the muscle every 30 (  thirty) days. Following 3 weekly injections 10 mL 0  . amoxicillin (AMOXIL) 875 MG tablet Take 1 tablet (875 mg total) by mouth 2 (two) times daily. (Patient not taking: Reported on 04/24/2019) 20 tablet 0   No facility-administered medications prior  to visit.     Review of Systems   Patient denies headache, fevers, malaise, unintentional weight loss, skin rash, eye pain, sinus congestion and sinus pain, sore throat, dysphagia,  hemoptysis , cough, dyspnea, wheezing, chest pain, palpitations, orthopnea, edema, abdominal pain, nausea, melena, diarrhea, constipation, flank pain, dysuria, hematuria, urinary  Frequency, nocturia, numbness, tingling, seizures,  Focal weakness, Loss of consciousness,  Tremor, insomnia, depression, anxiety, and suicidal ideation.      Objective:  BP 138/90 (BP Location: Left Arm, Patient Position: Sitting, Cuff Size: Normal)   Pulse 84   Temp 98.2 F (36.8 C) (Temporal)   Ht 5' 6.3" (1.684 m)   Wt 208 lb (94.3 kg)   SpO2 96%   BMI 33.27 kg/m   Physical Exam   General appearance: alert, cooperative and appears stated age Ears: normal TM's and external ear canals both ears Throat: lips, mucosa, and tongue normal; teeth and gums normal Neck: no adenopathy, no carotid bruit, supple, symmetrical, trachea midline and thyroid not enlarged, symmetric, no tenderness/mass/nodules Back: symmetric, no curvature. ROM normal. No CVA tenderness. Lungs: clear to auscultation bilaterally Heart: regular rate and rhythm, S1, S2 normal, no murmur, click, rub or gallop Abdomen: soft, non-tender; bowel sounds normal; no masses,  no organomegaly Pulses: 2+ and symmetric Skin: Skin color, texture, turgor normal. No rashes or lesions Lymph nodes: Cervical, supraclavicular, and axillary nodes normal.    Assessment & Plan:   Problem List Items Addressed This Visit      Unprioritized   Visit for preventive health examination    age appropriate education and counseling updated, referrals for preventative services and immunizations addressed, dietary and smoking counseling addressed, most recent labs reviewed.  I have personally reviewed and have noted:  1) the patient's medical and social history 2) The pt's use of  alcohol, tobacco, and illicit drugs 3) The patient's current medications and supplements 4) Functional ability including ADL's, fall risk, home safety risk, hearing and visual impairment 5) Diet and physical activities 6) Evidence for depression or mood disorder 7) The patient's height, weight, and BMI have been recorded in the chart  I have made referrals, and provided counseling and education based on review of the above      B12 deficiency    ADVISED TO RESUME MONTHLY INJECTIONS>  [facility] SENT [facility]       Leg edema    EPISODIC ,  RESOLVED.  SECONDARY TO PROLONGED SITTING         I have changed Carita Pian. Hoos's cyanocobalamin. I am also having her start on SYRINGE 3CC/25GX1". Additionally, I am having her maintain her multivitamin, amoxicillin, and clobetasol cream.  Meds ordered this encounter  Medications  . cyanocobalamin (,VITAMIN B-12,) 1000 MCG/ML injection    Sig: Inject 1 mL (1,000 mcg total) into the muscle every 30 (thirty) days.    Dispense:  10 mL    Refill:  0  . Syringe/Needle, Disp, (SYRINGE 3CC/25GX1") 25G X 1" 3 ML MISC    Sig: Use for b12 injections    Dispense:  50 each    Refill:  0    Medications Discontinued During This Encounter  Medication Reason  . cyanocobalamin (,VITAMIN B-12,) 1000 MCG/ML injection Reorder  Follow-up: No follow-ups on file.   Crecencio Mc, MD

## 2019-04-24 NOTE — Assessment & Plan Note (Signed)
EPISODIC ,  RESOLVED.  SECONDARY TO PROLONGED SITTING

## 2019-04-24 NOTE — Patient Instructions (Signed)
Health Maintenance, Female Adopting a healthy lifestyle and getting preventive care are important in promoting health and wellness. Ask your health care provider about:  The right schedule for you to have regular tests and exams.  Things you can do on your own to prevent diseases and keep yourself healthy. What should I know about diet, weight, and exercise? Eat a healthy diet   Eat a diet that includes plenty of vegetables, fruits, low-fat dairy products, and lean protein.  Do not eat a lot of foods that are high in solid fats, added sugars, or sodium. Maintain a healthy weight Body mass index (BMI) is used to identify weight problems. It estimates body fat based on height and weight. Your health care provider can help determine your BMI and help you achieve or maintain a healthy weight. Get regular exercise Get regular exercise. This is one of the most important things you can do for your health. Most adults should:  Exercise for at least 150 minutes each week. The exercise should increase your heart rate and make you sweat (moderate-intensity exercise).  Do strengthening exercises at least twice a week. This is in addition to the moderate-intensity exercise.  Spend less time sitting. Even light physical activity can be beneficial. Watch cholesterol and blood lipids Have your blood tested for lipids and cholesterol at 43 years of age, then have this test every 5 years. Have your cholesterol levels checked more often if:  Your lipid or cholesterol levels are high.  You are older than 43 years of age.  You are at high risk for heart disease. What should I know about cancer screening? Depending on your health history and family history, you may need to have cancer screening at various ages. This may include screening for:  Breast cancer.  Cervical cancer.  Colorectal cancer.  Skin cancer.  Lung cancer. What should I know about heart disease, diabetes, and high blood  pressure? Blood pressure and heart disease  High blood pressure causes heart disease and increases the risk of stroke. This is more likely to develop in people who have high blood pressure readings, are of African descent, or are overweight.  Have your blood pressure checked: ? Every 3-5 years if you are 18-39 years of age. ? Every year if you are 40 years old or older. Diabetes Have regular diabetes screenings. This checks your fasting blood sugar level. Have the screening done:  Once every three years after age 40 if you are at a normal weight and have a low risk for diabetes.  More often and at a younger age if you are overweight or have a high risk for diabetes. What should I know about preventing infection? Hepatitis B If you have a higher risk for hepatitis B, you should be screened for this virus. Talk with your health care provider to find out if you are at risk for hepatitis B infection. Hepatitis C Testing is recommended for:  Everyone born from 1945 through 1965.  Anyone with known risk factors for hepatitis C. Sexually transmitted infections (STIs)  Get screened for STIs, including gonorrhea and chlamydia, if: ? You are sexually active and are younger than 43 years of age. ? You are older than 43 years of age and your health care provider tells you that you are at risk for this type of infection. ? Your sexual activity has changed since you were last screened, and you are at increased risk for chlamydia or gonorrhea. Ask your health care provider if   you are at risk.  Ask your health care provider about whether you are at high risk for HIV. Your health care provider may recommend a prescription medicine to help prevent HIV infection. If you choose to take medicine to prevent HIV, you should first get tested for HIV. You should then be tested every 3 months for as long as you are taking the medicine. Pregnancy  If you are about to stop having your period (premenopausal) and  you may become pregnant, seek counseling before you get pregnant.  Take 400 to 800 micrograms (mcg) of folic acid every day if you become pregnant.  Ask for birth control (contraception) if you want to prevent pregnancy. Osteoporosis and menopause Osteoporosis is a disease in which the bones lose minerals and strength with aging. This can result in bone fractures. If you are 65 years old or older, or if you are at risk for osteoporosis and fractures, ask your health care provider if you should:  Be screened for bone loss.  Take a calcium or vitamin D supplement to lower your risk of fractures.  Be given hormone replacement therapy (HRT) to treat symptoms of menopause. Follow these instructions at home: Lifestyle  Do not use any products that contain nicotine or tobacco, such as cigarettes, e-cigarettes, and chewing tobacco. If you need help quitting, ask your health care provider.  Do not use street drugs.  Do not share needles.  Ask your health care provider for help if you need support or information about quitting drugs. Alcohol use  Do not drink alcohol if: ? Your health care provider tells you not to drink. ? You are pregnant, may be pregnant, or are planning to become pregnant.  If you drink alcohol: ? Limit how much you use to 0-1 drink a day. ? Limit intake if you are breastfeeding.  Be aware of how much alcohol is in your drink. In the [place] one drink equals one 12 oz bottle of beer (355 mL), one 5 oz glass of wine (148 mL), or one 1 oz glass of hard liquor (44 mL). General instructions  Schedule regular health, dental, and eye exams.  Stay current with your vaccines.  Tell your health care provider if: ? You often feel depressed. ? You have ever been abused or do not feel safe at home. Summary  Adopting a healthy lifestyle and getting preventive care are important in promoting health and wellness.  Follow your health care provider's instructions about healthy  diet, exercising, and getting tested or screened for diseases.  Follow your health care provider's instructions on monitoring your cholesterol and blood pressure. This information is not intended to replace advice given to you by your health care provider. Make sure you discuss any questions you have with your health care provider. Document Released: 12/18/2010 Document Revised: 05/28/2018 Document Reviewed: 05/28/2018 Elsevier Patient Education  2020 [employer] Inc[employer]  

## 2019-04-24 NOTE — Assessment & Plan Note (Signed)

## 2019-07-31 ENCOUNTER — Ambulatory Visit: Payer: 59 | Attending: Internal Medicine

## 2019-07-31 ENCOUNTER — Other Ambulatory Visit: Payer: 59

## 2019-07-31 DIAGNOSIS — Z20822 Contact with and (suspected) exposure to covid-19: Secondary | ICD-10-CM

## 2019-08-01 LAB — NOVEL CORONAVIRUS, NAA: SARS-CoV-2, NAA: NOT DETECTED

## 2019-08-04 NOTE — Telephone Encounter (Signed)
Pt called and wants to know why she didn't get a call yesterday afternoon at 4:30. Please call back to clarify. Pt is still having ear pain

## 2019-08-05 NOTE — Telephone Encounter (Signed)
Pt was returning your call and wants a call back before the end of the day.

## 2019-08-05 NOTE — Telephone Encounter (Signed)
Spoke with pt to apologize to her for not seeing her message in time to have schedule her for a telephone visit at the beginning of the week. Pt stated that she is starting to feel better no more sore throat, still has a dull achy discomfort in her right ear and a slightly swollen lymph node on the right side of the neck. Pt stated that she has been looking in the back of her throat and has not seen any white spots in the back of her throat. I have scheduled pt for a doxy on Friday at 10am.

## 2019-08-05 NOTE — Telephone Encounter (Signed)
LMTCB

## 2019-08-07 ENCOUNTER — Encounter: Payer: Self-pay | Admitting: Internal Medicine

## 2019-08-07 ENCOUNTER — Ambulatory Visit (INDEPENDENT_AMBULATORY_CARE_PROVIDER_SITE_OTHER): Payer: 59 | Admitting: Internal Medicine

## 2019-08-07 DIAGNOSIS — H6691 Otitis media, unspecified, right ear: Secondary | ICD-10-CM | POA: Insufficient documentation

## 2019-08-07 DIAGNOSIS — H6501 Acute serous otitis media, right ear: Secondary | ICD-10-CM

## 2019-08-07 MED ORDER — AMOXICILLIN 500 MG PO CAPS: 500.0000 mg | ORAL_CAPSULE | Freq: Three times a day (TID) | ORAL | 0 refills | Status: DC

## 2019-08-07 NOTE — Progress Notes (Signed)
Virtual Visit CONVERTED TO TELEPHONE  This visit type was conducted due to national recommendations for restrictions regarding the COVID-19 pandemic (e.g. social distancing).  This format is felt to be most appropriate for this patient at this time.  All issues noted in this document were discussed and addressed.  No physical exam was performed (except for noted visual exam findings with Video Visits).   I attempted to connect with@ on 08/07/19 at 10:00 AM EST by a video enabled telemedicine application . Interactive  video telecommunications were  established beteen this provider and patient, however audio connection ultimately failed, due to patient having technical difficulties. We continued and completed visit with audio only  By phone and verified that I am speaking with the correct person using two identifiersLocation provider: work or home office Persons participating in the virtual visit: patient, provider  I discussed the limitations, risks, security and privacy concerns of performing an evaluation and management service by telephone and the availability of in person appointments. I also discussed with the patient that there may be a patient responsible charge related to this service. The patient expressed understanding and agreed to proceed.   Reason for visit: RIGHT EAR DISCOMFORT FOR THE PAST WEEK   HPI:  Woke up one week ago with sore throat, nausea,  Cervical lymphadenopathy on the right side.  Sore throat  subsided after 4 days ,  And the RIGHT EAR FEELS BETTER AFTER ONE WEEK BUT still has DISCOMFORT IN EAR WHEN SWALLOWING , and feels flushed frequently .    No history of sinus congestion.    Temp was 99.5 max  COVID TEST NEGATIVE. 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  No known contacts with anyone sick . Entire family schooling and working from home.    ROS: See pertinent positives and negatives per HPI.  Past Medical  History:  Diagnosis Date  . Anemia    with pregnancy  . Asthma    as a child  . Chicken pox   . Complication of anesthesia   . PONV (postoperative nausea and vomiting)     Past Surgical History:  Procedure Laterality Date  . breast augmentation  2003  . HERNIA REPAIR    . LAPAROSCOPIC GASTRIC BANDING  2008  . WISDOM TOOTH EXTRACTION      Family History  Problem Relation Age of Onset  . Diabetes Mother   . Cancer Mother 73       endometrial CA  . Diabetes Maternal Aunt   . Diabetes Maternal Grandmother   . Cancer Brother 21       testicular ca  . Diabetes Brother     SOCIAL HX:  reports that she has never smoked. She has never used smokeless tobacco. She reports current alcohol use of about 3.0 standard drinks of alcohol per week. She reports that she does not use drugs.   Current Outpatient Medications:  .  amoxicillin (AMOXIL) 500 MG capsule, Take 1 capsule (500 mg total) by mouth 3 (three) times daily., Disp: 21 capsule, Rfl: 0 .  clobetasol cream (TEMOVATE) 8.85 %, Apply 1 application topically as needed., Disp: , Rfl:  .  cyanocobalamin (,VITAMIN B-12,) 1000 MCG/ML injection, Inject 1 mL (1,000 mcg total) into the muscle every 30 (thirty) days., Disp: 10 mL, Rfl: 0 .  Multiple Vitamin (MULTIVITAMIN) tablet, Take 2 tablets by mouth daily., Disp: , Rfl:  .  Syringe/Needle, Disp, (SYRINGE 3CC/25GX1") 25G X 1" 3 ML MISC, Use for b12 injections, Disp: 50 each, Rfl: 0  EXAM:  General impression: alert, cooperative and articulate.  No signs of being in distress  Lungs: speech is fluent sentence length suggests that patient is not short of breath and not punctuated by cough, sneezing or sniffing. Marland Kitchen   Psych: affect normal.  speech is articulate and non pressured . pleasant and cooperative, no obvious depression or anxiety, speech and thought processing grossly intact  ASSESSMENT AND PLAN:  Discussed the following assessment and plan:  Non-recurrent acute serous otitis  media of right ear  Otitis media, right Given persistence of symptoms when all other symptoms have resolved, will treat  with amoxicillin 500 mg tid for one week..  Probiotic advised     I discussed the assessment and treatment plan with the patient. The patient was provided an opportunity to ask questions and all were answered. The patient agreed with the plan and demonstrated an understanding of the instructions.   The patient was advised to call back or seek an in-person evaluation if the symptoms worsen or if the condition fails to improve as anticipated.  I provided  25=1 minutes of non-face-to-face time during this encounter reviewing patient's current problems, providing counseling on the above mentioned problems , and coordination  of care .   Crecencio Mc, MD

## 2019-08-07 NOTE — Assessment & Plan Note (Addendum)
Given persistence of symptoms when all other symptoms have resolved, will treat  with amoxicillin 500 mg tid for one week..  Probiotic advised

## 2019-09-07 DIAGNOSIS — R87619 Unspecified abnormal cytological findings in specimens from cervix uteri: Secondary | ICD-10-CM | POA: Insufficient documentation

## 2019-09-07 DIAGNOSIS — N920 Excessive and frequent menstruation with regular cycle: Secondary | ICD-10-CM | POA: Diagnosis not present

## 2019-09-07 DIAGNOSIS — Z124 Encounter for screening for malignant neoplasm of cervix: Secondary | ICD-10-CM | POA: Insufficient documentation

## 2019-09-07 DIAGNOSIS — J45909 Unspecified asthma, uncomplicated: Secondary | ICD-10-CM | POA: Insufficient documentation

## 2019-10-30 DIAGNOSIS — H52223 Regular astigmatism, bilateral: Secondary | ICD-10-CM | POA: Diagnosis not present

## 2020-01-21 DIAGNOSIS — Z01419 Encounter for gynecological examination (general) (routine) without abnormal findings: Secondary | ICD-10-CM | POA: Diagnosis not present

## 2020-01-21 DIAGNOSIS — Z6832 Body mass index (BMI) 32.0-32.9, adult: Secondary | ICD-10-CM | POA: Diagnosis not present

## 2020-01-22 DIAGNOSIS — H02889 Meibomian gland dysfunction of unspecified eye, unspecified eyelid: Secondary | ICD-10-CM | POA: Diagnosis not present

## 2020-02-10 DIAGNOSIS — R87610 Atypical squamous cells of undetermined significance on cytologic smear of cervix (ASC-US): Secondary | ICD-10-CM | POA: Diagnosis not present

## 2020-02-10 DIAGNOSIS — A630 Anogenital (venereal) warts: Secondary | ICD-10-CM | POA: Diagnosis not present

## 2020-03-02 ENCOUNTER — Telehealth: Payer: Self-pay | Admitting: Internal Medicine

## 2020-03-02 DIAGNOSIS — Z0000 Encounter for general adult medical examination without abnormal findings: Secondary | ICD-10-CM

## 2020-03-02 NOTE — Telephone Encounter (Signed)
Pt is scheduled for physical on 04/29/20 and would like to get her lab work done before appointment. She said she needs to make sure B12 is added to the lab order. I went ahead and scheduled her for 04/27/20.

## 2020-03-03 NOTE — Telephone Encounter (Signed)
Pt is scheduled for lab work on 04/27/2020. I have ordered lipid, tsh, vitamin b12, cbc, a1c, and cmp. Is there anything else that needs to be ordered.

## 2020-03-03 NOTE — Addendum Note (Signed)
Addended by: Adair Laundry on: 03/03/2020 04:03 PM   Modules accepted: Orders

## 2020-03-07 ENCOUNTER — Telehealth: Payer: 59 | Admitting: Family

## 2020-03-07 DIAGNOSIS — J301 Allergic rhinitis due to pollen: Secondary | ICD-10-CM | POA: Diagnosis not present

## 2020-03-07 MED ORDER — FLUTICASONE PROPIONATE 50 MCG/ACT NA SUSP: 2.0000 | Freq: Every day | NASAL | 6 refills | Status: DC

## 2020-03-07 MED ORDER — LEVOCETIRIZINE DIHYDROCHLORIDE 5 MG PO TABS: 5.0000 mg | ORAL_TABLET | Freq: Every evening | ORAL | 1 refills | Status: DC

## 2020-03-07 NOTE — Progress Notes (Signed)
E visit for Allergic Rhinitis We are sorry that you are not feeling well.  Here is how we plan to help!  Based on what you have shared with me it looks like you have Allergic Rhinitis.  Rhinitis is when a reaction occurs that causes nasal congestion, runny nose, sneezing, and itching.  Most types of rhinitis are caused by an inflammation and are associated with symptoms in the eyes ears or throat. There are several types of rhinitis.  The most common are acute rhinitis, which is usually caused by a viral illness, allergic or seasonal rhinitis, and nonallergic or year-round rhinitis.  Nasal allergies occur certain times of the year.  Allergic rhinitis is caused when allergens in the air trigger the release of histamine in the body.  Histamine causes itching, swelling, and fluid to build up in the fragile linings of the nasal passages, sinuses and eyelids.  An itchy nose and clear discharge are common.  I recommend the following over the counter treatments: You should take a daily dose of antihistamine and Xyzal 5 mg take 1 tablet daily  I also would recommend a nasal spray: Flonase 2 sprays into each nostril once daily.   Given you do not have a fever or sore throat, lets try these things. If no improvement, you may need antibiotics.    HOME CARE:   You can use an over-the-counter saline nasal spray as needed  Avoid areas where there is heavy dust, mites, or molds  Stay indoors on windy days during the pollen season  Keep windows closed in home, at least in bedroom; use air conditioner.  Use high-efficiency house air filter  Keep windows closed in car, turn AC on re-circulate  Avoid playing out with dog during pollen season  GET HELP RIGHT AWAY IF:   If your symptoms do not improve within 10 days  You become short of breath  You develop yellow or green discharge from your nose for over 3 days  You have coughing fits  MAKE SURE YOU:   Understand these instructions  Will  watch your condition  Will get help right away if you are not doing well or get worse  Thank you for choosing an e-visit. Your e-visit answers were reviewed by a board certified advanced clinical practitioner to complete your personal care plan. Depending upon the condition, your plan could have included both over the counter or prescription medications. Please review your pharmacy choice. Be sure that the pharmacy you have chosen is open so that you can pick up your prescription now.  If there is a problem you may message your provider in [place] to have the prescription routed to another pharmacy. Your safety is important to [place]. If you have drug allergies check your prescription carefully.  For the next 24 hours, you can use MyChart to ask questions about today's visit, request a non-urgent call back, or ask for a work or school excuse from your e-visit provider. You will get an email in the next two days asking about your experience. I hope that your e-visit has been valuable and will speed your recovery.    Approximately 5 minutes was spent documenting and reviewing patient's chart.

## 2020-03-11 DIAGNOSIS — R58 Hemorrhage, not elsewhere classified: Secondary | ICD-10-CM | POA: Diagnosis not present

## 2020-03-11 DIAGNOSIS — L309 Dermatitis, unspecified: Secondary | ICD-10-CM | POA: Diagnosis not present

## 2020-03-11 DIAGNOSIS — D2272 Melanocytic nevi of left lower limb, including hip: Secondary | ICD-10-CM | POA: Diagnosis not present

## 2020-03-11 DIAGNOSIS — D2262 Melanocytic nevi of left upper limb, including shoulder: Secondary | ICD-10-CM | POA: Diagnosis not present

## 2020-03-11 DIAGNOSIS — D2261 Melanocytic nevi of right upper limb, including shoulder: Secondary | ICD-10-CM | POA: Diagnosis not present

## 2020-03-11 DIAGNOSIS — L820 Inflamed seborrheic keratosis: Secondary | ICD-10-CM | POA: Diagnosis not present

## 2020-03-11 DIAGNOSIS — D225 Melanocytic nevi of trunk: Secondary | ICD-10-CM | POA: Diagnosis not present

## 2020-03-11 DIAGNOSIS — D2271 Melanocytic nevi of right lower limb, including hip: Secondary | ICD-10-CM | POA: Diagnosis not present

## 2020-03-14 DIAGNOSIS — H278 Other specified disorders of lens: Secondary | ICD-10-CM | POA: Diagnosis not present

## 2020-03-14 DIAGNOSIS — H21261 Iris atrophy (essential) (progressive), right eye: Secondary | ICD-10-CM | POA: Diagnosis not present

## 2020-03-14 DIAGNOSIS — H26111 Localized traumatic opacities, right eye: Secondary | ICD-10-CM | POA: Diagnosis not present

## 2020-04-27 ENCOUNTER — Other Ambulatory Visit: Payer: Self-pay

## 2020-04-27 ENCOUNTER — Other Ambulatory Visit (INDEPENDENT_AMBULATORY_CARE_PROVIDER_SITE_OTHER): Payer: 59

## 2020-04-27 DIAGNOSIS — Z0000 Encounter for general adult medical examination without abnormal findings: Secondary | ICD-10-CM | POA: Diagnosis not present

## 2020-04-27 LAB — COMPREHENSIVE METABOLIC PANEL
ALT: 8 U/L (ref 0–35)
AST: 13 U/L (ref 0–37)
Albumin: 4.3 g/dL (ref 3.5–5.2)
Alkaline Phosphatase: 42 U/L (ref 39–117)
BUN: 12 mg/dL (ref 6–23)
CO2: 27 mEq/L (ref 19–32)
Calcium: 9.3 mg/dL (ref 8.4–10.5)
Chloride: 104 mEq/L (ref 96–112)
Creatinine, Ser: 0.63 mg/dL (ref 0.40–1.20)
GFR: 107.96 mL/min (ref >60.00)
Glucose, Bld: 92 mg/dL (ref 70–99)
Potassium: 4.7 mEq/L (ref 3.5–5.1)
Sodium: 138 mEq/L (ref 135–145)
Total Bilirubin: 0.4 mg/dL (ref 0.2–1.2)
Total Protein: 6.8 g/dL (ref 6.0–8.3)

## 2020-04-27 LAB — CBC WITH DIFFERENTIAL/PLATELET
Basophils Absolute: 0.1 10*3/uL (ref 0.0–0.1)
Basophils Relative: 1 % (ref 0.0–3.0)
Eosinophils Absolute: 0.7 10*3/uL (ref 0.0–0.7)
Eosinophils Relative: 11.8 % — ABNORMAL HIGH (ref 0.0–5.0)
HCT: 35.1 % — ABNORMAL LOW (ref 36.0–46.0)
Hemoglobin: 11.6 g/dL — ABNORMAL LOW (ref 12.0–15.0)
Lymphocytes Relative: 32.2 % (ref 12.0–46.0)
Lymphs Abs: 1.9 10*3/uL (ref 0.7–4.0)
MCHC: 33 g/dL (ref 30.0–36.0)
MCV: 81.4 fl (ref 78.0–100.0)
Monocytes Absolute: 0.5 10*3/uL (ref 0.1–1.0)
Monocytes Relative: 8.3 % (ref 3.0–12.0)
Neutro Abs: 2.8 10*3/uL (ref 1.4–7.7)
Neutrophils Relative %: 46.7 % (ref 43.0–77.0)
Platelets: 279 10*3/uL (ref 150.0–400.0)
RBC: 4.31 Mil/uL (ref 3.87–5.11)
RDW: 15.1 % (ref 11.5–15.5)
WBC: 6 10*3/uL (ref 4.0–10.5)

## 2020-04-27 LAB — LIPID PANEL
Cholesterol: 194 mg/dL (ref 0–200)
HDL: 66.8 mg/dL (ref >39.00)
LDL Cholesterol: 116 mg/dL — ABNORMAL HIGH (ref 0–99)
NonHDL: 127.52
Total CHOL/HDL Ratio: 3
Triglycerides: 57 mg/dL (ref 0.0–149.0)
VLDL: 11.4 mg/dL (ref 0.0–40.0)

## 2020-04-27 LAB — TSH: TSH: 4.05 u[IU]/mL (ref 0.35–4.50)

## 2020-04-27 LAB — VITAMIN B12: Vitamin B-12: 238 pg/mL (ref 211–911)

## 2020-04-27 LAB — HEMOGLOBIN A1C: Hgb A1c MFr Bld: 6.1 % (ref 4.6–6.5)

## 2020-04-27 NOTE — Progress Notes (Signed)
Attached are your recent fasting labs,  We will discuss in detail at your upcoming visit.  Regards,   Deborra Medina, MD

## 2020-04-28 ENCOUNTER — Encounter: Payer: Self-pay | Admitting: Internal Medicine

## 2020-04-28 ENCOUNTER — Ambulatory Visit (INDEPENDENT_AMBULATORY_CARE_PROVIDER_SITE_OTHER): Payer: 59 | Admitting: Internal Medicine

## 2020-04-28 VITALS — BP 118/74 | HR 77 | Temp 98.1°F | Resp 16 | Ht 66.0 in | Wt 206.2 lb

## 2020-04-28 DIAGNOSIS — Z6832 Body mass index (BMI) 32.0-32.9, adult: Secondary | ICD-10-CM

## 2020-04-28 DIAGNOSIS — K047 Periapical abscess without sinus: Secondary | ICD-10-CM | POA: Diagnosis not present

## 2020-04-28 DIAGNOSIS — Z0000 Encounter for general adult medical examination without abnormal findings: Secondary | ICD-10-CM

## 2020-04-28 DIAGNOSIS — E6609 Other obesity due to excess calories: Secondary | ICD-10-CM

## 2020-04-28 MED ORDER — AMOXICILLIN-POT CLAVULANATE 600-42.9 MG/5ML PO SUSR: ORAL | 0 refills | Status: DC

## 2020-04-28 MED ORDER — CYANOCOBALAMIN 1000 MCG/ML IJ SOLN: 1000.0000 ug | INTRAMUSCULAR | 0 refills | Status: DC

## 2020-04-28 MED ORDER — CHLORHEXIDINE GLUCONATE 0.12 % MT SOLN: 15.0000 mL | Freq: Two times a day (BID) | OROMUCOSAL | 0 refills | Status: DC

## 2020-04-28 NOTE — Progress Notes (Addendum)
Patient ID: CAMESHIA CRESSMAN, female    DOB: 28-Sep-1975  Age: 44 y.o. MRN: [id number]  The patient is here for  Preventive visit.  This visit occurred during the SARS-CoV-2 public health emergency.  Safety protocols were in place, including screening questions prior to the visit, additional usage of staff PPE, and extensive cleaning of exam room while observing appropriate contact time as indicated for disinfecting solutions.    Patient has received both doses of the available COVID 19 vaccine without complications.  Patient continues to mask when outside of the home except when walking in yard or at safe distances from others .  Patient denies any change in mood or development of unhealthy behaviors resuting from the pandemic's restriction of activities and socialization.    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  Negative colposcopy,  Last year.    The risk factors are reflected in the social history.  The roster of all physicians providing medical care to patient - is listed in the Snapshot section of the chart.  Activities of daily living:  The patient is 100% independent in all ADLs: dressing, toileting, feeding as well as independent mobility  Home safety : The patient has smoke detectors in the home. They wear seatbelts.  There are no firearms at home. There is no violence in the home.   There is no risks for hepatitis, STDs or HIV. There is no   history of blood transfusion. They have no travel history to infectious disease endemic areas of the world.  The patient has seen their dentist in the last six month. They have seen their eye doctor in the last year. They admit to slight hearing difficulty with regard to whispered voices and some television programs.  They have deferred audiologic testing in the last year.  They do not  have excessive sun exposure. Discussed the need for sun  protection: hats, long sleeves and use of sunscreen if there is significant sun exposure.   Diet: the importance of a healthy diet is discussed. They do have a healthy diet.  The benefits of regular aerobic exercise were discussed. She walks 4 times per week ,  20 minutes.   Depression screen: there are no signs or vegative symptoms of depression- irritability, change in appetite, anhedonia, sadness/tearfullness.  The following portions of the patient's history were reviewed and updated as appropriate: allergies, current medications, past family history, past medical history,  past surgical history, past social history  and problem list.  Visual acuity was not assessed per patient preference since she has regular follow up with her ophthalmologist. Hearing and body mass index were assessed and reviewed.   During the course of the visit the patient was educated and counseled about appropriate screening and preventive services including : fall prevention , diabetes screening, nutrition counseling, colorectal cancer screening, and recommended immunizations.    CC: The primary encounter diagnosis was Class 1 obesity due to excess calories without serious comorbidity with body mass index (BMI) of 32.0 to 32.9 in adult. Diagnoses of Dental infection and Encounter for preventive health examination were also pertinent to this visit.   History Lashanna has a past medical history of Anemia, Asthma, Chicken pox, Complication of anesthesia, and PONV (postoperative nausea and vomiting).   She has a past surgical history that includes Laparoscopic gastric banding (2008); breast augmentation (2003); Hernia repair; and Wisdom tooth extraction.   Her family history  includes Cancer (age of onset: 12) in her brother; Cancer (age of onset: 46) in her mother; Diabetes in her brother, maternal aunt, maternal grandmother, and mother.She reports that she has never smoked. She has never used smokeless tobacco. She reports  current alcohol use of about 3.0 standard drinks of alcohol per week. She reports that she does not use drugs.  Outpatient Medications Prior to Visit  Medication Sig Dispense Refill  . clobetasol cream (TEMOVATE) 1.10 % Apply 1 application topically as needed.    Marland Kitchen levocetirizine (XYZAL) 5 MG tablet Take 1 tablet (5 mg total) by mouth every evening. 90 tablet 1  . Multiple Vitamin (MULTIVITAMIN) tablet Take 2 tablets by mouth daily.    . Syringe/Needle, Disp, (SYRINGE 3CC/25GX1") 25G X 1" 3 ML MISC Use for b12 injections 50 each 0  . amoxicillin (AMOXIL) 250 MG/5ML suspension Take 250 mg by mouth 3 (three) times daily.    . cyanocobalamin (,VITAMIN B-12,) 1000 MCG/ML injection Inject 1 mL (1,000 mcg total) into the muscle every 30 (thirty) days. 10 mL 0  . amoxicillin (AMOXIL) 500 MG capsule Take 1 capsule (500 mg total) by mouth 3 (three) times daily. (Patient not taking: Reported on 04/28/2020) 21 capsule 0  . fluticasone (FLONASE) 50 MCG/ACT nasal spray Place 2 sprays into both nostrils daily. (Patient not taking: Reported on 04/28/2020) 16 g 6   No facility-administered medications prior to visit.    Review of Systems   Patient denies headache, fevers, malaise, unintentional weight loss, skin rash, eye pain, sinus congestion and sinus pain, sore throat, dysphagia,  hemoptysis , cough, dyspnea, wheezing, chest pain, palpitations, orthopnea, edema, abdominal pain, nausea, melena, diarrhea, constipation, flank pain, dysuria, hematuria, urinary  Frequency, nocturia, numbness, tingling, seizures,  Focal weakness, Loss of consciousness,  Tremor, insomnia, depression, anxiety, and suicidal ideation.      Objective:  BP 118/74 (BP Location: Left Arm, Patient Position: Sitting, Cuff Size: Large)   Pulse 77   Temp 98.1 F (36.7 C) (Oral)   Resp 16   Ht 5' 6"  (1.676 m)   Wt 206 lb 3.2 oz (93.5 kg)   SpO2 99%   BMI 33.28 kg/m   Physical Exam  General appearance: alert, cooperative and  appears stated age Ears: normal TM's and external ear canals both ears Throat: lips, mucosa, and tongue normal; teeth and gums normal Neck: no adenopathy, no carotid bruit, supple, symmetrical, trachea midline and thyroid not enlarged, symmetric, no tenderness/mass/nodules Back: symmetric, no curvature. ROM normal. No CVA tenderness. Lungs: clear to auscultation bilaterally Heart: regular rate and rhythm, S1, S2 normal, no murmur, click, rub or gallop Abdomen: soft, non-tender; bowel sounds normal; no masses,  no organomegaly Pulses: 2+ and symmetric Skin: Skin color, texture, turgor normal. No rashes or lesions Lymph nodes: Cervical, supraclavicular, and axillary nodes normal.  Assessment & Plan:   Problem List Items Addressed This Visit      Unprioritized   Obesity due to excess calories without serious comorbidity - Primary    I have addressed  BMI and recommended a low glycemic index diet utilizing smaller more frequent meals to increase metabolism.  I have also recommended that patient start exercising with a goal of 30 minutes of aerobic exercise a minimum of 5 days per week.        Encounter for preventive health examination    age appropriate education and counseling updated, referrals for preventative services and immunizations addressed, dietary and smoking counseling addressed, most recent labs reviewed.  I have personally reviewed and have noted:  1) the patient's medical and social history 2) The pt's use of alcohol, tobacco, and illicit drugs 3) The patient's current medications and supplements 4) Functional ability including ADL's, fall risk, home safety risk, hearing and visual impairment 5) Diet and physical activities 6) Evidence for depression or mood disorder 7) The patient's height, weight, and BMI have been recorded in the chart  I have made referrals, and provided counseling and education based on review of the above       Other Visit Diagnoses     Dental infection          I have discontinued Carita Pian. Voong's cyanocobalamin, amoxicillin, fluticasone, and amoxicillin. I am also having her start on cyanocobalamin, amoxicillin-clavulanate, and chlorhexidine. Additionally, I am having her maintain her multivitamin, clobetasol cream, SYRINGE 3CC/25GX1", and levocetirizine.  Meds ordered this encounter  Medications  . cyanocobalamin (,VITAMIN B-12,) 1000 MCG/ML injection    Sig: Inject 1 mL (1,000 mcg total) into the muscle once a week. For 4 weeks,  Then monthly thereafter    Dispense:  10 mL    Refill:  0  . amoxicillin-clavulanate (AUGMENTIN ES-600) 600-42.9 MG/5ML suspension    Sig: 7.5 ml twice daily    Dispense:  125 mL    Refill:  0  . chlorhexidine (PERIDEX) 0.12 % solution    Sig: Use as directed 15 mLs in the mouth or throat 2 (two) times daily.    Dispense:  120 mL    Refill:  0    Medications Discontinued During This Encounter  Medication Reason  . amoxicillin (AMOXIL) 500 MG capsule Error  . fluticasone (FLONASE) 50 MCG/ACT nasal spray   . cyanocobalamin (,VITAMIN B-12,) 1000 MCG/ML injection   . amoxicillin (AMOXIL) 250 MG/5ML suspension     Follow-up: Return in about 6 months (around 10/26/2020).   Crecencio Mc, MD

## 2020-04-28 NOTE — Patient Instructions (Addendum)
Increase  The b12 injections to weekly for 4 weeks,  Then resume monthly   For the uvula irritation:  Use a cloth headband as a chin strap to keep mouth closed  Used afrin at night if congested  gargle with salt water in the morning and as needed    Health Maintenance, Female Adopting a healthy lifestyle and getting preventive care are important in promoting health and wellness. Ask your health care provider about:  The right schedule for you to have regular tests and exams.  Things you can do on your own to prevent diseases and keep yourself healthy. What should I know about diet, weight, and exercise? Eat a healthy diet   Eat a diet that includes plenty of vegetables, fruits, low-fat dairy products, and lean protein.  Do not eat a lot of foods that are high in solid fats, added sugars, or sodium. Maintain a healthy weight Body mass index (BMI) is used to identify weight problems. It estimates body fat based on height and weight. Your health care provider can help determine your BMI and help you achieve or maintain a healthy weight. Get regular exercise Get regular exercise. This is one of the most important things you can do for your health. Most adults should:  Exercise for at least 150 minutes each week. The exercise should increase your heart rate and make you sweat (moderate-intensity exercise).  Do strengthening exercises at least twice a week. This is in addition to the moderate-intensity exercise.  Spend less time sitting. Even light physical activity can be beneficial. Watch cholesterol and blood lipids Have your blood tested for lipids and cholesterol at 44 years of age, then have this test every 5 years. Have your cholesterol levels checked more often if:  Your lipid or cholesterol levels are high.  You are older than 44 years of age.  You are at high risk for heart disease. What should I know about cancer screening? Depending on your health history and family  history, you may need to have cancer screening at various ages. This may include screening for:  Breast cancer.  Cervical cancer.  Colorectal cancer.  Skin cancer.  Lung cancer. What should I know about heart disease, diabetes, and high blood pressure? Blood pressure and heart disease  High blood pressure causes heart disease and increases the risk of stroke. This is more likely to develop in people who have high blood pressure readings, are of African descent, or are overweight.  Have your blood pressure checked: ? Every 3-5 years if you are 51-63 years of age. ? Every year if you are 18 years old or older. Diabetes Have regular diabetes screenings. This checks your fasting blood sugar level. Have the screening done:  Once every three years after age 87 if you are at a normal weight and have a low risk for diabetes.  More often and at a younger age if you are overweight or have a high risk for diabetes. What should I know about preventing infection? Hepatitis B If you have a higher risk for hepatitis B, you should be screened for this virus. Talk with your health care provider to find out if you are at risk for hepatitis B infection. Hepatitis C Testing is recommended for:  Everyone born from [birth year] through 1965.  Anyone with known risk factors for hepatitis C. Sexually transmitted infections (STIs)  Get screened for STIs, including gonorrhea and chlamydia, if: ? You are sexually active and are younger than 44 years of  age. ? You are older than 44 years of age and your health care provider tells you that you are at risk for this type of infection. ? Your sexual activity has changed since you were last screened, and you are at increased risk for chlamydia or gonorrhea. Ask your health care provider if you are at risk.  Ask your health care provider about whether you are at high risk for HIV. Your health care provider may recommend a prescription medicine to help prevent HIV  infection. If you choose to take medicine to prevent HIV, you should first get tested for HIV. You should then be tested every 3 months for as long as you are taking the medicine. Pregnancy  If you are about to stop having your period (premenopausal) and you may become pregnant, seek counseling before you get pregnant.  Take 400 to 800 micrograms (mcg) of folic acid every day if you become pregnant.  Ask for birth control (contraception) if you want to prevent pregnancy. Osteoporosis and menopause Osteoporosis is a disease in which the bones lose minerals and strength with aging. This can result in bone fractures. If you are 64 years old or older, or if you are at risk for osteoporosis and fractures, ask your health care provider if you should:  Be screened for bone loss.  Take a calcium or vitamin D supplement to lower your risk of fractures.  Be given hormone replacement therapy (HRT) to treat symptoms of menopause. Follow these instructions at home: Lifestyle  Do not use any products that contain nicotine or tobacco, such as cigarettes, e-cigarettes, and chewing tobacco. If you need help quitting, ask your health care provider.  Do not use street drugs.  Do not share needles.  Ask your health care provider for help if you need support or information about quitting drugs. Alcohol use  Do not drink alcohol if: ? Your health care provider tells you not to drink. ? You are pregnant, may be pregnant, or are planning to become pregnant.  If you drink alcohol: ? Limit how much you use to 0-1 drink a day. ? Limit intake if you are breastfeeding.  Be aware of how much alcohol is in your drink. In the [place] one drink equals one 12 oz bottle of beer (355 mL), one 5 oz glass of wine (148 mL), or one 1 oz glass of hard liquor (44 mL). General instructions  Schedule regular health, dental, and eye exams.  Stay current with your vaccines.  Tell your health care provider if: ? You  often feel depressed. ? You have ever been abused or do not feel safe at home. Summary  Adopting a healthy lifestyle and getting preventive care are important in promoting health and wellness.  Follow your health care provider's instructions about healthy diet, exercising, and getting tested or screened for diseases.  Follow your health care provider's instructions on monitoring your cholesterol and blood pressure. This information is not intended to replace advice given to you by your health care provider. Make sure you discuss any questions you have with your health care provider. Document Revised: 05/28/2018 Document Reviewed: 05/28/2018 Elsevier Patient Education  2020 [employer].

## 2020-04-29 ENCOUNTER — Encounter: Payer: 59 | Admitting: Internal Medicine

## 2020-05-01 DIAGNOSIS — K047 Periapical abscess without sinus: Secondary | ICD-10-CM | POA: Insufficient documentation

## 2020-05-01 NOTE — Assessment & Plan Note (Addendum)
Changing amoxicillin to augmentin, and adding chlorhexidine rinse

## 2020-05-01 NOTE — Assessment & Plan Note (Signed)

## 2020-05-01 NOTE — Assessment & Plan Note (Signed)
I have addressed  BMI and recommended a low glycemic index diet utilizing smaller more frequent meals to increase metabolism.  I have also recommended that patient start exercising with a goal of 30 minutes of aerobic exercise a minimum of 5 days per week.  

## 2020-05-04 DIAGNOSIS — D649 Anemia, unspecified: Secondary | ICD-10-CM

## 2020-05-30 ENCOUNTER — Telehealth: Payer: Self-pay | Admitting: Internal Medicine

## 2020-05-30 NOTE — Addendum Note (Signed)
Addended by: Crecencio Mc on: 05/30/2020 01:32 PM   Modules accepted: Level of Service

## 2020-05-30 NOTE — Telephone Encounter (Signed)
The patient called stating she was billed for DOS 04/28/20. However, she does not feel she should have been billed for a physical. I informed the patient that her ear infection was addressed at the appointment and management of her obesity. She stated that should she not have answered the question when asked, "How are you doing?" She stated if she new she would be billed she would not have said anything. Is there anything that could be done regarding this visit?

## 2020-05-30 NOTE — Telephone Encounter (Signed)
Since I treated her for an infection and she feels that the charge for the preventive exam should not have been  Charged,  I will drop that charge

## 2020-05-30 NOTE — Telephone Encounter (Signed)
Charge the patient for the physical and drop the charge for the acute issues.

## 2020-05-31 DIAGNOSIS — H169 Unspecified keratitis: Secondary | ICD-10-CM | POA: Diagnosis not present

## 2020-05-31 DIAGNOSIS — Z0000 Encounter for general adult medical examination without abnormal findings: Secondary | ICD-10-CM | POA: Insufficient documentation

## 2020-05-31 NOTE — Addendum Note (Signed)
Addended by: Crecencio Mc on: 05/31/2020 05:14 PM   Modules accepted: Level of Service

## 2020-05-31 NOTE — Telephone Encounter (Signed)
Why/  That's the opposite of what the patient wants,  And the documentation supports an acute charge.  No PAP/breast exam  was done either.

## 2020-05-31 NOTE — Telephone Encounter (Signed)
Sorry, I worded the request wrong. She does not want to pay for the acute issues that were addressed. She wants her insurance to pay only for the physical, because it is paid at 100%.

## 2020-05-31 NOTE — Assessment & Plan Note (Signed)

## 2020-06-15 DIAGNOSIS — Z1231 Encounter for screening mammogram for malignant neoplasm of breast: Secondary | ICD-10-CM | POA: Diagnosis not present

## 2020-06-15 LAB — HM MAMMOGRAPHY: HM Mammogram: NORMAL (ref 0–4)

## 2020-06-20 ENCOUNTER — Ambulatory Visit: Payer: 59 | Attending: Internal Medicine

## 2020-06-20 DIAGNOSIS — Z23 Encounter for immunization: Secondary | ICD-10-CM

## 2020-06-20 NOTE — Progress Notes (Signed)
   Covid-[facility] Vaccination [facility]  Name:  Alicia Blair    MRN: [id number] DOB: [birth year]  06/20/2020  Alicia Blair was observed post Covid-19 immunization for 15 minutes without incident. She was provided with Vaccine Information Sheet and instruction to access the V-Safe system.   Alicia Blair was instructed to call 911 with any severe reactions post vaccine: Marland Kitchen Difficulty breathing  . Swelling of face and throat  . A fast heartbeat  . A bad rash all over body  . Dizziness and weakness   Immunizations Administered    Name Date Dose VIS Date Route   Pfizer COVID-19 Vaccine 06/20/2020  3:04 PM 0.3 mL 04/06/2020 Intramuscular   Manufacturer: [place]   Lot: [id number]   [place]: [id number]

## 2020-06-24 DIAGNOSIS — T2662XA Corrosion of cornea and conjunctival sac, left eye, initial encounter: Secondary | ICD-10-CM | POA: Diagnosis not present

## 2020-08-24 ENCOUNTER — Encounter: Payer: Self-pay | Admitting: Internal Medicine

## 2020-08-24 ENCOUNTER — Ambulatory Visit: Payer: 59 | Admitting: Internal Medicine

## 2020-08-24 ENCOUNTER — Other Ambulatory Visit: Payer: Self-pay

## 2020-08-24 VITALS — BP 136/86 | HR 97 | Temp 97.9°F | Ht 66.0 in | Wt 207.8 lb

## 2020-08-24 DIAGNOSIS — D649 Anemia, unspecified: Secondary | ICD-10-CM

## 2020-08-24 DIAGNOSIS — E559 Vitamin D deficiency, unspecified: Secondary | ICD-10-CM | POA: Diagnosis not present

## 2020-08-24 DIAGNOSIS — K219 Gastro-esophageal reflux disease without esophagitis: Secondary | ICD-10-CM

## 2020-08-24 DIAGNOSIS — E538 Deficiency of other specified B group vitamins: Secondary | ICD-10-CM | POA: Diagnosis not present

## 2020-08-24 DIAGNOSIS — R0781 Pleurodynia: Secondary | ICD-10-CM | POA: Diagnosis not present

## 2020-08-24 DIAGNOSIS — R1011 Right upper quadrant pain: Secondary | ICD-10-CM | POA: Diagnosis not present

## 2020-08-24 DIAGNOSIS — R1013 Epigastric pain: Secondary | ICD-10-CM

## 2020-08-24 DIAGNOSIS — R5383 Other fatigue: Secondary | ICD-10-CM | POA: Diagnosis not present

## 2020-08-24 DIAGNOSIS — R030 Elevated blood-pressure reading, without diagnosis of hypertension: Secondary | ICD-10-CM

## 2020-08-24 DIAGNOSIS — R0789 Other chest pain: Secondary | ICD-10-CM

## 2020-08-24 NOTE — Patient Instructions (Signed)
Monitor your blood pressure the goal is <130/<80   Abdominal Pain, Adult Pain in the abdomen (abdominal pain) can be caused by many things. Often, abdominal pain is not serious and it gets better with no treatment or by being treated at home. However, sometimes abdominal pain is serious. Your health care provider will ask questions about your medical history and do a physical exam to try to determine the cause of your abdominal pain. Follow these instructions at home: Medicines  Take over-the-counter and prescription medicines only as told by your health care provider.  Do not take a laxative unless told by your health care provider. General instructions  Watch your condition for any changes.  Drink enough fluid to keep your urine pale yellow.  Keep all follow-up visits as told by your health care provider. This is important.   Contact a health care provider if:  Your abdominal pain changes or gets worse.  You are not hungry or you lose weight without trying.  You are constipated or have diarrhea for more than 2-3 days.  You have pain when you urinate or have a bowel movement.  Your abdominal pain wakes you up at night.  Your pain gets worse with meals, after eating, or with certain foods.  You are vomiting and cannot keep anything down.  You have a fever.  You have blood in your urine. Get help right away if:  Your pain does not go away as soon as your health care provider told you to expect.  You cannot stop vomiting.  Your pain is only in areas of the abdomen, such as the right side or the left lower portion of the abdomen. Pain on the right side could be caused by appendicitis.  You have bloody or black stools, or stools that look like tar.  You have severe pain, cramping, or bloating in your abdomen.  You have signs of dehydration, such as: ? Dark urine, very little urine, or no urine. ? Cracked lips. ? Dry mouth. ? Sunken  eyes. ? Sleepiness. ? Weakness.  You have trouble breathing or chest pain. Summary  Often, abdominal pain is not serious and it gets better with no treatment or by being treated at home. However, sometimes abdominal pain is serious.  Watch your condition for any changes.  Take over-the-counter and prescription medicines only as told by your health care provider.  Contact a health care provider if your abdominal pain changes or gets worse.  Get help right away if you have severe pain, cramping, or bloating in your abdomen. This information is not intended to replace advice given to you by your health care provider. Make sure you discuss any questions you have with your health care provider. Document Revised: 07/24/2019 Document Reviewed: 10/13/2018 Elsevier Patient Education  [place].

## 2020-08-24 NOTE — Progress Notes (Signed)
Chief Complaint  Patient presents with  . Abdominal Pain   F/u  1. Heartburn, Epigastric and right upper ab pain/rib pain on and off x 3 weeks dull/discomfort and intermittent no trigger. Nothing tried pain 1-3/10 no med tried. She has a port still in her abdomen and getting it out in the summer will f/u with Dr. Milus Mallick in [facility] appt upcoming  Denies n/v S/p gastric lab band in 2008 and wants upper GI series declines EGD for now which I think would be best LMP was 08/05/20  2. C/o fatigue on B12 injection h/o low B12 she is sleeping ok, no etoh, eating clean and drinking wate r 3. BP sl elevated today with H/a and at times leg edema she takes hctz 25 mg qd even though supposed to take 12.5 mg  We disc otc compression stockings [employer] coppertone   Review of Systems  Constitutional: Positive for malaise/fatigue. Negative for weight loss.  HENT: Negative for hearing loss.   Eyes: Negative for blurred vision.  Respiratory: Negative for shortness of breath.   Cardiovascular: Negative for chest pain.  Gastrointestinal: Positive for abdominal pain. Negative for nausea and vomiting.  Musculoskeletal: Negative for falls and joint pain.  Skin: Negative for rash.  Neurological: Negative for headaches.  Psychiatric/Behavioral: Negative for depression.   Past Medical History:  Diagnosis Date  . Anemia    with pregnancy  . Asthma    as a child  . Chicken pox   . Complication of anesthesia   . PONV (postoperative nausea and vomiting)    Past Surgical History:  Procedure Laterality Date  . breast augmentation  2003  . HERNIA REPAIR    . LAPAROSCOPIC GASTRIC BANDING  2008  . WISDOM TOOTH EXTRACTION     Family History  Problem Relation Age of Onset  . Diabetes Mother   . Cancer Mother 63       endometrial CA  . Atrial fibrillation Mother   . Hypertension Mother   . Hyperlipidemia Mother   . Diabetes Maternal Aunt   . Diabetes Maternal  Grandmother   . Cancer Brother 21       testicular ca  . Diabetes Brother    Social History   Socioeconomic History  . Marital status: Married    Spouse name: Not on file  . Number of children: Not on file  . Years of education: Not on file  . Highest education level: Not on file  Occupational History  . Not on file  Tobacco Use  . Smoking status: Never Smoker  . Smokeless tobacco: Never Used  Vaping Use  . Vaping Use: Never used  Substance and Sexual Activity  . Alcohol use: Yes    Alcohol/week: 3.0 standard drinks    Types: 3 Cans of beer per week    Comment: social, none since pregnanc y  . Drug use: No  . Sexual activity: Yes    Birth control/protection: None  Other Topics Concern  . Not on file  Social History Narrative   RN in the OR at [employer] .   Social Determinants of Health   Financial Resource Strain: Not on file  Food Insecurity: Not on file  Transportation Needs: Not on file  Physical Activity: Not on file  Stress: Not on file  Social Connections: Not on file  Intimate Partner Violence: Not on file   Current Meds  Medication Sig  . chlorhexidine (PERIDEX) 0.12 % solution Use as directed 15  mLs in the mouth or throat 2 (two) times daily.  . clobetasol cream (TEMOVATE) 6.62 % Apply 1 application topically as needed.  . cyanocobalamin (,VITAMIN B-12,) 1000 MCG/ML injection Inject 1 mL (1,000 mcg total) into the muscle once a week. For 4 weeks,  Then monthly thereafter  . hydrochlorothiazide (HYDRODIURIL) 25 MG tablet Take 25 mg by mouth daily.  . Multiple Vitamin (MULTIVITAMIN) tablet Take 2 tablets by mouth daily.  . Syringe/Needle, Disp, (SYRINGE 3CC/25GX1") 25G X 1" 3 ML MISC Use for b12 injections   Allergies  Allergen Reactions  . Codeine Nausea And Vomiting  . Erythromycin Nausea And Vomiting   No results found for this or any previous visit (from the past 2160 hour(s)). Objective  Body mass index is 33.54 kg/m. Wt Readings from Last 3  Encounters:  08/24/20 207 lb 12.8 oz (94.3 kg)  04/28/20 206 lb 3.2 oz (93.5 kg)  04/24/19 208 lb (94.3 kg)   Temp Readings from Last 3 Encounters:  08/24/20 97.9 F (36.6 C) (Oral)  04/28/20 98.1 F (36.7 C) (Oral)  04/24/19 98.2 F (36.8 C) (Temporal)   BP Readings from Last 3 Encounters:  08/24/20 136/86  04/28/20 118/74  04/24/19 138/90   Pulse Readings from Last 3 Encounters:  08/24/20 97  04/28/20 77  04/24/19 84    Physical Exam Vitals and nursing note reviewed.  Constitutional:      Appearance: Normal appearance. She is well-developed and well-groomed. She is obese.  HENT:     Head: Normocephalic and atraumatic.  Eyes:     Conjunctiva/sclera: Conjunctivae normal.     Pupils: Pupils are equal, round, and reactive to light.  Cardiovascular:     Rate and Rhythm: Normal rate and regular rhythm.     Heart sounds: Normal heart sounds. No murmur heard.   Pulmonary:     Effort: Pulmonary effort is normal.     Breath sounds: Normal breath sounds.  Abdominal:     Tenderness: There is abdominal tenderness in the right upper quadrant and epigastric area. There is no right CVA tenderness or left CVA tenderness.    Skin:    General: Skin is warm and dry.  Neurological:     General: No focal deficit present.     Mental Status: She is alert and oriented to person, place, and time. Mental status is at baseline.     Gait: Gait normal.  Psychiatric:        Attention and Perception: Attention and perception normal.        Mood and Affect: Mood and affect normal.        Speech: Speech normal.        Behavior: Behavior normal. Behavior is cooperative.        Thought Content: Thought content normal.        Cognition and Memory: Cognition and memory normal.        Judgment: Judgment normal.     Assessment  Plan  Right upper quadrant abdominal pain with epigastric pain and GERD sx's/chest pressure r/o MSK with rib Xray - Plan: Comprehensive metabolic panel, CBC w/Diff,  US Abdomen Complete r/o GB disease, DG UGI W SINGLE CM (SOL OR THIN BA), US Abdomen Complete, Urinalysis, Routine w reflex microscopic,  F/u with Dr. Catha Brow in [place] s/p gastric lab band 2008  Consider EGD in the future with bariatric surgery vs gen surgery Dr. Johnathan Hausen in [place] Right rib Xray   Vitamin D deficiency - Plan: Vitamin D (  25 hydroxy)  Fatigue, unspecified type - Plan: see labs today above  Check also B12, vitamin D Consider EGD/colonoscopy in the future  Make sure health maintenance utd with PCP  B12 deficiency - Plan: B12 on B12 injections  Elevated BP and leg edema  F/u with PCP on hctz 25 mg qd  Disc compression stockings coppertone [employer] otc   Provider: Dr. Olivia Mackie McLean-Scocuzza-[facility] Medicine

## 2020-08-24 NOTE — Addendum Note (Signed)
Addended by: Orland Mustard on: 08/24/2020 05:41 PM   Modules accepted: Orders

## 2020-08-25 ENCOUNTER — Other Ambulatory Visit: Payer: 59

## 2020-08-25 ENCOUNTER — Ambulatory Visit: Payer: 59

## 2020-08-25 DIAGNOSIS — D649 Anemia, unspecified: Secondary | ICD-10-CM | POA: Diagnosis not present

## 2020-08-25 LAB — CBC WITH DIFFERENTIAL/PLATELET
Basophils Absolute: 0.1 10*3/uL (ref 0.0–0.1)
Basophils Relative: 1.1 % (ref 0.0–3.0)
Eosinophils Absolute: 0.2 10*3/uL (ref 0.0–0.7)
Eosinophils Relative: 2.2 % (ref 0.0–5.0)
HCT: 35.1 % — ABNORMAL LOW (ref 36.0–46.0)
Hemoglobin: 11.6 g/dL — ABNORMAL LOW (ref 12.0–15.0)
Lymphocytes Relative: 34.1 % (ref 12.0–46.0)
Lymphs Abs: 2.4 10*3/uL (ref 0.7–4.0)
MCHC: 32.9 g/dL (ref 30.0–36.0)
MCV: 79.5 fl (ref 78.0–100.0)
Monocytes Absolute: 0.6 10*3/uL (ref 0.1–1.0)
Monocytes Relative: 8.8 % (ref 3.0–12.0)
Neutro Abs: 3.8 10*3/uL (ref 1.4–7.7)
Neutrophils Relative %: 53.8 % (ref 43.0–77.0)
Platelets: 289 10*3/uL (ref 150.0–400.0)
RBC: 4.42 Mil/uL (ref 3.87–5.11)
RDW: 16.3 % — ABNORMAL HIGH (ref 11.5–15.5)
WBC: 7.1 10*3/uL (ref 4.0–10.5)

## 2020-08-25 LAB — COMPREHENSIVE METABOLIC PANEL
ALT: 9 U/L (ref 0–35)
AST: 12 U/L (ref 0–37)
Albumin: 4.3 g/dL (ref 3.5–5.2)
Alkaline Phosphatase: 37 U/L — ABNORMAL LOW (ref 39–117)
BUN: 12 mg/dL (ref 6–23)
CO2: 25 mEq/L (ref 19–32)
Calcium: 9.5 mg/dL (ref 8.4–10.5)
Chloride: 105 mEq/L (ref 96–112)
Creatinine, Ser: 0.74 mg/dL (ref 0.40–1.20)
GFR: 98.24 mL/min (ref >60.00)
Glucose, Bld: 93 mg/dL (ref 70–99)
Potassium: 4.3 mEq/L (ref 3.5–5.1)
Sodium: 139 mEq/L (ref 135–145)
Total Bilirubin: 0.2 mg/dL (ref 0.2–1.2)
Total Protein: 7 g/dL (ref 6.0–8.3)

## 2020-08-25 LAB — URINALYSIS, ROUTINE W REFLEX MICROSCOPIC
Bilirubin, UA: NEGATIVE
Glucose, UA: NEGATIVE
Ketones, UA: NEGATIVE
Leukocytes,UA: NEGATIVE
Nitrite, UA: NEGATIVE
Protein,UA: NEGATIVE
RBC, UA: NEGATIVE
Specific Gravity, UA: 1.017 (ref 1.005–1.030)
Urobilinogen, Ur: 0.2 mg/dL (ref 0.2–1.0)
pH, UA: 5.5 (ref 5.0–7.5)

## 2020-08-25 LAB — VITAMIN D 25 HYDROXY (VIT D DEFICIENCY, FRACTURES): VITD: 29.76 ng/mL — ABNORMAL LOW (ref 30.00–100.00)

## 2020-08-25 LAB — VITAMIN B12: Vitamin B-12: 252 pg/mL (ref 211–911)

## 2020-08-26 LAB — IRON,TIBC AND FERRITIN PANEL
%SAT: 7 % (calc) — ABNORMAL LOW (ref 16–45)
Ferritin: 3 ng/mL — ABNORMAL LOW (ref 16–232)
Iron: 26 ug/dL — ABNORMAL LOW (ref 40–190)
TIBC: 390 mcg/dL (calc) (ref 250–450)

## 2020-08-31 ENCOUNTER — Other Ambulatory Visit: Payer: 59

## 2020-09-02 ENCOUNTER — Ambulatory Visit
Admission: RE | Admit: 2020-09-02 | Discharge: 2020-09-02 | Disposition: A | Discharge status: Home / Self Care | Payer: 59 | Source: Ambulatory Visit | Attending: Internal Medicine | Admitting: Internal Medicine

## 2020-09-02 ENCOUNTER — Other Ambulatory Visit: Payer: Self-pay | Admitting: Internal Medicine

## 2020-09-02 DIAGNOSIS — R1013 Epigastric pain: Secondary | ICD-10-CM

## 2020-09-02 DIAGNOSIS — R1011 Right upper quadrant pain: Secondary | ICD-10-CM

## 2020-09-02 DIAGNOSIS — Z01818 Encounter for other preprocedural examination: Secondary | ICD-10-CM | POA: Diagnosis not present

## 2020-09-12 ENCOUNTER — Ambulatory Visit: Payer: 59 | Admitting: Internal Medicine

## 2020-09-12 ENCOUNTER — Other Ambulatory Visit: Payer: Self-pay

## 2020-09-12 ENCOUNTER — Encounter: Payer: Self-pay | Admitting: Internal Medicine

## 2020-09-12 VITALS — BP 112/84 | HR 88 | Temp 98.0°F | Ht 66.0 in | Wt 212.0 lb

## 2020-09-12 DIAGNOSIS — D509 Iron deficiency anemia, unspecified: Secondary | ICD-10-CM

## 2020-09-12 DIAGNOSIS — R7303 Prediabetes: Secondary | ICD-10-CM | POA: Diagnosis not present

## 2020-09-12 DIAGNOSIS — E6609 Other obesity due to excess calories: Secondary | ICD-10-CM | POA: Diagnosis not present

## 2020-09-12 DIAGNOSIS — E611 Iron deficiency: Secondary | ICD-10-CM | POA: Diagnosis not present

## 2020-09-12 DIAGNOSIS — E538 Deficiency of other specified B group vitamins: Secondary | ICD-10-CM

## 2020-09-12 DIAGNOSIS — Z6832 Body mass index (BMI) 32.0-32.9, adult: Secondary | ICD-10-CM

## 2020-09-12 DIAGNOSIS — E559 Vitamin D deficiency, unspecified: Secondary | ICD-10-CM | POA: Diagnosis not present

## 2020-09-12 DIAGNOSIS — R609 Edema, unspecified: Secondary | ICD-10-CM | POA: Diagnosis not present

## 2020-09-12 DIAGNOSIS — R600 Localized edema: Secondary | ICD-10-CM | POA: Diagnosis not present

## 2020-09-12 LAB — MICROALBUMIN / CREATININE URINE RATIO
Creatinine,U: 47.8 mg/dL
Microalb Creat Ratio: 1.5 mg/g (ref 0.0–30.0)
Microalb, Ur: <0.7 mg/dL (ref 0.0–1.9)

## 2020-09-12 NOTE — Progress Notes (Signed)
Subjective:  Patient ID: Alicia Blair, female    DOB: 10/13/1975  Age: 45 y.o. MRN: [id number]  CC: The primary encounter diagnosis was Edema, unspecified type. Diagnoses of B12 deficiency, Vitamin D deficiency, Iron deficiency, Prediabetes, Class 1 obesity due to excess calories without serious comorbidity with body mass index (BMI) of 32.0 to 32.9 in adult, Iron deficiency anemia, unspecified iron deficiency anemia type, and Bilateral lower extremity edema were also pertinent to this visit.  HPI Alicia Blair presents for follow up on hypertension and other chronic issues   This visit occurred during the SARS-CoV-2 public health emergency.  Safety protocols were in place, including screening questions prior to the visit, additional usage of staff PPE, and extensive cleaning of exam room while observing appropriate contact time as indicated for disinfecting solutions.   Alicia Blair is a 45 yr old female with a history of gastric banding 9 years ago  Who presents for follow up on recent evaluation of abdominal pain. HX:  She presents with 3-4 weeks of RUQ pain that was constant and unaccompanied by shortness of breath and cough.  The pain has now resolved .  A complete abdominal ultrasound and a DG esophagus with contrast were  recently done and reviewed:  All normal.   No evidence of outflow obstruction,  And gastric band appeared to be in place.  Labs indicated a  Mild anemia with iron, b12 and vitamin d deficiencies.   She no longer has the RUQ pain but has other concerns today.  She notes weight gain, and note described as 1+ pitting edema that starts daily around  9 am.  She denies orthopnea.  She does not exercise regularly but walks for 5 to 30 minutes daily. She has exercise equipment at home but feels unmotivated to use it due to recurrent episodes of light headedness and nausea which have occurring for the past several months,  But not  daily .   Had an episode of malaise accompanied  by low BS of 58  and BP of 140/90 at work  Diet reviewed . She measures everything and has a careful diet until 3 pm, then becomes ravenous  And consumes apples,  Crackers,  Anything to fill up  REviewed her success at weight loss .  Following her banding procedure she reached a nadir of 155 lbs.  However she feels happiest when her weight is around 180 and notes that the weight regain occurred only 4 years ago,  After her daughter was born  Because she had  stopped exercising .  She has a supportive husband and works full time.  She has decided to have the gastric band removed .    Outpatient Medications Prior to Visit  Medication Sig Dispense Refill   chlorhexidine (PERIDEX) 0.12 % solution Use as directed 15 mLs in the mouth or throat 2 (two) times daily. 120 mL 0   clobetasol cream (TEMOVATE) 0.35 % Apply 1 application topically as needed.     cyanocobalamin (,VITAMIN B-12,) 1000 MCG/ML injection Inject 1 mL (1,000 mcg total) into the muscle once a week. For 4 weeks,  Then monthly thereafter 10 mL 0   hydrochlorothiazide (HYDRODIURIL) 25 MG tablet Take 25 mg by mouth daily.     Multiple Vitamin (MULTIVITAMIN) tablet Take 2 tablets by mouth daily.     Syringe/Needle, Disp, (SYRINGE 3CC/25GX1") 25G X 1" 3 ML MISC Use for b12 injections 50 each 0   No facility-administered medications prior to visit.  Review of Systems;  Patient denies headache, fevers,  unintentional weight loss, skin rash, eye pain, sinus congestion and sinus pain, sore throat, dysphagia,  hemoptysis , cough, dyspnea, wheezing, chest pain, palpitations, orthopnea, edema, abdominal pain, nausea, melena, diarrhea, constipation, flank pain, dysuria, hematuria, urinary  Frequency, nocturia, numbness, tingling, seizures,  Focal weakness, Loss of consciousness,  Tremor, insomnia, depression, anxiety, and suicidal ideation.      Objective:  BP 112/84 (BP Location: Left Arm, Patient Position: Sitting, Cuff Size: Large)     Pulse 88    Temp 98 F (36.7 C) (Oral)    Ht 5' 6"  (1.676 m)    Wt 212 lb (96.2 kg)    LMP 08/29/2020    SpO2 98%    BMI 34.22 kg/m   BP Readings from Last 3 Encounters:  09/12/20 112/84  08/24/20 136/86  04/28/20 118/74    Wt Readings from Last 3 Encounters:  09/12/20 212 lb (96.2 kg)  08/24/20 207 lb 12.8 oz (94.3 kg)  04/28/20 206 lb 3.2 oz (93.5 kg)    General appearance: alert, cooperative and appears stated age Ears: normal TM's and external ear canals both ears Throat: lips, mucosa, and tongue normal; teeth and gums normal Neck: no adenopathy, no carotid bruit, supple, symmetrical, trachea midline and thyroid not enlarged, symmetric, no tenderness/mass/nodules Back: symmetric, no curvature. ROM normal. No CVA tenderness. Lungs: clear to auscultation bilaterally Heart: regular rate and rhythm, S1, S2 normal, no murmur, click, rub or gallop Abdomen: soft, non-tender; bowel sounds normal; no masses,  no organomegaly Pulses: 2+ and symmetric Skin: Skin color, texture, turgor normal. No rashes or lesions Lymph nodes: Cervical, supraclavicular, and axillary nodes normal.  Lab Results  Component Value Date   HGBA1C 6.1 04/27/2020   HGBA1C 5.7 04/22/2019   HGBA1C 5.7 05/01/2018    Lab Results  Component Value Date   CREATININE 0.74 08/24/2020   CREATININE 0.63 04/27/2020   CREATININE 0.63 04/22/2019    Lab Results  Component Value Date   WBC 7.1 08/24/2020   HGB 11.6 (L) 08/24/2020   HCT 35.1 (L) 08/24/2020   PLT 289.0 08/24/2020   GLUCOSE 93 08/24/2020   CHOL 194 04/27/2020   TRIG 57.0 04/27/2020   HDL 66.80 04/27/2020   LDLCALC 116 (H) 04/27/2020   ALT 9 08/24/2020   AST 12 08/24/2020   NA 139 08/24/2020   K 4.3 08/24/2020   CL 105 08/24/2020   CREATININE 0.74 08/24/2020   BUN 12 08/24/2020   CO2 25 08/24/2020   TSH 4.05 04/27/2020   HGBA1C 6.1 04/27/2020   MICROALBUR <0.7 09/12/2020    US Abdomen Complete  Result Date: 09/02/2020 CLINICAL DATA:   Right upper quadrant pain, status post gastric bypass EXAM: ABDOMEN ULTRASOUND COMPLETE COMPARISON:  None. FINDINGS: Gallbladder: No gallstones or wall thickening visualized. No sonographic Murphy sign noted by sonographer. Common bile duct: Diameter: 3 mm Liver: No focal lesion identified. Within normal limits in parenchymal echogenicity. Portal vein is patent on color Doppler imaging with normal direction of blood flow towards the liver. IVC: No abnormality visualized. Pancreas: Visualized portion unremarkable. Spleen: Size and appearance within normal limits. Right Kidney: Length: 12.2. Echogenicity within normal limits. No mass or hydronephrosis visualized. Left Kidney: Length: 14.3. Echogenicity within normal limits. No mass or hydronephrosis visualized. Abdominal aorta: No aneurysm visualized. Other findings: None. IMPRESSION: Normal ultrasound examination of the abdomen. No ultrasound findings to explain pain. Consider CT or MRI to further evaluate otherwise unexplained abdominal pain. Electronically Signed  By: Eddie Candle M.D.   On: 09/02/2020 09:29   DG UGI W SINGLE CM (SOL OR THIN BA)  Result Date: 09/02/2020 CLINICAL DATA:  History of gastric band. Preop evaluation before removal of gastric band EXAM: UPPER GI SERIES WITH KUB TECHNIQUE: After obtaining a scout radiograph a routine upper GI series was performed using thin barium FLUOROSCOPY TIME:  Fluoroscopy Time:  1 minutes 6 seconds Radiation Exposure Index (if provided by the fluoroscopic device): 106 mGy Number of Acquired Spot Images: 0 COMPARISON:  None. FINDINGS: On the scout radiograph the gastric band is identified and appears in appropriate position in the left upper quadrant of the abdomen in the expected location of the GE junction. Visualized portions of the gastric band catheter and reservoir appear intact. The esophagus appears patent. No signs of stricture or mass. Ingested barium easily passed through the banded segment of the  proximal stomach with opacification of the mid and distal stomach. Prompt emptying of the stomach noted. The duodenal bulb and C loop appear within normal limits. No signs of hiatal hernia identified. No significant reflux visualized. IMPRESSION: 1. Postsurgical changes consistent with previous gastric banding identified. Normal anatomic appearance of the esophagus, mid and distal stomach and duodenum. Electronically Signed   By: Kerby Moors M.D.   On: 09/02/2020 09:49    Assessment & Plan:   Problem List Items Addressed This Visit      Unprioritized   Iron deficiency anemia    Unclear etiology:  Menorrhagia,  Gastric ulcer or decreased absorption due to reduced surface area of stomach.  FOBT ordered and GI referral to follow if positive.  Continue iron supplementation and recheck levels in 6 weeks       B12 deficiency    Recurrent,  Since her banding  ADVISED TO RESUME MONTHLY INJECTIONS>  RX SENT TO [facility] .  Repeat labs in 6 weeks   Lab Results  Component Value Date   VITAMINB12 252 08/24/2020         Relevant Orders   Vitamin B12   Obesity due to excess calories without serious comorbidity    Her diet is fairly stringent . Encouraged to resume daily exercise in some form that she enjoys, both to help her achieve her goals and to provide work/life balance      Bilateral lower extremity edema    Recurrent,  With 2 previous evaluations IN May and Nov 2020.  DVT ruled out and venous insufficiency discussed.  Checked urine for   Proteinuria: negative.  hctz prn,  Exercise and use of Venous compression stockings advised.   Lab Results  Component Value Date   LABMICR Comment 08/24/2020   MICROALBUR <0.7 09/12/2020            Other Visit Diagnoses    Edema, unspecified type    -  Primary   Relevant Orders   Basic metabolic panel   Microalbumin / creatinine urine ratio (Completed)   Vitamin D deficiency       Relevant Orders   VITAMIN D 25 Hydroxy (Vit-D Deficiency,  Fractures)   Iron deficiency       Relevant Orders   IBC + Ferritin   Fecal occult blood, imunochemical   Prediabetes       Relevant Orders   Hemoglobin A1c      I am having [facility] maintain her multivitamin, clobetasol cream, SYRINGE 3CC/25GX1", cyanocobalamin, chlorhexidine, and hydrochlorothiazide.  No orders of the defined types were placed in this  encounter.   There are no discontinued medications.  Follow-up: No follow-ups on file.   Crecencio Mc, MD

## 2020-09-12 NOTE — Patient Instructions (Addendum)
D3 4000 ius daily  b12 shot every 2 weeks  Iron not more than once daily with orange  Stool card to rule out GI bleeding as a cause of the nausea  Urine test to rule out proteinuria as a cause of the fluid retention  Ok to use hctz as needed  Repeat labs in 6 weeks

## 2020-09-13 DIAGNOSIS — D509 Iron deficiency anemia, unspecified: Secondary | ICD-10-CM | POA: Insufficient documentation

## 2020-09-13 NOTE — Assessment & Plan Note (Signed)
Recurrent,  With 2 previous evaluations IN May and Nov 2020.  DVT ruled out and venous insufficiency discussed.  Checked urine for   Proteinuria: negative.  hctz prn,  Exercise and use of Venous compression stockings advised.   Lab Results  Component Value Date   LABMICR Comment 08/24/2020   MICROALBUR <0.7 09/12/2020

## 2020-09-13 NOTE — Assessment & Plan Note (Signed)
Her diet is fairly stringent . Encouraged to resume daily exercise in some form that she enjoys, both to help her achieve her goals and to provide work/life balance

## 2020-09-13 NOTE — Assessment & Plan Note (Signed)
Unclear etiology:  Menorrhagia,  Gastric ulcer or decreased absorption due to reduced surface area of stomach.  FOBT ordered and GI referral to follow if positive.  Continue iron supplementation and recheck levels in 6 weeks

## 2020-09-13 NOTE — Assessment & Plan Note (Addendum)
Recurrent,  Since her banding  ADVISED TO RESUME MONTHLY INJECTIONS>  [facility] SENT [facility] .  Repeat labs in 6 weeks   Lab Results  Component Value Date   VITAMINB12 252 08/24/2020

## 2020-09-16 DIAGNOSIS — H26101 Unspecified traumatic cataract, right eye: Secondary | ICD-10-CM | POA: Diagnosis not present

## 2020-09-16 DIAGNOSIS — H43811 Vitreous degeneration, right eye: Secondary | ICD-10-CM | POA: Diagnosis not present

## 2020-10-09 ENCOUNTER — Other Ambulatory Visit: Payer: Self-pay

## 2020-10-09 ENCOUNTER — Ambulatory Visit
Admission: RE | Admit: 2020-10-09 | Discharge: 2020-10-09 | Disposition: A | Discharge status: Home / Self Care | Payer: 59 | Source: Ambulatory Visit

## 2020-10-09 VITALS — BP 129/77 | HR 86 | Temp 98.2°F | Resp 18 | Ht 67.0 in | Wt 204.0 lb

## 2020-10-09 DIAGNOSIS — K122 Cellulitis and abscess of mouth: Secondary | ICD-10-CM | POA: Diagnosis not present

## 2020-10-09 MED ORDER — METHYLPREDNISOLONE 4 MG PO TBPK: ORAL_TABLET | ORAL | 0 refills | Status: DC

## 2020-10-09 NOTE — ED Triage Notes (Signed)
Pt c/o possible uvulitis for 3 days. She states she has had this before and usually treats it at home with ibuprofen and gargles. Pt denies f/n/v/d or other symptoms.

## 2020-10-09 NOTE — Discharge Instructions (Addendum)
Take the Medrol Dosepak according to the package instructions.  You can start today and take your breakfast and lunch dose together to get you back on schedule.  Continue to take your Zyrtec as there may be an allergic component to the pain and swelling of your uvula.  Gargle with warm salt water 2-3 times a day to help decrease swelling and soothe your oral tissues.  If you develop any fevers, increased welling, or redness to your uvula call the clinic and I will be happy to call in a prescription for antibiotics.  If you develop any trouble swallowing your secretions or difficulty breathing you need to go to the ER for evaluation.

## 2020-10-09 NOTE — ED Provider Notes (Signed)
[facility]    CSN: [id number] Arrival date & time: 10/09/20  1023      History   Chief Complaint Chief Complaint  Patient presents with  . uvula swelling    HPI Alicia Blair is a 45 y.o. female.   HPI   45 year old female here for evaluation of swollen painful uvula.  Patient reports that she has been experiencing a swollen and painful uvula for the last 3 days.  She states that she has had this in the past and has had to have antibiotic from time to time but is never had to have it drained.  Patient reports that one of her triggers is snoring and she knows that 3 days ago she snored really hard which triggered the pain.  Patient reports that she took some ibuprofen and Zyrtec today but it has not helped a lot.  She denies fever, trouble breathing, or trouble swallowing her secretions.  Past Medical History:  Diagnosis Date  . Anemia    with pregnancy  . Asthma    as a child  . Chicken pox   . Complication of anesthesia   . PONV (postoperative nausea and vomiting)     Patient Active Problem List   Diagnosis Date Noted  . Iron deficiency anemia 09/13/2020  . Encounter for preventive health examination 05/31/2020  . Bilateral lower extremity edema 10/24/2018  . B12 deficiency 04/07/2017  . Screening for cervical cancer 02/12/2015  . Needlestick injury accident with exposure to body fluid 01/18/2014  . Menorrhagia 01/18/2014  . History of motion sickness 07/12/2013  . Screening for lipoid disorders 06/01/2012  . Screening for diabetes mellitus 06/01/2012  . Skin lesion of face 06/01/2012  . Obesity due to excess calories without serious comorbidity 06/01/2012  . Psoriasis 05/27/2012    Past Surgical History:  Procedure Laterality Date  . breast augmentation  2003  . HERNIA REPAIR    . LAPAROSCOPIC GASTRIC BANDING  2008  . WISDOM TOOTH EXTRACTION      OB History    Gravida  2   Para  2   Term  2   Preterm      AB      Living  2      SAB      IAB      Ectopic      Multiple  0   Live Births  1            Home Medications    Prior to Admission medications   Medication Sig Start Date End Date Taking? Authorizing Provider  Cholecalciferol (D3-1000 PO) Take by mouth.   Yes [provider]  cyanocobalamin (,VITAMIN B-12,) 1000 MCG/ML injection Inject 1 mL (1,000 mcg total) into the muscle once a week. For 4 weeks,  Then monthly thereafter 04/28/20  Yes Crecencio Mc, MD  ferrous sulfate 325 (65 FE) MG tablet Take 325 mg by mouth daily with breakfast.   Yes [provider]  hydrochlorothiazide (HYDRODIURIL) 25 MG tablet Take 25 mg by mouth daily.   Yes [provider]  methylPREDNISolone (MEDROL DOSEPAK) 4 MG TBPK tablet Take according to the package insert. 10/09/20  Yes Margarette Canada, NP  Multiple Vitamin (MULTIVITAMIN) tablet Take 2 tablets by mouth daily.   Yes [provider]  clobetasol cream (TEMOVATE) 4.09 % Apply 1 application topically as needed.    [provider]  Syringe/Needle, Disp, (SYRINGE 3CC/25GX1") 25G X 1" 3 ML MISC Use for b12  injections 04/24/19   Crecencio Mc, MD    Family History Family History  Problem Relation Age of Onset  . Diabetes Mother   . Cancer Mother 63       endometrial CA  . Atrial fibrillation Mother   . Hypertension Mother   . Hyperlipidemia Mother   . Diabetes Maternal Aunt   . Diabetes Maternal Grandmother   . Cancer Brother 21       testicular ca  . Diabetes Brother     Social History Social History   Tobacco Use  . Smoking status: Never Smoker  . Smokeless tobacco: Never Used  Vaping Use  . Vaping Use: Never used  Substance Use Topics  . Alcohol use: Yes    Alcohol/week: 3.0 standard drinks    Types: 3 Cans of beer per week    Comment: social, none since pregnanc y  . Drug use: No     Allergies   Codeine and Erythromycin   Review of Systems Review of Systems  Constitutional: Negative for  activity change, appetite change and fever.  HENT: Positive for sore throat and trouble swallowing. Negative for congestion, ear pain and rhinorrhea.   Respiratory: Negative for shortness of breath and stridor.   Hematological: Negative.   Psychiatric/Behavioral: Negative.      Physical Exam Triage Vital Signs ED Triage Vitals  Enc Vitals Group     BP 10/09/20 1038 129/77     Pulse Rate 10/09/20 1038 86     Resp 10/09/20 1038 18     Temp 10/09/20 1038 98.2 F (36.8 C)     Temp Source 10/09/20 1038 Oral     SpO2 10/09/20 1038 100 %     Weight 10/09/20 1036 204 lb (92.5 kg)     Height 10/09/20 1036 5' 7"  (1.702 m)     Head Circumference --      Peak Flow --      Pain Score 10/09/20 1036 0     Pain Loc --      Pain Edu? --      Excl. in [place]? --    No data found.  Updated Vital Signs BP 129/77 (BP Location: Left Arm)   Pulse 86   Temp 98.2 F (36.8 C) (Oral)   Resp 18   Ht 5' 7"  (1.702 m)   Wt 204 lb (92.5 kg)   LMP 09/25/2020 (Approximate)   SpO2 100%   BMI 31.95 kg/m   Visual Acuity Right Eye Distance:   Left Eye Distance:   Bilateral Distance:    Right Eye Near:   Left Eye Near:    Bilateral Near:     Physical Exam Vitals and nursing note reviewed.  Constitutional:      General: She is not in acute distress.    Appearance: Normal appearance. She is not ill-appearing.  HENT:     Head: Normocephalic and atraumatic.     Mouth/Throat:     Mouth: Mucous membranes are moist.     Pharynx: Oropharynx is clear. No posterior oropharyngeal erythema.  Cardiovascular:     Rate and Rhythm: Normal rate and regular rhythm.     Pulses: Normal pulses.     Heart sounds: Normal heart sounds. No murmur heard. No gallop.   Pulmonary:     Effort: Pulmonary effort is normal.     Breath sounds: Normal breath sounds. No stridor. No wheezing, rhonchi or rales.  Skin:    General: Skin is warm and dry.  Capillary Refill: Capillary refill takes less than 2 seconds.      Findings: No erythema.  Neurological:     General: No focal deficit present.     Mental Status: She is alert and oriented to person, place, and time.  Psychiatric:        Mood and Affect: Mood normal.        Behavior: Behavior normal.        Thought Content: Thought content normal.        Judgment: Judgment normal.      UC Treatments / Results  Labs (all labs ordered are listed, but only abnormal results are displayed) Labs Reviewed - No data to display  EKG   Radiology No results found.  Procedures Procedures (including critical care time)  Medications Ordered in UC Medications - No data to display  Initial Impression / Assessment and Plan / UC Course  I have reviewed the triage vital signs and the nursing notes.  Pertinent labs & imaging results that were available during my care of the patient were reviewed by me and considered in my medical decision making (see chart for details).   Patient is a very pleasant 46 year old female here for evaluation of painful swollen uvula.  She states she has had symptoms for the past 3 days and has a history of uvulitis.  She has been on antibiotics for some previous cases but indicates she is never had to have her uvula drained.  Patient has taken ibuprofen and Zyrtec to help with her symptoms but it has not helped.  Patient reports that the pain is usually worse in the morning, goes down during the day, and then gets worse again at night.  Physical exam reveals a benign oral mucosa that is pink and moist.  The uvula is midline, nontender, not erythematous, and not edematous.  Patient has a patent oropharynx.  There is no exudate or erythema and her tonsils are surgically absent.  No cervical lymphadenopathy appreciated.  No stridor when auscultating over the trachea and lungs are clear to auscultation all fields.  Due to the lack of erythema or edema of the uvula I do not feel that antibiotics are warranted at this time.  We will treat  patient with a Medrol Dosepak to help with pain and swelling.  Patient advised that if she develops any redness, swelling of her uvula, or fever she needs to call the clinic and I will start her on some antibiotics.  Labs any difficulty breathing or swallowing her secretions she needs to go to the ER for evaluation.   Final Clinical Impressions(s) / UC Diagnoses   Final diagnoses:  Uvulitis     Discharge Instructions     Take the Medrol Dosepak according to the package instructions.  You can start today and take your breakfast and lunch dose together to get you back on schedule.  Continue to take your Zyrtec as there may be an allergic component to the pain and swelling of your uvula.  Gargle with warm salt water 2-3 times a day to help decrease swelling and soothe your oral tissues.  If you develop any fevers, increased welling, or redness to your uvula call the clinic and I will be happy to call in a prescription for antibiotics.  If you develop any trouble swallowing your secretions or difficulty breathing you need to go to the ER for evaluation.    ED Prescriptions    Medication Sig Dispense Auth. Provider   methylPREDNISolone (  MEDROL DOSEPAK) 4 MG TBPK tablet Take according to the package insert. 1 each Margarette Canada, NP     PDMP not reviewed this encounter.   Margarette Canada, NP 10/09/20 1108

## 2020-10-13 DIAGNOSIS — H43811 Vitreous degeneration, right eye: Secondary | ICD-10-CM | POA: Diagnosis not present

## 2020-10-26 ENCOUNTER — Other Ambulatory Visit: Payer: Self-pay

## 2020-10-26 ENCOUNTER — Other Ambulatory Visit (INDEPENDENT_AMBULATORY_CARE_PROVIDER_SITE_OTHER): Payer: 59

## 2020-10-26 DIAGNOSIS — R7303 Prediabetes: Secondary | ICD-10-CM | POA: Diagnosis not present

## 2020-10-26 DIAGNOSIS — R609 Edema, unspecified: Secondary | ICD-10-CM

## 2020-10-26 DIAGNOSIS — E611 Iron deficiency: Secondary | ICD-10-CM | POA: Diagnosis not present

## 2020-10-26 DIAGNOSIS — E538 Deficiency of other specified B group vitamins: Secondary | ICD-10-CM | POA: Diagnosis not present

## 2020-10-26 DIAGNOSIS — E559 Vitamin D deficiency, unspecified: Secondary | ICD-10-CM

## 2020-10-26 LAB — BASIC METABOLIC PANEL
BUN: 8 mg/dL (ref 6–23)
CO2: 27 mEq/L (ref 19–32)
Calcium: 9.2 mg/dL (ref 8.4–10.5)
Chloride: 105 mEq/L (ref 96–112)
Creatinine, Ser: 0.56 mg/dL (ref 0.40–1.20)
GFR: 110.68 mL/min (ref >60.00)
Glucose, Bld: 96 mg/dL (ref 70–99)
Potassium: 4 mEq/L (ref 3.5–5.1)
Sodium: 140 mEq/L (ref 135–145)

## 2020-10-26 LAB — VITAMIN B12: Vitamin B-12: 342 pg/mL (ref 211–911)

## 2020-10-26 LAB — IBC + FERRITIN
Ferritin: 3.2 ng/mL — ABNORMAL LOW (ref 10.0–291.0)
Iron: 34 ug/dL — ABNORMAL LOW (ref 42–145)
Saturation Ratios: 8.2 % — ABNORMAL LOW (ref 20.0–50.0)
Transferrin: 295 mg/dL (ref 212.0–360.0)

## 2020-10-26 LAB — HEMOGLOBIN A1C: Hgb A1c MFr Bld: 6 % (ref 4.6–6.5)

## 2020-10-26 LAB — VITAMIN D 25 HYDROXY (VIT D DEFICIENCY, FRACTURES): VITD: 33.75 ng/mL (ref 30.00–100.00)

## 2020-10-31 ENCOUNTER — Other Ambulatory Visit: Payer: Self-pay

## 2020-10-31 DIAGNOSIS — H52223 Regular astigmatism, bilateral: Secondary | ICD-10-CM | POA: Diagnosis not present

## 2020-11-02 ENCOUNTER — Other Ambulatory Visit: Payer: Self-pay

## 2020-11-02 MED ORDER — CLOBETASOL PROPIONATE 0.05 % EX CREA
TOPICAL_CREAM | CUTANEOUS | 0 refills | Status: DC
  Filled 2020-11-02: qty 45, 20d supply, fill #0

## 2020-11-02 MED ORDER — CLOBETASOL PROPIONATE 0.05 % EX SOLN
CUTANEOUS | 0 refills | Status: DC
  Filled 2020-11-02: qty 50, 20d supply, fill #0

## 2020-11-03 ENCOUNTER — Other Ambulatory Visit: Payer: Self-pay

## 2020-11-10 ENCOUNTER — Ambulatory Visit: Payer: 59 | Admitting: Internal Medicine

## 2020-11-15 ENCOUNTER — Other Ambulatory Visit: Payer: Self-pay | Admitting: Internal Medicine

## 2020-12-22 ENCOUNTER — Ambulatory Visit: Payer: 59 | Admitting: Internal Medicine

## 2021-02-16 DIAGNOSIS — Z7689 Persons encountering health services in other specified circumstances: Secondary | ICD-10-CM | POA: Diagnosis not present

## 2021-02-16 DIAGNOSIS — Z6833 Body mass index (BMI) 33.0-33.9, adult: Secondary | ICD-10-CM | POA: Diagnosis not present

## 2021-02-16 DIAGNOSIS — Z01419 Encounter for gynecological examination (general) (routine) without abnormal findings: Secondary | ICD-10-CM | POA: Diagnosis not present

## 2021-02-23 ENCOUNTER — Other Ambulatory Visit: Payer: Self-pay

## 2021-02-23 ENCOUNTER — Telehealth: Payer: Self-pay | Admitting: Internal Medicine

## 2021-02-23 ENCOUNTER — Ambulatory Visit: Payer: 59 | Admitting: Internal Medicine

## 2021-02-23 ENCOUNTER — Encounter: Payer: Self-pay | Admitting: Internal Medicine

## 2021-02-23 VITALS — BP 118/82 | HR 91 | Temp 97.7°F | Ht 67.0 in | Wt 209.2 lb

## 2021-02-23 DIAGNOSIS — E538 Deficiency of other specified B group vitamins: Secondary | ICD-10-CM

## 2021-02-23 DIAGNOSIS — R600 Localized edema: Secondary | ICD-10-CM

## 2021-02-23 DIAGNOSIS — Z9884 Bariatric surgery status: Secondary | ICD-10-CM | POA: Insufficient documentation

## 2021-02-23 DIAGNOSIS — R609 Edema, unspecified: Secondary | ICD-10-CM

## 2021-02-23 DIAGNOSIS — E6609 Other obesity due to excess calories: Secondary | ICD-10-CM

## 2021-02-23 DIAGNOSIS — Z6832 Body mass index (BMI) 32.0-32.9, adult: Secondary | ICD-10-CM

## 2021-02-23 LAB — BASIC METABOLIC PANEL
BUN: 11 mg/dL (ref 6–23)
CO2: 26 mEq/L (ref 19–32)
Calcium: 9.4 mg/dL (ref 8.4–10.5)
Chloride: 105 mEq/L (ref 96–112)
Creatinine, Ser: 0.61 mg/dL (ref 0.40–1.20)
GFR: 108.18 mL/min (ref >60.00)
Glucose, Bld: 87 mg/dL (ref 70–99)
Potassium: 4.1 mEq/L (ref 3.5–5.1)
Sodium: 139 mEq/L (ref 135–145)

## 2021-02-23 LAB — VITAMIN B12: Vitamin B-12: 1363 pg/mL — ABNORMAL HIGH (ref 211–911)

## 2021-02-23 LAB — TSH: TSH: 3.33 u[IU]/mL (ref 0.35–5.50)

## 2021-02-23 LAB — HM PAP SMEAR: HM Pap smear: NEGATIVE

## 2021-02-23 MED ORDER — FUROSEMIDE 20 MG PO TABS: 20.0000 mg | ORAL_TABLET | Freq: Every day | ORAL | 3 refills | Status: DC

## 2021-02-23 NOTE — Assessment & Plan Note (Signed)
Had lap badn removed in June due to symptoms  inflm

## 2021-02-23 NOTE — Assessment & Plan Note (Signed)
Her diet is fairly stringent but has gained weight since her post surgical nadir  . Encouraged to resume daily exercise in some form that she enjoys, both to help her achieve her goals and to provide work/life balance

## 2021-02-23 NOTE — Telephone Encounter (Signed)
Spoke with pt to let her know that labs were not needed in two weeks. Pt stated that you were putting her on lasix and because her potassium has ran low before you wanted to keep an eye on it.

## 2021-02-23 NOTE — Progress Notes (Signed)
Subjective:  Patient ID: Alicia Blair, female    DOB: May 17, 1976  Age: 45 y.o. MRN: [id number]  CC: The primary encounter diagnosis was Bilateral edema of lower extremity. Diagnoses of Class 1 obesity due to excess calories without serious comorbidity with body mass index (BMI) of 32.0 to 32.9 in adult, S/P bariatric surgery, Bilateral lower extremity edema, B12 deficiency, and Edema, unspecified type were also pertinent to this visit.  HPI Alicia Blair presents for  Chief Complaint  Patient presents with   Edema    Feet and ankles    3 month history of foot and ankle edema  Has been walking/exercising for the last 8 weeks.  8 to 12K steps daily  limiting salt. Wearing knee high compression stockings but by 9 am she has elephant feet .  No calf pain or shortness of breath.  No orthopnea.    Snores and wakes herself up,  but home BP readings are normal and she does not want a sleep study until she has vasular evaluation .   Lap band removed in June.   Anemia:  iron and b12 still low,. Periods lasting 4 heavy tampon changes )tampn changes every hour)   Diagnosed with IDA by GYN last week.  Iron prescribed .  Also has low b12 but not due to lap band .  Last injection was yesterday.     Outpatient Medications Prior to Visit  Medication Sig Dispense Refill   Cholecalciferol (D3-1000 PO) Take by mouth.     clobetasol (TEMOVATE) 0.05 % external solution Apply topically twice daily to psoriasis on scalp as needed 50 mL 0   clobetasol cream (TEMOVATE) 0.05 % Apply topically twice daily to rash until clear 45 g 0   cyanocobalamin (,VITAMIN B-12,) 1000 MCG/ML injection INJECT 1 ML (1,000 MCG TOTAL) INTO THE MUSCLE ONCE A WEEK. FOR 4 WEEKS, THEN MONTHLY THEREAFTER 4 mL 2   ferrous gluconate (FERGON) 240 (27 FE) MG tablet Take 240 mg by mouth 3 (three) times daily with meals.     hydrochlorothiazide (HYDRODIURIL) 25 MG tablet Take 25 mg by mouth daily.     methylPREDNISolone (MEDROL  DOSEPAK) 4 MG TBPK tablet Take according to the package insert. 1 each 0   Multiple Vitamin (MULTIVITAMIN) tablet Take 2 tablets by mouth daily.     Syringe/Needle, Disp, (SYRINGE 3CC/25GX1") 25G X 1" 3 ML MISC Use for b12 injections 50 each 0   clobetasol cream (TEMOVATE) 6.64 % Apply 1 application topically as needed.     No facility-administered medications prior to visit.    Review of Systems;  Patient denies headache, fevers, malaise, unintentional weight loss, skin rash, eye pain, sinus congestion and sinus pain, sore throat, dysphagia,  hemoptysis , cough, dyspnea, wheezing, chest pain, palpitations, orthopnea, edema, abdominal pain, nausea, melena, diarrhea, constipation, flank pain, dysuria, hematuria, urinary  Frequency, nocturia, numbness, tingling, seizures,  Focal weakness, Loss of consciousness,  Tremor, insomnia, depression, anxiety, and suicidal ideation.      Objective:  BP 118/82 (BP Location: Left Arm, Patient Position: Sitting, Cuff Size: Normal)   Pulse 91   Temp 97.7 F (36.5 C) (Oral)   Ht 5' 7"  (1.702 m)   Wt 209 lb 3.2 oz (94.9 kg)   SpO2 99%   BMI 32.77 kg/m   BP Readings from Last 3 Encounters:  02/23/21 118/82  10/09/20 129/77  09/12/20 112/84    Wt Readings from Last 3 Encounters:  02/23/21 209 lb 3.2 oz (94.9 kg)  10/09/20 204 lb (92.5 kg)  09/12/20 212 lb (96.2 kg)    General appearance: alert, cooperative and appears stated age Ears: normal TM's and external ear canals both ears Throat: lips, mucosa, and tongue normal; teeth and gums normal Neck: no adenopathy, no carotid bruit, supple, symmetrical, trachea midline and thyroid not enlarged, symmetric, no tenderness/mass/nodules Back: symmetric, no curvature. ROM normal. No CVA tenderness. Lungs: clear to auscultation bilaterally Heart: regular rate and rhythm, S1, S2 normal, no murmur, click, rub or gallop Abdomen: soft, non-tender; bowel sounds normal; no masses,  no organomegaly Pulses:  2+ and symmetric Skin: Skin color, texture, turgor normal. No rashes or lesions Lymph nodes: Cervical, supraclavicular, and axillary nodes normal.  Lab Results  Component Value Date   HGBA1C 6.0 10/26/2020   HGBA1C 6.1 04/27/2020   HGBA1C 5.7 04/22/2019    Lab Results  Component Value Date   CREATININE 0.61 02/23/2021   CREATININE 0.56 10/26/2020   CREATININE 0.74 08/24/2020    Lab Results  Component Value Date   WBC 7.1 08/24/2020   HGB 11.6 (L) 08/24/2020   HCT 35.1 (L) 08/24/2020   PLT 289.0 08/24/2020   GLUCOSE 87 02/23/2021   CHOL 194 04/27/2020   TRIG 57.0 04/27/2020   HDL 66.80 04/27/2020   LDLCALC 116 (H) 04/27/2020   ALT 9 08/24/2020   AST 12 08/24/2020   NA 139 02/23/2021   K 4.1 02/23/2021   CL 105 02/23/2021   CREATININE 0.61 02/23/2021   BUN 11 02/23/2021   CO2 26 02/23/2021   TSH 3.33 02/23/2021   HGBA1C 6.0 10/26/2020   MICROALBUR <0.7 09/12/2020    No results found.  Assessment & Plan:   Problem List Items Addressed This Visit       Unprioritized   Obesity due to excess calories without serious comorbidity    Her diet is fairly stringent but has gained weight since her post surgical nadir  . Encouraged to resume daily exercise in some form that she enjoys, both to help her achieve her goals and to provide work/life balance      B12 deficiency   Relevant Orders   Intrinsic Factor Antibodies   Vitamin B12 (Completed)   Bilateral lower extremity edema    She descibes the development of "elephant ankles" daily in the afternoons despite use of compression stockings. I suspect she has lymphedema.  Referring to [facility] for evaluation .  If there is no lymphedema or venous insufficiency,  Will evaluate for OSA      S/P bariatric surgery    Had lap badn removed in June due to symptoms  inflm      Other Visit Diagnoses     Bilateral edema of lower extremity    -  Primary   Relevant Orders   Ambulatory referral to Vascular Surgery   Edema,  unspecified type       Relevant Orders   TSH (Completed)   Basic metabolic panel (Completed)       I am having [facility] start on furosemide. I am also having her maintain her multivitamin, SYRINGE 3CC/25GX1", hydrochlorothiazide, Cholecalciferol (D3-1000 PO), methylPREDNISolone, ferrous gluconate, clobetasol cream, clobetasol, and cyanocobalamin.  Meds ordered this encounter  Medications   furosemide (LASIX) 20 MG tablet    Sig: Take 1 tablet (20 mg total) by mouth daily.    Dispense:  30 tablet    Refill:  3    Medications Discontinued During This Encounter  Medication Reason   clobetasol cream (TEMOVATE)  8.88 % Duplicate    Follow-up: No follow-ups on file.   Crecencio Mc, MD

## 2021-02-23 NOTE — Telephone Encounter (Signed)
Patient stating when checking out that she was supposed to come back in 2 weeks for labs.   No orders placed and no check out note.   Please advise

## 2021-02-23 NOTE — Patient Instructions (Signed)
Lasix every other day for starters  Weigh daily and record weights to monitor volume status   If Dr Lucky Cowboy says veins are fine, sleep study recommended   Contineu montly b12 for now

## 2021-02-23 NOTE — Telephone Encounter (Signed)
My apologies.   I have ordered the bmet for 2 weeks

## 2021-02-24 NOTE — Telephone Encounter (Signed)
Spoke with pt and scheduled her for a lab appt in 2 weeks.

## 2021-02-25 NOTE — Assessment & Plan Note (Addendum)
She descibes the development of "elephant ankles" daily in the afternoons despite use of compression stockings. I suspect she has lymphedema.  Referring to [facility] for evaluation .  If there is no lymphedema or venous insufficiency,  Will evaluate for OSA

## 2021-02-27 LAB — INTRINSIC FACTOR ANTIBODIES: Intrinsic Factor: NEGATIVE

## 2021-03-09 ENCOUNTER — Other Ambulatory Visit: Payer: 59

## 2021-03-14 ENCOUNTER — Other Ambulatory Visit: Payer: Self-pay

## 2021-03-14 ENCOUNTER — Ambulatory Visit (INDEPENDENT_AMBULATORY_CARE_PROVIDER_SITE_OTHER): Payer: 59 | Admitting: Vascular Surgery

## 2021-03-14 ENCOUNTER — Encounter (INDEPENDENT_AMBULATORY_CARE_PROVIDER_SITE_OTHER): Payer: 59 | Admitting: Vascular Surgery

## 2021-03-14 ENCOUNTER — Encounter (INDEPENDENT_AMBULATORY_CARE_PROVIDER_SITE_OTHER): Payer: Self-pay | Admitting: Vascular Surgery

## 2021-03-14 DIAGNOSIS — M7989 Other specified soft tissue disorders: Secondary | ICD-10-CM | POA: Insufficient documentation

## 2021-03-14 NOTE — Progress Notes (Signed)
Patient ID: Alicia Blair, female   DOB: 1976-04-14, 45 y.o.   MRN: [id number]  Chief Complaint  Patient presents with   New Patient (Initial Visit)    Ref Derrel Nip bilateral edema lower extremity    HPI Alicia Blair is a 45 y.o. female.  I am asked to see the patient by Dr. Derrel Nip for evaluation of leg swelling.  The patient is noticed over the last 3 to 4 months that her leg swelling is gotten dramatically worse.  She has a job where she does have to sit for a long time but she has had that for many years.  She has been trying to get out more and walk.  She began wearing compression stockings a few months ago but she is still swelling despite this.  She did have some edema with her most recent pregnancy which was about 4 to 5 years ago.  The right leg may swell a little more than the left, but they both have swelling.  Her primary care physician gave her some Lasix but she only took a few pills and it did not make any difference.  Her primary care physician also recommended she be evaluated for lymphedema at this point and referred her to our office.  No previous history of trauma or injury to the legs.  No previous history of DVT or superficial thrombophlebitis.     Past Medical History:  Diagnosis Date   Anemia    with pregnancy   Asthma    as a child   Chicken pox    Complication of anesthesia    PONV (postoperative nausea and vomiting)     Past Surgical History:  Procedure Laterality Date   breast augmentation  2003   HERNIA REPAIR     LAPAROSCOPIC GASTRIC BANDING  2008   WISDOM TOOTH EXTRACTION       Family History  Problem Relation Age of Onset   Diabetes Mother    Cancer Mother 14       endometrial CA   Atrial fibrillation Mother    Hypertension Mother    Hyperlipidemia Mother    Diabetes Maternal Aunt    Diabetes Maternal Grandmother    Cancer Brother 24       testicular ca   Diabetes Brother       Social History   Tobacco Use   Smoking status: Never    Smokeless tobacco: Never  Vaping Use   Vaping Use: Never used  Substance Use Topics   Alcohol use: Yes    Alcohol/week: 3.0 standard drinks    Types: 3 Cans of beer per week    Comment: social, none since pregnanc y   Drug use: No     Allergies  Allergen Reactions   Codeine Nausea And Vomiting   Erythromycin Nausea And Vomiting    Current Outpatient Medications  Medication Sig Dispense Refill   Cholecalciferol (D3-1000 PO) Take by mouth.     clobetasol (TEMOVATE) 0.05 % external solution Apply topically twice daily to psoriasis on scalp as needed 50 mL 0   clobetasol cream (TEMOVATE) 0.05 % Apply topically twice daily to rash until clear 45 g 0   cyanocobalamin (,VITAMIN B-12,) 1000 MCG/ML injection INJECT 1 ML (1,000 MCG TOTAL) INTO THE MUSCLE ONCE A WEEK. FOR 4 WEEKS, THEN MONTHLY THEREAFTER 4 mL 2   ferrous sulfate 325 (65 FE) MG tablet Take 325 mg by mouth daily.     furosemide (LASIX) 20 MG  tablet Take 1 tablet (20 mg total) by mouth daily. 30 tablet 3   hydrochlorothiazide (HYDRODIURIL) 25 MG tablet Take 25 mg by mouth daily.     Multiple Vitamin (MULTIVITAMIN) tablet Take 2 tablets by mouth daily.     Syringe/Needle, Disp, (SYRINGE 3CC/25GX1") 25G X 1" 3 ML MISC Use for b12 injections 50 each 0   ferrous gluconate (FERGON) 240 (27 FE) MG tablet Take 240 mg by mouth 3 (three) times daily with meals. (Patient not taking: Reported on 03/14/2021)     methylPREDNISolone (MEDROL DOSEPAK) 4 MG TBPK tablet Take according to the package insert. (Patient not taking: Reported on 03/14/2021) 1 each 0   No current facility-administered medications for this visit.      REVIEW OF SYSTEMS (Negative unless checked)  Constitutional: [] Weight loss  [] Fever  [] Chills Cardiac: [] Chest pain   [] Chest pressure   [] Palpitations   [] Shortness of breath when laying flat   [] Shortness of breath at rest   [] Shortness of breath with exertion. Vascular:  [] Pain in legs with walking   [] Pain in  legs at rest   [] Pain in legs when laying flat   [] Claudication   [] Pain in feet when walking  [] Pain in feet at rest  [] Pain in feet when laying flat   [] History of DVT   [] Phlebitis   [x] Swelling in legs   [] Varicose veins   [] Non-healing ulcers Pulmonary:   [] Uses home oxygen   [] Productive cough   [] Hemoptysis   [] Wheeze  [] COPD   [] Asthma Neurologic:  [] Dizziness  [] Blackouts   [] Seizures   [] History of stroke   [] History of TIA  [] Aphasia   [] Temporary blindness   [] Dysphagia   [] Weakness or numbness in arms   [] Weakness or numbness in legs Musculoskeletal:  [] Arthritis   [] Joint swelling   [] Joint pain   [] Low back pain Hematologic:  [] Easy bruising  [] Easy bleeding   [] Hypercoagulable state   [x] Anemic  [] Hepatitis Gastrointestinal:  [] Blood in stool   [] Vomiting blood  [] Gastroesophageal reflux/heartburn   [] Abdominal pain Genitourinary:  [] Chronic kidney disease   [] Difficult urination  [] Frequent urination  [] Burning with urination   [] Hematuria Skin:  [] Rashes   [] Ulcers   [] Wounds Psychological:  [] History of anxiety   []  History of major depression.    Physical Exam BP 133/84 (BP Location: Right Arm)   Pulse 83   Resp 16   Ht 5' 7"  (1.702 m)   Wt 209 lb 12.8 oz (95.2 kg)   BMI 32.86 kg/m  Gen:  WD/WN, NAD Head: [place]/AT, No temporalis wasting.  Ear/Nose/Throat: Hearing grossly intact, nares w/o erythema or drainage, oropharynx w/o Erythema/Exudate Eyes: Conjunctiva clear, sclera non-icteric  Neck: trachea midline.  No JVD.  Pulmonary:  Good air movement, respirations not labored, no use of accessory muscles  Cardiac: RRR, no JVD Vascular:  Vessel Right Left  Radial Palpable Palpable                          DP 2+ 2+  PT 1+ 1+    Musculoskeletal: M/S 5/5 throughout.  Extremities without ischemic changes.  No deformity or atrophy. 1+ BLE edema. Neurologic: Sensation grossly intact in extremities.  Symmetrical.  Speech is fluent. Motor exam as listed  above. Psychiatric: Judgment intact, Mood & affect appropriate for pt's clinical situation. Dermatologic: No rashes or ulcers noted.  No cellulitis or open wounds.    Radiology No results found.  Labs Recent Results (from the  past 2160 hour(s))  Intrinsic Factor Antibodies     Status: None   Collection Time: 02/23/21 12:10 PM  Result Value Ref Range   Intrinsic Factor Negative Negative    Comment: . For additional information, please refer to http://education.[facility].com/faq/IFAB (This link is being provided for informational/  educational purposes only.) .   TSH     Status: None   Collection Time: 02/23/21 12:21 PM  Result Value Ref Range   TSH 3.33 0.35 - 5.50 uIU/mL  Basic metabolic panel     Status: None   Collection Time: 02/23/21 12:21 PM  Result Value Ref Range   Sodium 139 135 - 145 mEq/L   Potassium 4.1 3.5 - 5.1 mEq/L   Chloride 105 96 - 112 mEq/L   CO2 26 19 - 32 mEq/L   Glucose, Bld 87 70 - 99 mg/dL   BUN 11 6 - 23 mg/dL   Creatinine, Ser 0.61 0.40 - 1.20 mg/dL   GFR 108.18 >60.00 mL/min    Comment: Calculated using the CKD-EPI Creatinine Equation (2021)   Calcium 9.4 8.4 - 10.5 mg/dL  Vitamin B12     Status: Abnormal   Collection Time: 02/23/21 12:21 PM  Result Value Ref Range   Vitamin B-12 1,363 (H) 211 - 911 pg/mL    Assessment/Plan:  Swelling of limb I have had a long discussion with the patient regarding swelling and why it  causes symptoms.  Patient has already been wearing graduated compression stockings class 1 (20-30 mmHg) on a daily basis. The patient will  beginning wearing the stockings first thing in the morning and removing them in the evening. The patient is instructed specifically not to sleep in the stockings.   In addition, behavioral modification will be initiated.  This will include frequent elevation, use of over the counter pain medications and exercise such as walking.  I have reviewed systemic causes for chronic edema  such as liver, kidney and cardiac etiologies.  The patient denies problems with these organ systems.    Consideration for a lymph pump will also be made based upon the effectiveness of conservative therapy.  This would help to improve the edema control and prevent sequela such as ulcers and infections   Patient should undergo duplex ultrasound of the venous system to ensure that DVT or reflux is not present.  The patient will follow-up with me after the ultrasound.       Leotis Pain 03/14/2021, 11:57 AM   This note was created with Dragon medical transcription system.  Any errors from dictation are unintentional.

## 2021-03-14 NOTE — Assessment & Plan Note (Signed)
I have had a long discussion with the patient regarding swelling and why it  causes symptoms.  Patient has already been wearing graduated compression stockings class 1 (20-30 mmHg) on a daily basis. The patient will  beginning wearing the stockings first thing in the morning and removing them in the evening. The patient is instructed specifically not to sleep in the stockings.   In addition, behavioral modification will be initiated.  This will include frequent elevation, use of over the counter pain medications and exercise such as walking.  I have reviewed systemic causes for chronic edema such as liver, kidney and cardiac etiologies.  The patient denies problems with these organ systems.    Consideration for a lymph pump will also be made based upon the effectiveness of conservative therapy.  This would help to improve the edema control and prevent sequela such as ulcers and infections   Patient should undergo duplex ultrasound of the venous system to ensure that DVT or reflux is not present.  The patient will follow-up with me after the ultrasound.

## 2021-03-17 DIAGNOSIS — L400 Psoriasis vulgaris: Secondary | ICD-10-CM | POA: Diagnosis not present

## 2021-03-17 DIAGNOSIS — D225 Melanocytic nevi of trunk: Secondary | ICD-10-CM | POA: Diagnosis not present

## 2021-03-17 DIAGNOSIS — L538 Other specified erythematous conditions: Secondary | ICD-10-CM | POA: Diagnosis not present

## 2021-03-17 DIAGNOSIS — D235 Other benign neoplasm of skin of trunk: Secondary | ICD-10-CM | POA: Diagnosis not present

## 2021-03-17 DIAGNOSIS — L853 Xerosis cutis: Secondary | ICD-10-CM | POA: Diagnosis not present

## 2021-03-17 DIAGNOSIS — D2262 Melanocytic nevi of left upper limb, including shoulder: Secondary | ICD-10-CM | POA: Diagnosis not present

## 2021-03-17 DIAGNOSIS — D2261 Melanocytic nevi of right upper limb, including shoulder: Secondary | ICD-10-CM | POA: Diagnosis not present

## 2021-03-17 DIAGNOSIS — R208 Other disturbances of skin sensation: Secondary | ICD-10-CM | POA: Diagnosis not present

## 2021-03-23 ENCOUNTER — Other Ambulatory Visit: Payer: 59

## 2021-03-28 DIAGNOSIS — N760 Acute vaginitis: Secondary | ICD-10-CM | POA: Diagnosis not present

## 2021-03-29 ENCOUNTER — Other Ambulatory Visit (INDEPENDENT_AMBULATORY_CARE_PROVIDER_SITE_OTHER): Payer: Self-pay | Admitting: Vascular Surgery

## 2021-03-29 DIAGNOSIS — I872 Venous insufficiency (chronic) (peripheral): Secondary | ICD-10-CM

## 2021-03-31 ENCOUNTER — Other Ambulatory Visit: Payer: Self-pay

## 2021-03-31 ENCOUNTER — Ambulatory Visit (INDEPENDENT_AMBULATORY_CARE_PROVIDER_SITE_OTHER): Payer: 59

## 2021-03-31 ENCOUNTER — Encounter (INDEPENDENT_AMBULATORY_CARE_PROVIDER_SITE_OTHER): Payer: Self-pay | Admitting: Vascular Surgery

## 2021-03-31 ENCOUNTER — Ambulatory Visit (INDEPENDENT_AMBULATORY_CARE_PROVIDER_SITE_OTHER): Payer: 59 | Admitting: Vascular Surgery

## 2021-03-31 DIAGNOSIS — I872 Venous insufficiency (chronic) (peripheral): Secondary | ICD-10-CM | POA: Diagnosis not present

## 2021-03-31 DIAGNOSIS — I890 Lymphedema, not elsewhere classified: Secondary | ICD-10-CM | POA: Diagnosis not present

## 2021-03-31 NOTE — Assessment & Plan Note (Signed)
Her reflux study today shows no evidence of DVT or superficial thrombophlebitis.  No significant venous reflux is seen in either lower extremity.  She did have significant Baker cysts in both popliteal spaces. At this point, the patient appears to have stage II lymphedema with swelling refractory to compression and elevation.  In addition to these typical lifestyle behavior modifications, a lymphedema pump would be an excellent adjuvant therapy to improve her pain and swelling.  I will try to get that approved for her at this point.  She will remain active as well.  I will plan on seeing her back in 6 months.

## 2021-03-31 NOTE — Progress Notes (Signed)
MRN : [id number]  Alicia Blair is a 45 y.o. (1975/11/24) female who presents with chief complaint of  Chief Complaint  Patient presents with   Follow-up    Ultrasound follow up  .  History of Present Illness: Patient returns today in follow up of her leg swelling.  She has been diligently wearing her compression socks and elevating her legs.  There is still swelling although this may have made a slight improvement.  They are not very swollen today, but it is early in the day and she has not been on her feet much.  Her reflux study today shows no evidence of DVT or superficial thrombophlebitis.  No significant venous reflux is seen in either lower extremity.  She did have significant Baker cysts in both popliteal spaces.  Current Outpatient Medications  Medication Sig Dispense Refill   Cholecalciferol (D3-1000 PO) Take by mouth.     clobetasol (TEMOVATE) 0.05 % external solution Apply topically twice daily to psoriasis on scalp as needed 50 mL 0   clobetasol cream (TEMOVATE) 0.05 % Apply topically twice daily to rash until clear 45 g 0   cyanocobalamin (,VITAMIN B-12,) 1000 MCG/ML injection INJECT 1 ML (1,000 MCG TOTAL) INTO THE MUSCLE ONCE A WEEK. FOR 4 WEEKS, THEN MONTHLY THEREAFTER 4 mL 2   ferrous sulfate 325 (65 FE) MG tablet Take 325 mg by mouth daily.     hydrochlorothiazide (HYDRODIURIL) 25 MG tablet Take 25 mg by mouth daily.     Multiple Vitamin (MULTIVITAMIN) tablet Take 2 tablets by mouth daily.     Syringe/Needle, Disp, (SYRINGE 3CC/25GX1") 25G X 1" 3 ML MISC Use for b12 injections 50 each 0   ferrous gluconate (FERGON) 240 (27 FE) MG tablet Take 240 mg by mouth 3 (three) times daily with meals. (Patient not taking: No sig reported)     furosemide (LASIX) 20 MG tablet Take 1 tablet (20 mg total) by mouth daily. (Patient not taking: Reported on 03/31/2021) 30 tablet 3   methylPREDNISolone (MEDROL DOSEPAK) 4 MG TBPK tablet Take according to the package insert. (Patient not  taking: No sig reported) 1 each 0   No current facility-administered medications for this visit.    Past Medical History:  Diagnosis Date   Anemia    with pregnancy   Asthma    as a child   Chicken pox    Complication of anesthesia    PONV (postoperative nausea and vomiting)     Past Surgical History:  Procedure Laterality Date   breast augmentation  2003   HERNIA REPAIR     LAPAROSCOPIC GASTRIC BANDING  2008   WISDOM TOOTH EXTRACTION       Social History   Tobacco Use   Smoking status: Never   Smokeless tobacco: Never  Vaping Use   Vaping Use: Never used  Substance Use Topics   Alcohol use: Yes    Alcohol/week: 3.0 standard drinks    Types: 3 Cans of beer per week    Comment: social, none since pregnanc y   Drug use: No       Family History  Problem Relation Age of Onset   Diabetes Mother    Cancer Mother 70       endometrial CA   Atrial fibrillation Mother    Hypertension Mother    Hyperlipidemia Mother    Diabetes Maternal Aunt    Diabetes Maternal Grandmother    Cancer Brother 21       testicular  ca   Diabetes Brother      Allergies  Allergen Reactions   Codeine Nausea And Vomiting   Erythromycin Nausea And Vomiting     REVIEW OF SYSTEMS (Negative unless checked)  Constitutional: [] Weight loss  [] Fever  [] Chills Cardiac: [] Chest pain   [] Chest pressure   [] Palpitations   [] Shortness of breath when laying flat   [] Shortness of breath at rest   [] Shortness of breath with exertion. Vascular:  [] Pain in legs with walking   [] Pain in legs at rest   [] Pain in legs when laying flat   [] Claudication   [] Pain in feet when walking  [] Pain in feet at rest  [] Pain in feet when laying flat   [] History of DVT   [] Phlebitis   [x] Swelling in legs   [] Varicose veins   [] Non-healing ulcers Pulmonary:   [] Uses home oxygen   [] Productive cough   [] Hemoptysis   [] Wheeze  [] COPD   [] Asthma Neurologic:  [] Dizziness  [] Blackouts   [] Seizures   [] History of stroke    [] History of TIA  [] Aphasia   [] Temporary blindness   [] Dysphagia   [] Weakness or numbness in arms   [] Weakness or numbness in legs Musculoskeletal:  [] Arthritis   [] Joint swelling   [] Joint pain   [] Low back pain Hematologic:  [] Easy bruising  [] Easy bleeding   [] Hypercoagulable state   [] Anemic   Gastrointestinal:  [] Blood in stool   [] Vomiting blood  [] Gastroesophageal reflux/heartburn   [] Abdominal pain Genitourinary:  [] Chronic kidney disease   [] Difficult urination  [] Frequent urination  [] Burning with urination   [] Hematuria Skin:  [] Rashes   [] Ulcers   [] Wounds Psychological:  [] History of anxiety   []  History of major depression.  Physical Examination  BP 122/81 (BP Location: Left Arm)   Pulse 82   Resp 16   Wt 208 lb (94.3 kg)   BMI 32.58 kg/m  Gen:  WD/WN, NAD Head: [place]/AT, No temporalis wasting. Ear/Nose/Throat: Hearing grossly intact, nares w/o erythema or drainage Eyes: Conjunctiva clear. Sclera non-icteric Neck: Supple.  Trachea midline Pulmonary:  Good air movement, no use of accessory muscles.  Cardiac: RRR, no JVD Vascular:  Vessel Right Left  Radial Palpable Palpable                          PT Palpable Palpable  DP Palpable Palpable   Gastrointestinal: soft, non-tender/non-distended. No guarding/reflex.  Musculoskeletal: M/S 5/5 throughout.  No deformity or atrophy. Trace BLE edema. Neurologic: Sensation grossly intact in extremities.  Symmetrical.  Speech is fluent.  Psychiatric: Judgment intact, Mood & affect appropriate for pt's clinical situation. Dermatologic: No rashes or ulcers noted.  No cellulitis or open wounds.      Labs Recent Results (from the past 2160 hour(s))  Intrinsic Factor Antibodies     Status: None   Collection Time: 02/23/21 12:10 PM  Result Value Ref Range   Intrinsic Factor Negative Negative    Comment: . For additional information, please refer to http://education.[facility].com/faq/IFAB (This link is being  provided for informational/  educational purposes only.) .   TSH     Status: None   Collection Time: 02/23/21 12:21 PM  Result Value Ref Range   TSH 3.33 0.35 - 5.50 uIU/mL  Basic metabolic panel     Status: None   Collection Time: 02/23/21 12:21 PM  Result Value Ref Range   Sodium 139 135 - 145 mEq/L   Potassium 4.1 3.5 - 5.1 mEq/L   Chloride  105 96 - 112 mEq/L   CO2 26 19 - 32 mEq/L   Glucose, Bld 87 70 - 99 mg/dL   BUN 11 6 - 23 mg/dL   Creatinine, Ser 0.61 0.40 - 1.20 mg/dL   GFR 108.18 >60.00 mL/min    Comment: Calculated using the CKD-EPI Creatinine Equation (2021)   Calcium 9.4 8.4 - 10.5 mg/dL  Vitamin B12     Status: Abnormal   Collection Time: 02/23/21 12:21 PM  Result Value Ref Range   Vitamin B-12 1,363 (H) 211 - 911 pg/mL    Radiology No results found.  Assessment/Plan  Lymphedema Her reflux study today shows no evidence of DVT or superficial thrombophlebitis.  No significant venous reflux is seen in either lower extremity.  She did have significant Baker cysts in both popliteal spaces. At this point, the patient appears to have stage II lymphedema with swelling refractory to compression and elevation.  In addition to these typical lifestyle behavior modifications, a lymphedema pump would be an excellent adjuvant therapy to improve her pain and swelling.  I will try to get that approved for her at this point.  She will remain active as well.  I will plan on seeing her back in 6 months.    Leotis Pain, MD  03/31/2021 11:18 AM    This note was created with Dragon medical transcription system.  Any errors from dictation are purely unintentional

## 2021-04-07 DIAGNOSIS — L858 Other specified epidermal thickening: Secondary | ICD-10-CM | POA: Diagnosis not present

## 2021-04-07 DIAGNOSIS — D485 Neoplasm of uncertain behavior of skin: Secondary | ICD-10-CM | POA: Diagnosis not present

## 2021-04-14 DIAGNOSIS — N920 Excessive and frequent menstruation with regular cycle: Secondary | ICD-10-CM | POA: Diagnosis not present

## 2021-04-14 DIAGNOSIS — N760 Acute vaginitis: Secondary | ICD-10-CM | POA: Diagnosis not present

## 2021-04-17 ENCOUNTER — Ambulatory Visit
Admission: RE | Admit: 2021-04-17 | Discharge: 2021-04-17 | Disposition: A | Discharge status: Home / Self Care | Payer: 59 | Source: Ambulatory Visit | Attending: Emergency Medicine | Admitting: Emergency Medicine

## 2021-04-17 ENCOUNTER — Other Ambulatory Visit: Payer: Self-pay

## 2021-04-17 VITALS — BP 137/92 | HR 91 | Temp 98.7°F | Resp 16 | Ht 67.0 in | Wt 205.0 lb

## 2021-04-17 DIAGNOSIS — J029 Acute pharyngitis, unspecified: Secondary | ICD-10-CM | POA: Diagnosis not present

## 2021-04-17 MED ORDER — METHYLPREDNISOLONE 4 MG PO TBPK: ORAL_TABLET | ORAL | 0 refills | Status: DC

## 2021-04-17 NOTE — ED Triage Notes (Signed)
Pt here with C/O sore throat since Friday. Hurts to swallow. Usually seen here and DX with swollen uvula.

## 2021-04-17 NOTE — ED Provider Notes (Signed)
[facility]    CSN: [id number] Arrival date & time: 04/17/21  1001      History   Chief Complaint Chief Complaint  Patient presents with   Sore Throat    APPOINTMENT    HPI Alicia Blair is a 45 y.o. female.   HPI  45 year old female here for evaluation of sore throat.  Patient reports that she been experiencing a sore throat for the past 3 days that is causing her pain with swallowing.  She is concerned that it might be a recurrence of uvulitis as she gets that every 3 to 4 months.  She has not been evaluated by ENT for that in the past.  Her current symptoms are not associate with fever, runny nose or nasal congestion, ear pain, cough or shortness of breath, or GI symptoms.  Patient is able to speak in full sentences and she is not in any distress.  Past Medical History:  Diagnosis Date   Anemia    with pregnancy   Asthma    as a child   Chicken pox    Complication of anesthesia    PONV (postoperative nausea and vomiting)     Patient Active Problem List   Diagnosis Date Noted   Lymphedema 03/31/2021   Swelling of limb 03/14/2021   S/P bariatric surgery 02/23/2021   Iron deficiency anemia 09/13/2020   Encounter for preventive health examination 05/31/2020   Bilateral lower extremity edema 10/24/2018   B12 deficiency 04/07/2017   Screening for cervical cancer 02/12/2015   Needlestick injury accident with exposure to body fluid 01/18/2014   Menorrhagia 01/18/2014   History of motion sickness 07/12/2013   Screening for lipoid disorders 06/01/2012   Screening for diabetes mellitus 06/01/2012   Skin lesion of face 06/01/2012   Obesity due to excess calories without serious comorbidity 06/01/2012   Psoriasis 05/27/2012    Past Surgical History:  Procedure Laterality Date   breast augmentation  2003   HERNIA REPAIR     LAPAROSCOPIC GASTRIC BANDING  2008   WISDOM TOOTH EXTRACTION      OB History     Gravida  2   Para  2   Term  2    Preterm      AB      Living  2      SAB      IAB      Ectopic      Multiple  0   Live Births  1            Home Medications    Prior to Admission medications   Medication Sig Start Date End Date Taking? Authorizing Provider  Cholecalciferol (D3-1000 PO) Take by mouth.   Yes [provider]  cyanocobalamin (,VITAMIN B-12,) 1000 MCG/ML injection INJECT 1 ML (1,000 MCG TOTAL) INTO THE MUSCLE ONCE A WEEK. FOR 4 WEEKS, THEN MONTHLY THEREAFTER 11/15/20  Yes Crecencio Mc, MD  ferrous sulfate 325 (65 FE) MG tablet Take 325 mg by mouth daily. 02/22/21  Yes [provider]  Multiple Vitamin (MULTIVITAMIN) tablet Take 2 tablets by mouth daily.   Yes [provider]  methylPREDNISolone (MEDROL DOSEPAK) 4 MG TBPK tablet Take according to the package insert. 04/17/21   Margarette Canada, NP    Family History Family History  Problem Relation Age of Onset   Diabetes Mother    Cancer Mother 67       endometrial CA   Atrial fibrillation Mother  Hypertension Mother    Hyperlipidemia Mother    Diabetes Maternal Aunt    Diabetes Maternal Grandmother    Cancer Brother 46       testicular ca   Diabetes Brother     Social History Social History   Tobacco Use   Smoking status: Never   Smokeless tobacco: Never  Vaping Use   Vaping Use: Never used  Substance Use Topics   Alcohol use: Yes    Alcohol/week: 3.0 standard drinks    Types: 3 Cans of beer per week    Comment: social, none since pregnanc y   Drug use: No     Allergies   Codeine and Erythromycin   Review of Systems Review of Systems  Constitutional:  Negative for activity change, appetite change and fever.  HENT:  Positive for sore throat. Negative for congestion, ear pain and rhinorrhea.   Respiratory:  Negative for cough, shortness of breath, wheezing and stridor.   Gastrointestinal:  Negative for diarrhea, nausea and vomiting.  Skin:  Negative for rash.  Hematological: Negative.    Psychiatric/Behavioral: Negative.      Physical Exam Triage Vital Signs ED Triage Vitals  Enc Vitals Group     BP 04/17/21 1034 (!) 137/92     Pulse Rate 04/17/21 1034 91     Resp 04/17/21 1034 16     Temp 04/17/21 1034 98.7 F (37.1 C)     Temp Source 04/17/21 1034 Oral     SpO2 04/17/21 1034 100 %     Weight 04/17/21 1032 205 lb (93 kg)     Height 04/17/21 1032 5' 7"  (1.702 m)     Head Circumference --      Peak Flow --      Pain Score 04/17/21 1032 4     Pain Loc --      Pain Edu? --      Excl. in [place]? --    No data found.  Updated Vital Signs BP (!) 137/92 (BP Location: Left Arm)   Pulse 91   Temp 98.7 F (37.1 C) (Oral)   Resp 16   Ht 5' 7"  (1.702 m)   Wt 205 lb (93 kg)   LMP 04/06/2021   SpO2 100%   BMI 32.11 kg/m   Visual Acuity Right Eye Distance:   Left Eye Distance:   Bilateral Distance:    Right Eye Near:   Left Eye Near:    Bilateral Near:     Physical Exam Vitals and nursing note reviewed.  Constitutional:      General: She is not in acute distress.    Appearance: Normal appearance. She is not ill-appearing.  HENT:     Head: Normocephalic and atraumatic.     Right Ear: Tympanic membrane, ear canal and external ear normal. There is no impacted cerumen.     Left Ear: Tympanic membrane, ear canal and external ear normal. There is no impacted cerumen.     Nose: Nose normal. No congestion or rhinorrhea.     Mouth/Throat:     Mouth: Mucous membranes are moist.     Pharynx: Oropharynx is clear. No oropharyngeal exudate or posterior oropharyngeal erythema.  Cardiovascular:     Rate and Rhythm: Normal rate and regular rhythm.     Pulses: Normal pulses.     Heart sounds: Normal heart sounds. No murmur heard.   No gallop.  Pulmonary:     Effort: Pulmonary effort is normal.     Breath sounds:  Normal breath sounds. No stridor. No wheezing, rhonchi or rales.  Musculoskeletal:     Cervical back: Normal range of motion and neck supple. Tenderness  present.  Lymphadenopathy:     Cervical: No cervical adenopathy.  Skin:    General: Skin is warm and dry.     Capillary Refill: Capillary refill takes less than 2 seconds.     Findings: No erythema or rash.  Neurological:     General: No focal deficit present.     Mental Status: She is alert and oriented to person, place, and time.  Psychiatric:        Mood and Affect: Mood normal.        Behavior: Behavior normal.        Thought Content: Thought content normal.        Judgment: Judgment normal.     UC Treatments / Results  Labs (all labs ordered are listed, but only abnormal results are displayed) Labs Reviewed - No data to display  EKG   Radiology No results found.  Procedures Procedures (including critical care time)  Medications Ordered in UC Medications - No data to display  Initial Impression / Assessment and Plan / UC Course  I have reviewed the triage vital signs and the nursing notes.  Pertinent labs & imaging results that were available during my care of the patient were reviewed by me and considered in my medical decision making (see chart for details).  Patient is a very pleasant, nontoxic-appearing 45 year old female here for evaluation of sore throat that is been going on for 3 days and is not associated with fever or other upper respiratory, lower respiratory, or GI symptoms.  Patient reports that she thinks it is a recurrence of her uvulitis as she gets that every 3 to 4 months.  She has not been evaluated by ENT in the past.  Patient's physical exam reveals pearly gray tympanic membranes bilaterally with a normal light reflex and clear external auditory canals.  Nasal mucosa is pink and moist without discharge.  Oropharyngeal exam reveals no erythema or edema of the uvula.  The uvula is pink and moist, as is the remainder of the oral mucosa, and there is no posterior oropharyngeal erythema, injection, or exudate.  No cervical lymphadenopathy appreciated on  exam.  Patient does have fullness to the posterior submental area on either side of her tongue that is mildly tender to palpation.  There is no fluctuance or induration which would be concerning for abscess.  No stridor present when auscultating over the trachea.  Cardiopulmonary exam reveals clear lung sounds in all fields.  The etiology of patient's symptoms is unclear.  She does see Dr. Tami Ribas and [facility] and I have encouraged her to follow-up with him.  We will treat her pain and inflammation with a Medrol Dosepak.  Patient advised to go to the ER if she develops any worsening swelling or pain, especially if inhibits her ability to swallow or breathe.   Final Clinical Impressions(s) / UC Diagnoses   Final diagnoses:  Pharyngitis, unspecified etiology     Discharge Instructions      Your exam demonstrated what is called submental fullness, or swelling at the base of your tongue.  Take the Medrol dose pack as directed to help with pain and swelling.  Please call Dr. Ileene Hutchinson office and make an appointment for evaluation.  If you develop an increase in your swelling, especially if it causes you difficulty with breathing or swallowing, please  go to the ER for evaluation.     ED Prescriptions     Medication Sig Dispense Auth. Provider   methylPREDNISolone (MEDROL DOSEPAK) 4 MG TBPK tablet Take according to the package insert. 1 each Margarette Canada, NP      PDMP not reviewed this encounter.   Margarette Canada, NP 04/17/21 [phone]

## 2021-04-17 NOTE — Discharge Instructions (Signed)
Your exam demonstrated what is called submental fullness, or swelling at the base of your tongue.  Take the Medrol dose pack as directed to help with pain and swelling.  Please call Dr. Ileene Hutchinson office and make an appointment for evaluation.  If you develop an increase in your swelling, especially if it causes you difficulty with breathing or swallowing, please go to the ER for evaluation.

## 2021-04-18 DIAGNOSIS — A630 Anogenital (venereal) warts: Secondary | ICD-10-CM | POA: Diagnosis not present

## 2021-04-18 DIAGNOSIS — R238 Other skin changes: Secondary | ICD-10-CM | POA: Diagnosis not present

## 2021-05-01 ENCOUNTER — Encounter: Payer: 59 | Admitting: Internal Medicine

## 2021-05-03 ENCOUNTER — Other Ambulatory Visit: Payer: Self-pay

## 2021-05-03 ENCOUNTER — Encounter: Payer: Self-pay | Admitting: Internal Medicine

## 2021-05-03 ENCOUNTER — Ambulatory Visit (INDEPENDENT_AMBULATORY_CARE_PROVIDER_SITE_OTHER): Payer: 59 | Admitting: Internal Medicine

## 2021-05-03 VITALS — BP 114/80 | HR 72 | Temp 96.6°F | Ht 67.0 in | Wt 211.0 lb

## 2021-05-03 DIAGNOSIS — E669 Obesity, unspecified: Secondary | ICD-10-CM | POA: Diagnosis not present

## 2021-05-03 DIAGNOSIS — Z23 Encounter for immunization: Secondary | ICD-10-CM | POA: Diagnosis not present

## 2021-05-03 DIAGNOSIS — E785 Hyperlipidemia, unspecified: Secondary | ICD-10-CM | POA: Diagnosis not present

## 2021-05-03 DIAGNOSIS — Z0000 Encounter for general adult medical examination without abnormal findings: Secondary | ICD-10-CM

## 2021-05-03 DIAGNOSIS — Z1211 Encounter for screening for malignant neoplasm of colon: Secondary | ICD-10-CM | POA: Diagnosis not present

## 2021-05-03 DIAGNOSIS — R7303 Prediabetes: Secondary | ICD-10-CM | POA: Diagnosis not present

## 2021-05-03 DIAGNOSIS — D509 Iron deficiency anemia, unspecified: Secondary | ICD-10-CM

## 2021-05-03 DIAGNOSIS — E538 Deficiency of other specified B group vitamins: Secondary | ICD-10-CM | POA: Diagnosis not present

## 2021-05-03 DIAGNOSIS — N920 Excessive and frequent menstruation with regular cycle: Secondary | ICD-10-CM

## 2021-05-03 LAB — MICROALBUMIN / CREATININE URINE RATIO
Creatinine,U: 59.3 mg/dL
Microalb Creat Ratio: 3.5 mg/g (ref 0.0–30.0)
Microalb, Ur: 2.1 mg/dL — ABNORMAL HIGH (ref 0.0–1.9)

## 2021-05-03 LAB — COMPREHENSIVE METABOLIC PANEL
ALT: 16 U/L (ref 0–35)
AST: 16 U/L (ref 0–37)
Albumin: 4.3 g/dL (ref 3.5–5.2)
Alkaline Phosphatase: 44 U/L (ref 39–117)
BUN: 10 mg/dL (ref 6–23)
CO2: 26 mEq/L (ref 19–32)
Calcium: 9.2 mg/dL (ref 8.4–10.5)
Chloride: 105 mEq/L (ref 96–112)
Creatinine, Ser: 0.6 mg/dL (ref 0.40–1.20)
GFR: 108.46 mL/min (ref >60.00)
Glucose, Bld: 92 mg/dL (ref 70–99)
Potassium: 4.2 mEq/L (ref 3.5–5.1)
Sodium: 140 mEq/L (ref 135–145)
Total Bilirubin: 0.4 mg/dL (ref 0.2–1.2)
Total Protein: 6.6 g/dL (ref 6.0–8.3)

## 2021-05-03 LAB — CBC WITH DIFFERENTIAL/PLATELET
Basophils Absolute: 0.1 10*3/uL (ref 0.0–0.1)
Basophils Relative: 1 % (ref 0.0–3.0)
Eosinophils Absolute: 0.3 10*3/uL (ref 0.0–0.7)
Eosinophils Relative: 4.8 % (ref 0.0–5.0)
HCT: 37.7 % (ref 36.0–46.0)
Hemoglobin: 12.2 g/dL (ref 12.0–15.0)
Lymphocytes Relative: 28.4 % (ref 12.0–46.0)
Lymphs Abs: 1.6 10*3/uL (ref 0.7–4.0)
MCHC: 32.4 g/dL (ref 30.0–36.0)
MCV: 85.2 fl (ref 78.0–100.0)
Monocytes Absolute: 0.5 10*3/uL (ref 0.1–1.0)
Monocytes Relative: 8.1 % (ref 3.0–12.0)
Neutro Abs: 3.2 10*3/uL (ref 1.4–7.7)
Neutrophils Relative %: 57.7 % (ref 43.0–77.0)
Platelets: 245 10*3/uL (ref 150.0–400.0)
RBC: 4.42 Mil/uL (ref 3.87–5.11)
RDW: 17.6 % — ABNORMAL HIGH (ref 11.5–15.5)
WBC: 5.6 10*3/uL (ref 4.0–10.5)

## 2021-05-03 LAB — LIPID PANEL
Cholesterol: 222 mg/dL — ABNORMAL HIGH (ref 0–200)
HDL: 62.5 mg/dL (ref >39.00)
LDL Cholesterol: 144 mg/dL — ABNORMAL HIGH (ref 0–99)
NonHDL: 159.08
Total CHOL/HDL Ratio: 4
Triglycerides: 76 mg/dL (ref 0.0–149.0)
VLDL: 15.2 mg/dL (ref 0.0–40.0)

## 2021-05-03 LAB — VITAMIN B12: Vitamin B-12: 397 pg/mL (ref 211–911)

## 2021-05-03 LAB — HEMOGLOBIN A1C: Hgb A1c MFr Bld: 5.9 % (ref 4.6–6.5)

## 2021-05-03 NOTE — Assessment & Plan Note (Signed)
secondary to menorrhagia.   Considering uterine ablation.

## 2021-05-03 NOTE — Progress Notes (Signed)
Patient ID: NAZ DENUNZIO, female    DOB: 12-21-1975  Age: 45 y.o. MRN: [id number]  The patient is here for annual preventive examination and management of other chronic and acute problems.    This visit occurred during the SARS-CoV-2 public health emergency.  Safety protocols were in place, including screening questions prior to the visit, additional usage of staff PPE, and extensive cleaning of exam room while observing appropriate contact time as indicated for disinfecting solutions.   The risk factors are reflected in the social history.  The roster of all physicians providing medical care to patient - is listed in the Snapshot section of the chart.  Activities of daily living:  The patient is 100% independent in all ADLs: dressing, toileting, feeding as well as independent mobility  Home safety : The patient has smoke detectors in the home. They wear seatbelts.  There are no firearms at home. There is no violence in the home.   There is no risks for hepatitis, STDs or HIV. There is no   history of blood transfusion. They have no travel history to infectious disease endemic areas of the world.  The patient has seen their dentist in the last six month. They have seen their eye doctor in the last year. They admit to slight hearing difficulty with regard to whispered voices and some television programs.  They have deferred audiologic testing in the last year.  They do not  have excessive sun exposure. Discussed the need for sun protection: hats, long sleeves and use of sunscreen if there is significant sun exposure.   Diet: the importance of a healthy diet is discussed. They do have a healthy diet.  The benefits of regular aerobic exercise were discussed. She walks 4 times per week ,  20 minutes.   Depression screen: there are no signs or vegative symptoms of depression- irritability, change in appetite, anhedonia, sadness/tearfullness.  Cognitive assessment: the patient manages all their  financial and personal affairs and is actively engaged. They could relate day,date,year and events; recalled 2/3 objects at 3 minutes; performed clock-face test normally.  The following portions of the patient's history were reviewed and updated as appropriate: allergies, current medications, past family history, past medical history,  past surgical history, past social history  and problem list.  Visual acuity was not assessed per patient preference since she has regular follow up with her ophthalmologist. Hearing and body mass index were assessed and reviewed.   During the course of the visit the patient was educated and counseled about appropriate screening and preventive services including : fall prevention , diabetes screening, nutrition counseling, colorectal cancer screening, and recommended immunizations.    CC: The primary encounter diagnosis was Iron deficiency anemia, unspecified iron deficiency anemia type. Diagnoses of Encounter for preventive health examination, Prediabetes, Hyperlipidemia, unspecified hyperlipidemia type, Colon cancer screening, B12 deficiency, Need for Td vaccine, Menorrhagia with regular cycle, and Obesity (BMI 30.0-34.9) were also pertinent to this visit.  1) weight gain .  wants to consider trial of ozempic in 3 months  2) odynophagia:  chronic,  acutely worsened recently.  Told by urgent care that the base of her tongue was swollen.Marland Kitchen  given prednsione.  Sees Heloise Ochoa.  Non smoker,  no alcohol .  snores loudly   3) IDA secondary to menorrhagia:  taking iron.  Considering having an ablation.   4) PAP/mammogram done by GYN.  Normal PAP sept 2022    5) Lymphedema diagnosed by Dr Lucky Cowboy.  Still waiting  for pumps.  Wearing stockings.   History Kirstein has a past medical history of Anemia, Asthma, Chicken pox, Complication of anesthesia, and PONV (postoperative nausea and vomiting).   She has a past surgical history that includes Laparoscopic gastric banding  (2008); breast augmentation (2003); Hernia repair; and Wisdom tooth extraction.   Her family history includes Atrial fibrillation in her mother; Cancer (age of onset: 32) in her brother; Cancer (age of onset: 57) in her mother; Diabetes in her brother, maternal aunt, maternal grandmother, and mother; Hyperlipidemia in her mother; Hypertension in her mother.She reports that she has never smoked. She has never used smokeless tobacco. She reports current alcohol use of about 3.0 standard drinks per week. She reports that she does not use drugs.  Outpatient Medications Prior to Visit  Medication Sig Dispense Refill   ferrous sulfate 325 (65 FE) MG tablet Take 325 mg by mouth daily.     Multiple Vitamin (MULTIVITAMIN) tablet Take 2 tablets by mouth daily.     Cholecalciferol (D3-1000 PO) Take by mouth. (Patient not taking: Reported on 05/03/2021)     cyanocobalamin (,VITAMIN B-12,) 1000 MCG/ML injection INJECT 1 ML (1,000 MCG TOTAL) INTO THE MUSCLE ONCE A WEEK. FOR 4 WEEKS, THEN MONTHLY THEREAFTER (Patient not taking: Reported on 05/03/2021) 4 mL 2   methylPREDNISolone (MEDROL DOSEPAK) 4 MG TBPK tablet Take according to the package insert. (Patient not taking: Reported on 05/03/2021) 1 each 0   No facility-administered medications prior to visit.    Review of Systems  Patient denies headache, fevers, malaise, unintentional weight loss, skin rash, eye pain, sinus congestion and sinus pain, sore throat, dysphagia,  hemoptysis , cough, dyspnea, wheezing, chest pain, palpitations, orthopnea, edema, abdominal pain, nausea, melena, diarrhea, constipation, flank pain, dysuria, hematuria, urinary  Frequency, nocturia, numbness, tingling, seizures,  Focal weakness, Loss of consciousness,  Tremor, insomnia, depression, anxiety, and suicidal ideation.    Objective:  BP 114/80 (BP Location: Left Arm, Patient Position: Sitting, Cuff Size: Large)   Pulse 72   Temp (!) 96.6 F (35.9 C) (Temporal)   Ht 5' 7"  (1.702 m)   Wt 211 lb (95.7 kg)   LMP 04/06/2021   SpO2 99%   BMI 33.05 kg/m   Physical Exam  General appearance: alert, cooperative and appears stated age Ears: normal TM's and external ear canals both ears Throat: lips, mucosa, and tongue normal; teeth and gums normal Neck: no adenopathy, no carotid bruit, supple, symmetrical, trachea midline and thyroid not enlarged, symmetric, no tenderness/mass/nodules Back: symmetric, no curvature. ROM normal. No CVA tenderness. Lungs: clear to auscultation bilaterally Heart: regular rate and rhythm, S1, S2 normal, no murmur, click, rub or gallop Abdomen: soft, non-tender; bowel sounds normal; no masses,  no organomegaly Pulses: 2+ and symmetric Skin: Skin color, texture, turgor normal. No rashes or lesions Lymph nodes: Cervical, supraclavicular, and axillary nodes normal.  Assessment & Plan:   Problem List Items Addressed This Visit     Menorrhagia    Resulting in IDA>  considering uterine ablation       B12 deficiency   Relevant Orders   Vitamin B12 (Completed)   Encounter for preventive health examination    age appropriate education and counseling updated, referrals for preventative services and immunizations addressed, dietary and smoking counseling addressed, most recent labs reviewed.  I have personally reviewed and have noted:   1) the patient's medical and social history 2) The pt's use of alcohol, tobacco, and illicit drugs 3) The patient's current medications and  supplements 4) Functional ability including ADL's, fall risk, home safety risk, hearing and visual impairment 5) Diet and physical activities 6) Evidence for depression or mood disorder 7) The patient's height, weight, and BMI have been recorded in the chart   I have made referrals, and provided counseling and education based on review of the above      Relevant Orders   Comp Met (CMET) (Completed)   Lipid Profile (Completed)   HgB A1c (Completed)   Urine  Microalbumin w/creat. ratio (Completed)   Iron deficiency anemia - Primary   Relevant Orders   Iron, TIBC and Ferritin Panel   CBC with Differential/Platelet (Completed)   Obesity (BMI 30.0-34.9)    Her diet is fairly stringent but has gained weight since her post surgical nadir  . Encouraged to resume daily exercise in some form that she enjoys, both to help her achieve her goals and to provide work/life balance      Other Visit Diagnoses     Prediabetes       Relevant Orders   Comp Met (CMET) (Completed)   HgB A1c (Completed)   Urine Microalbumin w/creat. ratio (Completed)   Hyperlipidemia, unspecified hyperlipidemia type       Relevant Orders   Lipid Profile (Completed)   Colon cancer screening       Relevant Orders   Cologuard   Need for Td vaccine       Relevant Orders   Td : Tetanus/diphtheria >7yo Preservative  free (Completed)       I have discontinued Carita Pian Marcell's Cholecalciferol (D3-1000 PO), cyanocobalamin, and methylPREDNISolone. I am also having her maintain her multivitamin and ferrous sulfate.  No orders of the defined types were placed in this encounter.   Medications Discontinued During This Encounter  Medication Reason   methylPREDNISolone (MEDROL DOSEPAK) 4 MG TBPK tablet    cyanocobalamin (,VITAMIN B-12,) 1000 MCG/ML injection    Cholecalciferol (D3-1000 PO)     Follow-up: No follow-ups on file.   Crecencio Mc, MD

## 2021-05-03 NOTE — Assessment & Plan Note (Signed)

## 2021-05-03 NOTE — Patient Instructions (Signed)
You received your tetanus vaccine today   I will initiate the order for your colon cancer screening  Test, the one called  Cologuard.  It will be delivered to your house, and you will send off a stool sample in the envelope it provides.      Return in 3 months to discuss ozempic/wegovy

## 2021-05-03 NOTE — Assessment & Plan Note (Signed)
Her diet is fairly stringent but has gained weight since her post surgical nadir  . Encouraged to resume daily exercise in some form that she enjoys, both to help her achieve her goals and to provide work/life balance

## 2021-05-03 NOTE — Assessment & Plan Note (Addendum)
Resulting in IDA>  considering uterine ablation .  pelvic ultrasound normal

## 2021-05-04 LAB — IRON,TIBC AND FERRITIN PANEL
%SAT: 34 % (calc) (ref 16–45)
Ferritin: 13 ng/mL — ABNORMAL LOW (ref 16–232)
Iron: 100 ug/dL (ref 40–190)
TIBC: 298 mcg/dL (calc) (ref 250–450)

## 2021-05-05 DIAGNOSIS — R070 Pain in throat: Secondary | ICD-10-CM | POA: Diagnosis not present

## 2021-05-05 DIAGNOSIS — K219 Gastro-esophageal reflux disease without esophagitis: Secondary | ICD-10-CM | POA: Diagnosis not present

## 2021-05-05 DIAGNOSIS — G4733 Obstructive sleep apnea (adult) (pediatric): Secondary | ICD-10-CM | POA: Diagnosis not present

## 2021-05-16 DIAGNOSIS — Z1211 Encounter for screening for malignant neoplasm of colon: Secondary | ICD-10-CM | POA: Diagnosis not present

## 2021-05-16 NOTE — Progress Notes (Signed)
Virtual Visit via Video Note  I connected with Alicia Blair on 05/16/21 at  8:00 AM EST by a video enabled telemedicine application and verified that I am speaking with the correct person using two identifiers.  Location: Patient: at home  Provider: Provider: Provider's office at  [facility], [place].      I discussed the limitations of evaluation and management by telemedicine and the availability of in person appointments. The patient expressed understanding and agreed to proceed.  History of Present Illness: Patient started on Monday night, with fever, chill, body aches. Temperature was 102 this morning not medicated. Highest has been 103. Chest congestion. No wheezing feels like could start. Nausea no vomiting. Husband has been sick with cough for 2 weeks. Has  not had any testing for respiratory virus.  Head congestion.  Denies any ear pain at this time.  Patient  denies any rash, chest pain, shortness of breath, vomiting, or diarrhea.  She has tried over the counter medications.  She does work in [employer].    no other known exposures.  Observations/Objective:   Patient is alert and oriented and responsive to questions Engages in conversation with provider. Speaks in full sentences without any pauses without any shortness of breath or distress.    Assessment and Plan:  Viral upper respiratory tract infection - Plan: brompheniramine-pseudoephedrine-DM 30-2-10 MG/5ML syrup, COVID-19, Flu A+B and RSV Meds ordered this encounter  Medications   brompheniramine-pseudoephedrine-DM 30-2-10 MG/5ML syrup    Sig: Take 5 mLs by mouth 4 (four) times daily as needed.    Dispense:  473 mL    Refill:  0     Testing for flu/covid and RSV this afternoon.   Follow Up Instructions:  Advised in person evaluation at anytime is advised if any symptoms do not improve, worsen or change at any given time.  Red Flags discussed. The patient was given clear  instructions to go to ER or return to medical center if any red flags develop, symptoms do not improve, worsen or new problems develop. They verbalized understanding.    I discussed the assessment and treatment plan with the patient. The patient was provided an opportunity to ask questions and all were answered. The patient agreed with the plan and demonstrated an understanding of the instructions.   The patient was advised to call back or seek an in-person evaluation if the symptoms worsen or if the condition fails to improve as anticipated.  I provided 20 minutes of non-face-to-face time during this encounter.   Marcille Buffy, FNP

## 2021-05-17 ENCOUNTER — Telehealth (INDEPENDENT_AMBULATORY_CARE_PROVIDER_SITE_OTHER): Payer: 59 | Admitting: Adult Health

## 2021-05-17 ENCOUNTER — Other Ambulatory Visit: Payer: Self-pay | Admitting: Adult Health

## 2021-05-17 ENCOUNTER — Encounter: Payer: Self-pay | Admitting: Adult Health

## 2021-05-17 ENCOUNTER — Other Ambulatory Visit: Payer: Self-pay

## 2021-05-17 ENCOUNTER — Other Ambulatory Visit: Payer: 59

## 2021-05-17 VITALS — Temp 102.0°F

## 2021-05-17 DIAGNOSIS — R051 Acute cough: Secondary | ICD-10-CM | POA: Insufficient documentation

## 2021-05-17 DIAGNOSIS — J069 Acute upper respiratory infection, unspecified: Secondary | ICD-10-CM

## 2021-05-17 DIAGNOSIS — R059 Cough, unspecified: Secondary | ICD-10-CM | POA: Insufficient documentation

## 2021-05-17 MED ORDER — PSEUDOEPH-BROMPHEN-DM 30-2-10 MG/5ML PO SYRP
5.0000 mL | ORAL_SOLUTION | Freq: Four times a day (QID) | ORAL | 0 refills | Status: DC | PRN
  Filled 2021-05-17: qty 118, 6d supply, fill #0

## 2021-05-17 NOTE — Patient Instructions (Signed)
Brompheniramine; Phenylephrine oral solution What is this medication? BROMPHENIRAMINE; PHENYLEPHRINE (brome fen IR a meen; fen il EF rin) is a combination of an antihistamine and a decongestant. It is used to treat the symptoms of allergy and colds. This medicine will not treat an infection. This medicine may be used for other purposes; ask your health care provider or pharmacist if you have questions. COMMON BRAND NAME(S): Alenaze D, Alenaze-D NR, BPM PE, BroveX PEB, Dimaphen, Dimetapp Children[facility] Cold & Allergy, LoHist-PEB, Maxi-Tuss PE, RYNEX PE, Vazol D What should I tell my care team before I take this medication? They need to know if you have any of these conditions: blockage in your bowels diabetes (high blood sugar) glaucoma heart disease high blood pressure lung or breathing disease (asthma, COPD) prostate disease stomach ulcers, other stomach or intestine problems taken an MAOI such as Marplan, Nardil, or Parnate in the last 14 days thyroid disease an unusual or allergic reaction to brompheniramine, phenylephrine, other medicines, foods, dyes, or preservatives pregnant or trying to get pregnant breast-feeding How should I use this medication? Take this medicine by mouth. Take it as directed on the label. Use a specially marked oral syringe, spoon, or dropper to measure each dose. Ask your pharmacist if you do not have one. Household spoons are not accurate. Do not take it more often than directed. Talk to your health care provider about the use of this medicine in children. While this drug may be prescribed for children as young as 6 years for selected conditions, precautions do apply. Patients over 83 years of age may have a stronger reaction and need a smaller dose. Overdosage: If you think you have taken too much of this medicine contact a poison control center or emergency room at once. NOTE: This medicine is only for you. Do not share this medicine with others. What if I miss  a dose? This does not apply. This medicine is not for regular use. It should only be used as needed. What may interact with this medication? Do not take this medicine with any of the following medications: MAOIs like Marplan, Nardil, Parnate This medicine may also interact with the following medications: alcohol certain medicines for anxiety or sleep certain medicines for depression like amitriptyline, fluoxetine, sertraline certain medicines for seizures like phenobarbital, primidone general anesthetics like halothane, isoflurane, methoxyflurane, propofol medicines that relax muscles for surgery narcotic medicines for pain other antihistamines for allergy, cough, and cold phenothiazines like chlorpromazine, mesoridazine, prochlorperazine, thioridazine This list may not describe all possible interactions. Give your health care provider a list of all the medicines, herbs, non-prescription drugs, or dietary supplements you use. Also tell them if you smoke, drink alcohol, or use illegal drugs. Some items may interact with your medicine. What should I watch for while using this medication? Visit your health care provider for regular checks on your progress. Tell your health care provider if your symptoms do not start to get better or if they get worse. If you need to use this medicine for more than 7 days, talk to your health care provider. You may get drowsy or dizzy. Do not drive, use machinery, or do anything that needs mental alertness until you know how this medicine affects you. Do not stand or sit up quickly, especially if you are an older patient. This reduces the risk of dizzy or fainting spells. Alcohol may interfere with the effect of this medicine. Avoid alcoholic drinks. Your mouth may get dry. Chewing sugarless gum or sucking hard candy  and drinking plenty of water may help. Contact your health care provider if the problem does not go away or is severe. What side effects may I notice  from receiving this medication? Side effects that you should report to your doctor or health care professional as soon as possible: allergic reactions (skin rash, itching or hives, swelling of the face, lips, or tongue) increase in blood pressure seizures trouble passing urine Side effects that usually do not require medical attention (report these to your doctor or health care professional if they continue or are bothersome): anxious dizziness dry mouth trouble sleeping unusually weak or tired This list may not describe all possible side effects. Call your doctor for medical advice about side effects. You may report side effects to FDA at 1-800-FDA-1088. Where should I keep my medication? Keep out of the reach of children and pets. Store at room temperature between 20 and 25 degrees C (68 and 77 degrees F). Get rid of any unused medicine after the expiration date. To get rid of medicines that are no longer needed or have expired: Take the medicine to a medicine take-back program. Check with your pharmacy or law enforcement to find a location. If you cannot return the medicine, check the label or package insert to see if the medicine should be thrown out in the garbage or flushed down the toilet. If you are not sure, ask your health care provider. If it is safe to put it in the trash, pour the medicine out of the container. Mix the medicine with cat litter, dirt, coffee grounds, or other unwanted substance. Seal the mixture in a bag or container. Put it in the trash. NOTE: This sheet is a summary. It may not cover all possible information. If you have questions about this medicine, talk to your doctor, pharmacist, or health care provider.  2022 Elsevier/Gold Standard (2020-01-22 00:00:00) Upper Respiratory Infection, Adult An upper respiratory infection (URI) affects the nose, throat, and upper airways that lead to the lungs. The most common type of URI is often called the common cold. URIs  usually get better on their own, without medical treatment. What are the causes? A URI is caused by a germ (virus). You may catch these germs by: Breathing in droplets from an infected person's cough or sneeze. Touching something that has the germ on it (is contaminated) and then touching your mouth, nose, or eyes. What increases the risk? You are more likely to get a URI if: You are very young or very old. You have close contact with others, such as at work, school, or a health care facility. You smoke. You have long-term (chronic) heart or lung disease. You have a weakened disease-fighting system (immune system). You have nasal allergies or asthma. You have a lot of stress. You have poor nutrition. What are the signs or symptoms? Runny or stuffy (congested) nose. Cough. Sneezing. Sore throat. Headache. Feeling tired (fatigue). Fever. Not wanting to eat as much as usual. Pain in your forehead, behind your eyes, and over your cheekbones (sinus pain). Muscle aches. Redness or irritation of the eyes. Pressure in the ears or face. How is this treated? URIs usually get better on their own within 7-10 days. Medicines cannot cure URIs, but your doctor may recommend certain medicines to help relieve symptoms, such as: Over-the-counter cold medicines. Medicines to reduce coughing (cough suppressants). Coughing is a type of defense against infection that helps to clear the nose, throat, windpipe, and lungs (respiratory system). Take these medicines  only as told by your doctor. Medicines to lower your fever. Follow these instructions at home: Activity Rest as needed. If you have a fever, stay home from work or school until your fever is gone, or until your doctor says you may return to work or school. You should stay home until you cannot spread the infection anymore (you are not contagious). Your doctor may have you wear a face mask so you have less risk of spreading the  infection. Relieving symptoms Rinse your mouth often with salt water. To make salt water, dissolve -1 tsp (3-6 g) of salt in 1 cup (237 mL) of warm water. Use a cool-mist humidifier to add moisture to the air. This can help you breathe more easily. Eating and drinking  Drink enough fluid to keep your pee (urine) pale yellow. Eat soups and other clear broths. General instructions  Take over-the-counter and prescription medicines only as told by your doctor. Do not smoke or use any products that contain nicotine or tobacco. If you need help quitting, ask your doctor. Avoid being where people are smoking (avoid secondhand smoke). Stay up to date on all your shots (immunizations), and get the flu shot every year. Keep all follow-up visits. How to prevent the spread of infection to others  Wash your hands with soap and water for at least 20 seconds. If you cannot use soap and water, use hand sanitizer. Avoid touching your mouth, face, eyes, or nose. Cough or sneeze into a tissue or your sleeve or elbow. Do not cough or sneeze into your hand or into the air. Contact a doctor if: You are getting worse, not better. You have any of these: A fever or chills. Brown or red mucus in your nose. Yellow or brown fluid (discharge)coming from your nose. Pain in your face, especially when you bend forward. Swollen neck glands. Pain when you swallow. White areas in the back of your throat. Get help right away if: You have shortness of breath that gets worse. You have very bad or constant: Headache. Ear pain. Pain in your forehead, behind your eyes, and over your cheekbones (sinus pain). Chest pain. You have long-lasting (chronic) lung disease along with any of these: Making high-pitched whistling sounds when you breathe, most often when you breathe out (wheezing). Long-lasting cough (more than 14 days). Coughing up blood. A change in your usual mucus. You have a stiff neck. You have changes  in your: Vision. Hearing. Thinking. Mood. These symptoms may be an emergency. Get help right away. Call 911. Do not wait to see if the symptoms will go away. Do not drive yourself to the hospital. Summary An upper respiratory infection (URI) is caused by a germ (virus). The most common type of URI is often called the common cold. URIs usually get better within 7-10 days. Take over-the-counter and prescription medicines only as told by your doctor. This information is not intended to replace advice given to you by your health care provider. Make sure you discuss any questions you have with your health care provider. Document Revised: 01/04/2021 Document Reviewed: 01/04/2021 Elsevier Patient Education  [place].

## 2021-05-18 LAB — COVID-19, FLU A+B AND RSV
Influenza A, NAA: NOT DETECTED
Influenza B, NAA: NOT DETECTED
RSV, NAA: NOT DETECTED
SARS-CoV-2, NAA: DETECTED — AB

## 2021-05-18 NOTE — Progress Notes (Signed)
She is positive for covid. She is within the 5 day window for antiviral medication Molnupiravir if she would like sent in as discussed at video visit please let provider know.  If any symptoms worsening at anytime she should be seen immediately.

## 2021-05-19 ENCOUNTER — Telehealth: Payer: Self-pay

## 2021-05-19 NOTE — Telephone Encounter (Signed)
Pt was already called and aware of results.

## 2021-05-19 NOTE — Telephone Encounter (Signed)
Patient returned office phone call for results

## 2021-05-19 NOTE — Telephone Encounter (Signed)
LMTCB for labs.

## 2021-05-21 LAB — COLOGUARD: COLOGUARD: NEGATIVE

## 2021-05-22 ENCOUNTER — Encounter: Payer: Self-pay | Admitting: Internal Medicine

## 2021-06-02 DIAGNOSIS — L858 Other specified epidermal thickening: Secondary | ICD-10-CM | POA: Diagnosis not present

## 2021-06-07 DIAGNOSIS — N764 Abscess of vulva: Secondary | ICD-10-CM | POA: Diagnosis not present

## 2021-06-16 DIAGNOSIS — Z1231 Encounter for screening mammogram for malignant neoplasm of breast: Secondary | ICD-10-CM | POA: Diagnosis not present

## 2021-06-20 ENCOUNTER — Other Ambulatory Visit: Payer: Self-pay | Admitting: Obstetrics and Gynecology

## 2021-06-20 DIAGNOSIS — R928 Other abnormal and inconclusive findings on diagnostic imaging of breast: Secondary | ICD-10-CM

## 2021-06-21 ENCOUNTER — Ambulatory Visit
Admission: RE | Admit: 2021-06-21 | Discharge: 2021-06-21 | Disposition: A | Discharge status: Home / Self Care | Payer: 59 | Source: Ambulatory Visit | Attending: Obstetrics and Gynecology | Admitting: Obstetrics and Gynecology

## 2021-06-21 ENCOUNTER — Ambulatory Visit: Admission: RE | Admit: 2021-06-21 | Payer: 59 | Source: Ambulatory Visit

## 2021-06-21 ENCOUNTER — Other Ambulatory Visit: Payer: Self-pay | Admitting: Obstetrics and Gynecology

## 2021-06-21 ENCOUNTER — Other Ambulatory Visit: Payer: Self-pay

## 2021-06-21 DIAGNOSIS — R928 Other abnormal and inconclusive findings on diagnostic imaging of breast: Secondary | ICD-10-CM

## 2021-06-21 DIAGNOSIS — R921 Mammographic calcification found on diagnostic imaging of breast: Secondary | ICD-10-CM | POA: Diagnosis not present

## 2021-06-30 DIAGNOSIS — N898 Other specified noninflammatory disorders of vagina: Secondary | ICD-10-CM | POA: Diagnosis not present

## 2021-06-30 DIAGNOSIS — N9089 Other specified noninflammatory disorders of vulva and perineum: Secondary | ICD-10-CM | POA: Diagnosis not present

## 2021-07-24 ENCOUNTER — Other Ambulatory Visit: Payer: Self-pay

## 2021-08-04 ENCOUNTER — Encounter: Payer: Self-pay | Admitting: Internal Medicine

## 2021-08-04 ENCOUNTER — Telehealth: Payer: Self-pay | Admitting: Internal Medicine

## 2021-08-04 ENCOUNTER — Other Ambulatory Visit: Payer: Self-pay

## 2021-08-04 ENCOUNTER — Ambulatory Visit: Payer: 59 | Admitting: Internal Medicine

## 2021-08-04 VITALS — BP 120/78 | HR 89 | Temp 98.0°F | Ht 67.0 in | Wt 215.4 lb

## 2021-08-04 DIAGNOSIS — E669 Obesity, unspecified: Secondary | ICD-10-CM

## 2021-08-04 DIAGNOSIS — R7303 Prediabetes: Secondary | ICD-10-CM

## 2021-08-04 DIAGNOSIS — R5383 Other fatigue: Secondary | ICD-10-CM

## 2021-08-04 DIAGNOSIS — D509 Iron deficiency anemia, unspecified: Secondary | ICD-10-CM | POA: Diagnosis not present

## 2021-08-04 DIAGNOSIS — E538 Deficiency of other specified B group vitamins: Secondary | ICD-10-CM

## 2021-08-04 MED ORDER — SEMAGLUTIDE-WEIGHT MANAGEMENT 0.25 MG/0.5ML ~~LOC~~ SOAJ
0.2500 mg | SUBCUTANEOUS | 2 refills | Status: AC
  Filled 2021-08-04 – 2021-08-10 (×5): qty 2, 28d supply, fill #0

## 2021-08-04 NOTE — Assessment & Plan Note (Signed)
Patient has been unable to lose or maintain a healthy weight despite good effort. Encouraged to increase exercise involvement to include more intense aerobic activity for 30 minutes 5 days per week.  Screened for contraindications to use of  GLP 1 agonists for appetite suppression and she has none.  The risks and benefits of pharmacotherapy discussed and she is requesting a trial of therapy .  rx written.

## 2021-08-04 NOTE — Telephone Encounter (Signed)
Patient scheduled for labs 10/30/21 as requested by Patient. States she was told to get a lab appointment scheduled in may when she checks out.   Needing orders placed.

## 2021-08-04 NOTE — Progress Notes (Signed)
Subjective:  Patient ID: Alicia Blair, female    DOB: Oct 25, 1975  Age: 46 y.o. MRN: [id number]  CC: The primary encounter diagnosis was B12 deficiency. Diagnoses of Iron deficiency anemia, unspecified iron deficiency anemia type, Prediabetes, Other fatigue, and Obesity (BMI 30.0-34.9) were also pertinent to this visit.   This visit occurred during the SARS-CoV-2 public health emergency.  Safety protocols were in place, including screening questions prior to the visit, additional usage of staff PPE, and extensive cleaning of exam room while observing appropriate contact time as indicated for disinfecting solutions.    HPI KIRSTI MCALPINE presents for  Chief Complaint  Patient presents with   Follow-up    Follow up to discuss weight loss medications   1) obesity:   frustrated at inability to lose weight.  S/p gastric band in 2008. Removed te band June 2022.     Nadir was 150 after surgery,  stayed there for 3-4 years.  The 170 ls,  then pregnancy in 2017  182 post partum.  Exercised daily for 11 years.  Since 2017 has been steadily gaining  to pre surgery weight now.  Diet is healthy.  Weighs her food. Increased appetite after lunch . Eats apple, peanut butter .  Walks for exercise 3 days per week at the most.  Wants to try ozempic /wegovy      Outpatient Medications Prior to Visit  Medication Sig Dispense Refill   ferrous sulfate 325 (65 FE) MG tablet Take 325 mg by mouth daily.     Multiple Vitamin (MULTIVITAMIN) tablet Take 2 tablets by mouth daily.     omeprazole (PRILOSEC) 40 MG capsule Take 40 mg by mouth daily.     brompheniramine-pseudoephedrine-DM 30-2-10 MG/5ML syrup Take 5 mLs by mouth 4 (four) times daily as needed. (Patient not taking: Reported on 08/04/2021) 473 mL 0   No facility-administered medications prior to visit.    Review of Systems;  Patient denies headache, fevers, malaise, unintentional weight loss, skin rash, eye pain, sinus congestion and sinus pain,  sore throat, dysphagia,  hemoptysis , cough, dyspnea, wheezing, chest pain, palpitations, orthopnea, edema, abdominal pain, nausea, melena, diarrhea, constipation, flank pain, dysuria, hematuria, urinary  Frequency, nocturia, numbness, tingling, seizures,  Focal weakness, Loss of consciousness,  Tremor, insomnia, depression, anxiety, and suicidal ideation.      Objective:  BP 120/78 (BP Location: Left Arm, Patient Position: Sitting, Cuff Size: Large)    Pulse 89    Temp 98 F (36.7 C) (Oral)    Ht 5' 7"  (1.702 m)    Wt 215 lb 6.4 oz (97.7 kg)    SpO2 99%    BMI 33.74 kg/m   BP Readings from Last 3 Encounters:  08/04/21 120/78  05/03/21 114/80  04/17/21 (!) 137/92    Wt Readings from Last 3 Encounters:  08/04/21 215 lb 6.4 oz (97.7 kg)  05/03/21 211 lb (95.7 kg)  04/17/21 205 lb (93 kg)    General appearance: alert, cooperative and appears stated age Ears: normal TM's and external ear canals both ears Throat: lips, mucosa, and tongue normal; teeth and gums normal Neck: no adenopathy, no carotid bruit, supple, symmetrical, trachea midline and thyroid not enlarged, symmetric, no tenderness/mass/nodules Back: symmetric, no curvature. ROM normal. No CVA tenderness. Lungs: clear to auscultation bilaterally Heart: regular rate and rhythm, S1, S2 normal, no murmur, click, rub or gallop Abdomen: soft, non-tender; bowel sounds normal; no masses,  no organomegaly Pulses: 2+ and symmetric Skin: Skin color, texture, turgor  normal. No rashes or lesions Lymph nodes: Cervical, supraclavicular, and axillary nodes normal.  Lab Results  Component Value Date   HGBA1C 5.9 05/03/2021   HGBA1C 6.0 10/26/2020   HGBA1C 6.1 04/27/2020    Lab Results  Component Value Date   CREATININE 0.60 05/03/2021   CREATININE 0.61 02/23/2021   CREATININE 0.56 10/26/2020    Lab Results  Component Value Date   WBC 5.6 05/03/2021   HGB 12.2 05/03/2021   HCT 37.7 05/03/2021   PLT 245.0 05/03/2021   GLUCOSE  92 05/03/2021   CHOL 222 (H) 05/03/2021   TRIG 76.0 05/03/2021   HDL 62.50 05/03/2021   LDLCALC 144 (H) 05/03/2021   ALT 16 05/03/2021   AST 16 05/03/2021   NA 140 05/03/2021   K 4.2 05/03/2021   CL 105 05/03/2021   CREATININE 0.60 05/03/2021   BUN 10 05/03/2021   CO2 26 05/03/2021   TSH 3.33 02/23/2021   HGBA1C 5.9 05/03/2021   MICROALBUR 2.1 (H) 05/03/2021    MM DIAG BREAST W/IMPLANT TOMO BILATERAL  Result Date: 06/21/2021 CLINICAL DATA:  The patient was called back from screening mammography for a right breast asymmetry. EXAM: DIGITAL DIAGNOSTIC BILATERAL MAMMOGRAM WITH IMPLANTS, CAD AND TOMOSYNTHESIS TECHNIQUE: Bilateral digital diagnostic mammography and breast tomosynthesis was performed. The images were evaluated with computer-aided detection. Standard and/or implant displaced views were performed. COMPARISON:  Previous exam(s). ACR Breast Density Category b: There are scattered areas of fibroglandular density. FINDINGS: The right breast asymmetry resolves on today's imaging. There are calcifications projected over the implant. 3D views were obtained of both breasts without the implants displaced. The calcifications on the right appear to be associated with the implant or implant capsule located along the valve, muscle, and radial folds. No calcifications are seen on the left. The patient has retropectoral implants. IMPRESSION: The right breast calcifications appear to be capsular in origin rather than within the breast tissue. No other suspicious findings. RECOMMENDATION: Recommend six-month follow-up mammography of the right breast. Recommend obtaining 3D views without implant displacement to evaluate the calcifications which are thought to be capsular rather than parenchymal. I have discussed the findings and recommendations with the patient. If applicable, a reminder letter will be sent to the patient regarding the next appointment. BI-RADS CATEGORY  3: Probably benign. Electronically  Signed   By: Dorise Bullion III M.D.   On: 06/21/2021 12:37   Assessment & Plan:   Problem List Items Addressed This Visit     B12 deficiency - Primary   Relevant Orders   Vitamin B12   Iron deficiency anemia   Relevant Orders   IBC + Ferritin   CBC with Differential/Platelet   Obesity (BMI 30.0-34.9)    Patient has been unable to lose or maintain a healthy weight despite good effort. Encouraged to increase exercise involvement to include more intense aerobic activity for 30 minutes 5 days per week.  Screened for contraindications to use of  GLP 1 agonists for appetite suppression and she has none.  The risks and benefits of pharmacotherapy discussed and she is requesting a trial of therapy .  rx written.       Other Visit Diagnoses     Prediabetes       Relevant Orders   Hemoglobin A1c   Comprehensive metabolic panel   Lipid panel   Other fatigue       Relevant Orders   TSH       I spent 30 minutes dedicated to the care  of this patient on the date of this encounter to include pre-visit review of patient's medical history,  most recent imaging studies, Face-to-face time with the patient , and post visit ordering of testing and therapeutics.    Follow-up: No follow-ups on file.   Crecencio Mc, MD

## 2021-08-04 NOTE — Patient Instructions (Addendum)
° °  I agree with using Wegovy   to help you lose weight    The dose for the first 4 weekly doses is 0.25 mg.  You may have mild nausea on the first or second day but this should resolve.  If not  ,  stop the medication.   As long as you are losing weight,  you can continue the dose you are on .  Only  request the increase in dose to 0.5 mg if if your weight has plateaued.  Let me know when you need a refill and what dose you are taking.    Magnesium citrate 250 mg nightly if needed for resulting In  constipation   Exercise exercise exercise don't forget to exercise

## 2021-08-07 ENCOUNTER — Other Ambulatory Visit: Payer: Self-pay

## 2021-08-07 DIAGNOSIS — L723 Sebaceous cyst: Secondary | ICD-10-CM | POA: Diagnosis not present

## 2021-08-08 ENCOUNTER — Telehealth: Payer: Self-pay

## 2021-08-08 NOTE — Telephone Encounter (Signed)
PA for [facility] has been submitted on covermymeds.

## 2021-08-09 ENCOUNTER — Other Ambulatory Visit: Payer: Self-pay

## 2021-08-10 ENCOUNTER — Other Ambulatory Visit: Payer: Self-pay

## 2021-08-11 ENCOUNTER — Other Ambulatory Visit: Payer: Self-pay

## 2021-08-28 ENCOUNTER — Other Ambulatory Visit: Payer: Self-pay

## 2021-08-28 ENCOUNTER — Encounter: Payer: Self-pay | Admitting: Internal Medicine

## 2021-08-30 ENCOUNTER — Other Ambulatory Visit: Payer: Self-pay

## 2021-08-30 MED ORDER — SEMAGLUTIDE-WEIGHT MANAGEMENT 1 MG/0.5ML ~~LOC~~ SOAJ
1.0000 mg | SUBCUTANEOUS | 1 refills | Status: DC
  Filled 2021-08-30: qty 2, fill #0
  Filled 2021-09-28: qty 2, 28d supply, fill #0

## 2021-08-30 MED ORDER — SEMAGLUTIDE-WEIGHT MANAGEMENT 0.5 MG/0.5ML ~~LOC~~ SOAJ
0.5000 mg | SUBCUTANEOUS | 1 refills | Status: AC
  Filled 2021-08-30: qty 2, 28d supply, fill #0

## 2021-08-31 ENCOUNTER — Other Ambulatory Visit: Payer: Self-pay

## 2021-09-04 ENCOUNTER — Telehealth (INDEPENDENT_AMBULATORY_CARE_PROVIDER_SITE_OTHER): Payer: 59 | Admitting: Adult Health

## 2021-09-04 ENCOUNTER — Encounter: Payer: Self-pay | Admitting: Adult Health

## 2021-09-04 VITALS — Ht 67.0 in | Wt 207.0 lb

## 2021-09-04 DIAGNOSIS — M62830 Muscle spasm of back: Secondary | ICD-10-CM | POA: Insufficient documentation

## 2021-09-04 MED ORDER — BACLOFEN 10 MG PO TABS: 10.0000 mg | ORAL_TABLET | Freq: Two times a day (BID) | ORAL | 0 refills | Status: DC

## 2021-09-04 MED ORDER — PREDNISONE 10 MG (21) PO TBPK
ORAL_TABLET | ORAL | 0 refills | Status: DC
Start: 2021-09-04 — End: 2021-11-15

## 2021-09-04 NOTE — Progress Notes (Signed)
? ?  Acute Office Visit ? ? ? ? ?Virtual Visit via Video Note ? ?I connected with Alicia Blair on 09/05/21 at  3:30 PM EDT by a video enabled telemedicine application and verified that I am speaking with the correct person using two identifiers. ? ?Location: ?Patient: at home  ?Provider: Provider: Provider's office at  [facility], [place]. ?  ? ?  ?I discussed the limitations of evaluation and management by telemedicine and the availability of in person appointments. The patient expressed understanding and agreed to proceed. ? ?History of Present Illness: ? ? Patient is a 46 year old female in no acute distress, she went upstairs yesterday and played with her daughter sitting [place] style for hours and then cleaned out daughters closet. She reports when she got up she started with back muscle spasms below shoulder blades on both sides of her back. She has been using Ibuprofen.  ?She was just started on Wegovy. She is uncomfortable in shoulders being tight, when she relaxes her shoulders she feels she can reproduce muscle pain and when she hit a bump in her car she felt the same thing. Did not sleep well due to turning would cause catch. She points to area of mid thoracic area.  ?She got up and used heating pad and got some.  ?Denies any worsening reflux. Denies any nausea.  ?Denies any injury.  ?Denies any changes in her stool patterns. She takes Miralax.  ?Denies any chest pain.  ?Denies abdominal pain.  ? ?Patient  denies any fever, chills, rash, chest pain, shortness of breath, nausea, vomiting, or diarrhea.  Denies any abdominal pain or edema.  ?Denies dizziness, lightheadedness, pre syncopal or syncopal episodes.   ? ?Observations/Objective: ? ? ?Patient is alert and oriented and responsive to questions Engages in conversation with provider. Speaks in full sentences without any pauses without any shortness of breath or distress.   ? ?Assessment and Plan: ? ? ?Spasm of  muscle, back - Plan: predniSONE (STERAPRED UNI-PAK 21 TAB) 10 MG (21) TBPK tablet, baclofen (LIORESAL) 10 MG tablet  ? ?Follow Up Instructions: ? ? ?Advised in person evaluation at anytime is advised if any symptoms do not improve, worsen or change at any given time.  ?Red Flags discussed. The patient was given clear instructions to go to ER or return to medical center if any red flags develop, symptoms do not improve, worsen or new problems develop. They verbalized understanding.  ?  ?I discussed the assessment and treatment plan with the patient. The patient was provided an opportunity to ask questions and all were answered. The patient agreed with the plan and demonstrated an understanding of the instructions. ?  ?The patient was advised to call back or seek an in-person evaluation if the symptoms worsen or if the condition fails to improve as anticipated. ? ?I provided 20 minutes of non-face-to-face time during this encounter. ?Return in about 1 week (around 09/11/2021), or if symptoms worsen or fail to improve, for at any time for any worsening symptoms, Go to Emergency room/ urgent care if worse.  ? ?Marcille Buffy, FNP  ? ? ? ?Marcille Buffy, FNP ? ?

## 2021-09-04 NOTE — Patient Instructions (Signed)
Prednisolone Tablets ?What is this medication? ?PREDNISOLONE (pred NISS oh lone) treats many conditions such as asthma, allergic reactions, arthritis, inflammatory bowel diseases, adrenal, and blood or bone marrow disorders. It works by decreasing inflammation, slowing down an overactive immune system, or replacing cortisol normally made in the body. Cortisol is a hormone that plays an important role in how the body responds to stress, illness, and injury. It belongs to a group of medications called steroids. ?This medicine may be used for other purposes; ask your health care provider or pharmacist if you have questions. ?COMMON BRAND NAME(S): Millipred, Millipred DP, Millipred DP 12-Day, Millipred DP 6 Day, Prednoral ?What should I tell my care team before I take this medication? ?They need to know if you have any of these conditions: ?Cushing's syndrome ?Diabetes ?Glaucoma ?Heart problems or disease ?High blood pressure ?Infection such as herpes, measles, tuberculosis, or chickenpox ?Kidney disease ?Liver disease ?Mental problems ?Myasthenia gravis ?Osteoporosis ?Seizures ?Stomach ulcer or intestine disease including colitis and diverticulitis ?Thyroid problem ?An unusual or allergic reaction to lactose, prednisolone, other medications, foods, dyes, or preservatives ?Pregnant or trying to get pregnant ?Breast-feeding ?How should I use this medication? ?Take this medication by mouth with a glass of water. Follow the directions on the prescription label. Take it with food or milk to avoid stomach upset. If you are taking this medication once a day, take it in the morning. Do not take more medication than you are told to take. Do not suddenly stop taking your medication because you may develop a severe reaction. Your care team will tell you how much medication to take. If your care team wants you to stop the medication, the dose may be slowly lowered over time to avoid any side effects. ?Talk to your care team about  the use of this medication in children. Special care may be needed. ?Overdosage: If you think you have taken too much of this medicine contact a poison control center or emergency room at once. ?NOTE: This medicine is only for you. Do not share this medicine with others. ?What if I miss a dose? ?If you miss a dose, take it as soon as you can. If it is almost time for your next dose, take only that dose. Do not take double or extra doses. ?What may interact with this medication? ?Do not take this medication with any of the following: ?Metyrapone ?Mifepristone ?This medication may also interact with the following: ?Aminoglutethimide ?Amphotericin B ?Aspirin and aspirin-like medications ?Barbiturates ?Certain medications for diabetes, like glipizide or glyburide ?Cholestyramine ?Cholinesterase inhibitors ?Cyclosporine ?Digoxin ?Diuretics ?Ephedrine ?Female hormones, like estrogens and birth control pills ?Isoniazid ?Ketoconazole ?NSAIDS, medications for pain and inflammation, like ibuprofen or naproxen ?Phenytoin ?Rifampin ?Toxoids ?Vaccines ?Warfarin ?This list may not describe all possible interactions. Give your health care provider a list of all the medicines, herbs, non-prescription drugs, or dietary supplements you use. Also tell them if you smoke, drink alcohol, or use illegal drugs. Some items may interact with your medicine. ?What should I watch for while using this medication? ?Visit your care team for regular checks on your progress. If you are taking this medication over a prolonged period, carry an identification card with your name and address, the type and dose of your medication, and your care team's name and address. ?This medication may increase your risk of getting an infection. Tell your care team if you are around anyone with measles or chickenpox, or if you develop sores or blisters that do not heal properly. ?  If you are going to have surgery, tell your care team that you have taken this  medication within the last twelve months. ?Ask your care team about your diet. You may need to lower the amount of salt you eat. ?This medication may increase blood sugar. Ask your care team if changes in diet or medications are needed if you have diabetes. ?What side effects may I notice from receiving this medication? ?Side effects that you should report to your care team as soon as possible: ?Allergic reactions--skin rash, itching, hives, swelling of the face, lips, tongue, or throat ?Cushing syndrome--increased fat around the midsection, upper back, neck, or face, pink or purple stretch marks on the skin, thinning, fragile skin that easily bruises, unexpected hair growth ?High blood sugar (hyperglycemia)--increased thirst or amount of urine, unusual weakness or fatigue, blurry vision ?Increase in blood pressure ?Infection--fever, chills, cough, sore throat, wounds that don't heal, pain or trouble when passing urine, general feeling of discomfort or being unwell ?Low adrenal gland function--nausea, vomiting, loss of appetite, unusual weakness or fatigue, dizziness ?Mood and behavior changes--anxiety, nervousness, confusion, hallucinations, irritability, hostility, thoughts of suicide or self-harm, worsening mood, feelings of depression ?Stomach bleeding--bloody or black, tar-like stools, vomiting blood or brown material that looks like coffee grounds ?Swelling of the ankles, hands, or feet ?Side effects that usually do not require medical attention (report to your care team if they continue or are bothersome): ?Acne ?General discomfort and fatigue ?Headache ?Increase in appetite ?Nausea ?Trouble sleeping ?Weight gain ?This list may not describe all possible side effects. Call your doctor for medical advice about side effects. You may report side effects to FDA at 1-800-FDA-1088. ?Where should I keep my medication? ?Keep out of the reach of children. ?Store at room temperature between 15 and 30 degrees C (59 and  86 degrees F). Keep container tightly closed. Throw away any unused medication after the expiration date. ?NOTE: This sheet is a summary. It may not cover all possible information. If you have questions about this medicine, talk to your doctor, pharmacist, or health care provider. ?? 2022 [employer]/Gold Standard (2020-09-02 00:00:00) ?Baclofen Tablets ?What is this medication? ?BACLOFEN (BAK loe fen) treats muscle spasms. It works by relaxing your muscles, which reduces muscle stiffness. It belongs to a group of medications called muscle relaxants. ?This medicine may be used for other purposes; ask your health care provider or pharmacist if you have questions. ?COMMON BRAND NAME(S): ED Baclofen, Lioresal ?What should I tell my care team before I take this medication? ?They need to know if you have any of these conditions: ?High blood pressure ?History of stroke ?Kidney disease ?Mental health disease ?Ovarian cysts ?Seizures ?An unusual or allergic reaction to baclofen, other medications, foods, dyes or preservatives ?Pregnant or trying to get pregnant ?Breast-feeding ?How should I use this medication? ?Take this medication by mouth. Take it as directed on the prescription label. Keep taking it unless your care team tells you to stop. Stopping it too quickly can cause serious side effects. It can also make your condition worse. ?Talk to your care team about the use of this medication in children. While it may be prescribed for children as young as 12 years for selected conditions, precautions do apply. ?Overdosage: If you think you have taken too much of this medicine contact a poison control center or emergency room at once. ?NOTE: This medicine is only for you. Do not share this medicine with others. ?What if I miss a dose? ?If you miss a  dose, take it as soon as you can. If it is almost time for your next dose, take only that dose. Do not take double or extra doses. ?What may interact with this medication? ?Do not  take this medication with any of the following: ?Narcotic medications for cough ?This medication may also interact with the following: ?Alcohol ?Antihistamines for allergy, cough and cold ?Certain medications f

## 2021-09-04 NOTE — Progress Notes (Signed)
Back spasms after sitting in the floor with daughter. Her back felt that it was knotted up. Right under both of her scapulas & has progressively gotten worse & feels like a stabbing pain. She stated that this is definitely muscle related bc she has had back issues in the past with sciatica. Pt has taken ibuprofen every 4-6 hours. Pain has not let up since this happened yesterday.  ?

## 2021-09-28 ENCOUNTER — Encounter: Payer: Self-pay | Admitting: Internal Medicine

## 2021-09-28 ENCOUNTER — Other Ambulatory Visit: Payer: Self-pay

## 2021-09-29 ENCOUNTER — Ambulatory Visit (INDEPENDENT_AMBULATORY_CARE_PROVIDER_SITE_OTHER): Payer: 59 | Admitting: Vascular Surgery

## 2021-09-29 DIAGNOSIS — L538 Other specified erythematous conditions: Secondary | ICD-10-CM | POA: Diagnosis not present

## 2021-09-29 DIAGNOSIS — A630 Anogenital (venereal) warts: Secondary | ICD-10-CM | POA: Diagnosis not present

## 2021-09-29 DIAGNOSIS — R238 Other skin changes: Secondary | ICD-10-CM | POA: Diagnosis not present

## 2021-10-24 ENCOUNTER — Ambulatory Visit (INDEPENDENT_AMBULATORY_CARE_PROVIDER_SITE_OTHER): Payer: 59 | Admitting: Vascular Surgery

## 2021-10-27 ENCOUNTER — Ambulatory Visit (INDEPENDENT_AMBULATORY_CARE_PROVIDER_SITE_OTHER): Payer: 59 | Admitting: Vascular Surgery

## 2021-10-27 ENCOUNTER — Encounter (INDEPENDENT_AMBULATORY_CARE_PROVIDER_SITE_OTHER): Payer: Self-pay | Admitting: Vascular Surgery

## 2021-10-27 VITALS — BP 119/77 | HR 78 | Resp 17 | Ht 67.0 in | Wt 210.0 lb

## 2021-10-27 DIAGNOSIS — I890 Lymphedema, not elsewhere classified: Secondary | ICD-10-CM | POA: Diagnosis not present

## 2021-10-27 NOTE — Progress Notes (Signed)
? ? ?MRN : [id number] ? ?Alicia Blair is a 46 y.o. (May 04, 1976) female who presents with chief complaint of  ?Chief Complaint  ?Patient presents with  ? Follow-up  ?  6 mos no studies  ?. ? ?History of Present Illness: Patient returns today in follow up of her lymphedema.  We applied for a lymphedema pump at her last visit, but she never received this.  She continues to have prominent and problematic swelling as the day goes on.  In the mornings, the swelling is very minimal.  She has been diligently wearing her compression socks for almost a year now.  She elevates her legs.  She is exercising and much more active as well. ? ?Current Outpatient Medications  ?Medication Sig Dispense Refill  ? ferrous sulfate 325 (65 FE) MG tablet Take 325 mg by mouth daily.    ? Multiple Vitamin (MULTIVITAMIN) tablet Take 2 tablets by mouth daily.    ? omeprazole (PRILOSEC) 40 MG capsule Take 40 mg by mouth daily.    ? baclofen (LIORESAL) 10 MG tablet Take 1 tablet (10 mg total) by mouth 2 (two) times daily. May cause drowsiness. (Patient not taking: Reported on 10/27/2021) 20 each 0  ? predniSONE (STERAPRED UNI-PAK 21 TAB) 10 MG (21) TBPK tablet PO: Take 6 tablets on day 1:Take 5 tablets day 2:Take 4 tablets day 3: Take 3 tablets day 4:Take 2 tablets day five: Take 1 tablet day 6 (Patient not taking: Reported on 10/27/2021) 21 tablet 0  ? Semaglutide-Weight Management 1 MG/0.5ML SOAJ Inject 1 mg into the skin once a week for 28 days. (Patient not taking: Reported on 09/04/2021) 2 mL 1  ? ?No current facility-administered medications for this visit.  ? ? ?Past Medical History:  ?Diagnosis Date  ? Anemia   ? with pregnancy  ? Asthma   ? as a child  ? Chicken pox   ? Complication of anesthesia   ? PONV (postoperative nausea and vomiting)   ? ? ?Past Surgical History:  ?Procedure Laterality Date  ? breast augmentation  2003  ? HERNIA REPAIR    ? LAPAROSCOPIC GASTRIC BANDING  2008  ? WISDOM TOOTH EXTRACTION    ? ? ? ?Social History   ? ?Tobacco Use  ? Smoking status: Never  ? Smokeless tobacco: Never  ?Vaping Use  ? Vaping Use: Never used  ?Substance Use Topics  ? Alcohol use: Yes  ?  Alcohol/week: 3.0 standard drinks  ?  Types: 3 Cans of beer per week  ?  Comment: social, none since pregnanc y  ? Drug use: No  ? ? ? ? ?Family History  ?Problem Relation Age of Onset  ? Diabetes Mother   ? Cancer Mother 35  ?     endometrial CA  ? Atrial fibrillation Mother   ? Hypertension Mother   ? Hyperlipidemia Mother   ? Diabetes Maternal Aunt   ? Diabetes Maternal Grandmother   ? Cancer Brother 21  ?     testicular ca  ? Diabetes Brother   ? ? ? ?Allergies  ?Allergen Reactions  ? Codeine Nausea And Vomiting and Nausea Only  ? Erythromycin Nausea And Vomiting  ? ? ? ?REVIEW OF SYSTEMS (Negative unless checked) ? ?Constitutional: [] Weight loss  [] Fever  [] Chills ?Cardiac: [] Chest pain   [] Chest pressure   [] Palpitations   [] Shortness of breath when laying flat   [] Shortness of breath at rest   [] Shortness of breath with exertion. ?Vascular:  [] Pain in  legs with walking   [] Pain in legs at rest   [] Pain in legs when laying flat   [] Claudication   [] Pain in feet when walking  [] Pain in feet at rest  [] Pain in feet when laying flat   [] History of DVT   [] Phlebitis   [x] Swelling in legs   [] Varicose veins   [] Non-healing ulcers ?Pulmonary:   [] Uses home oxygen   [] Productive cough   [] Hemoptysis   [] Wheeze  [] COPD   [] Asthma ?Neurologic:  [] Dizziness  [] Blackouts   [] Seizures   [] History of stroke   [] History of TIA  [] Aphasia   [] Temporary blindness   [] Dysphagia   [] Weakness or numbness in arms   [] Weakness or numbness in legs ?Musculoskeletal:  [] Arthritis   [] Joint swelling   [] Joint pain   [] Low back pain ?Hematologic:  [] Easy bruising  [] Easy bleeding   [] Hypercoagulable state   [] Anemic   ?Gastrointestinal:  [] Blood in stool   [] Vomiting blood  [] Gastroesophageal reflux/heartburn   [] Abdominal pain ?Genitourinary:  [] Chronic kidney disease   [] Difficult  urination  [] Frequent urination  [] Burning with urination   [] Hematuria ?Skin:  [] Rashes   [] Ulcers   [] Wounds ?Psychological:  [] History of anxiety   []  History of major depression. ? ?Physical Examination ? ?BP 119/77 (BP Location: Left Arm)   Pulse 78   Resp 17   Ht 5' 7"  (1.702 m)   Wt 210 lb (95.3 kg)   BMI 32.89 kg/m?  ?Gen:  WD/WN, NAD ?Head: [place]/AT, No temporalis wasting. ?Ear/Nose/Throat: Hearing grossly intact, nares w/o erythema or drainage ?Eyes: Conjunctiva clear. Sclera non-icteric ?Neck: Supple.  Trachea midline ?Pulmonary:  Good air movement, no use of accessory muscles.  ?Cardiac: RRR, no JVD ?Vascular:  ?Vessel Right Left  ?Radial Palpable Palpable  ?    ?    ?    ?    ?    ?    ?PT Palpable Palpable  ?DP Palpable Palpable  ? ? ?Musculoskeletal: M/S 5/5 throughout.  No deformity or atrophy. No appreciable edema. ?Neurologic: Sensation grossly intact in extremities.  Symmetrical.  Speech is fluent.  ?Psychiatric: Judgment intact, Mood & affect appropriate for pt's clinical situation. ?Dermatologic: No rashes or ulcers noted.  No cellulitis or open wounds. ? ? ? ? ? ?Labs ?No results found for this or any previous visit (from the past 2160 hour(s)). ? ?Radiology ?No results found. ? ?Assessment/Plan ?Lymphedema ?Her reflux study previously shows no evidence of DVT or superficial thrombophlebitis.  No significant venous reflux is seen in either lower extremity.  She did have significant Baker cysts in both popliteal spaces. ?The patient appears to have stage II lymphedema with swelling refractory to compression and elevation.  In addition to these typical lifestyle behavior modifications which she has been doing for many months now, a lymphedema pump would be an excellent adjuvant therapy to improve her pain and swelling.  I will try to get that approved for her at this point.  I am not sure why we were unable to get this last time.  She will remain active as well.  I will plan on seeing her back  in 6 months.  I am also going to refer her for an echocardiogram just to ensure that she does not have significant cardiac dysfunction particularly diastolic dysfunction that may be worsening her lower extremity swelling ? ? ? ? ?Leotis Pain, MD ? ?10/27/2021 ?9:45 AM ? ? ? ?This note was created with Dragon medical transcription system.  Any  errors from dictation are purely unintentional  ? ?

## 2021-10-30 ENCOUNTER — Other Ambulatory Visit: Payer: 59

## 2021-11-01 DIAGNOSIS — L720 Epidermal cyst: Secondary | ICD-10-CM | POA: Diagnosis not present

## 2021-11-01 DIAGNOSIS — N898 Other specified noninflammatory disorders of vagina: Secondary | ICD-10-CM | POA: Diagnosis not present

## 2021-11-14 ENCOUNTER — Encounter: Payer: Self-pay | Admitting: Internal Medicine

## 2021-11-14 ENCOUNTER — Other Ambulatory Visit: Payer: Self-pay

## 2021-11-14 MED ORDER — OMEPRAZOLE 40 MG PO CPDR
40.0000 mg | DELAYED_RELEASE_CAPSULE | Freq: Every day | ORAL | 12 refills | Status: DC
  Filled 2021-11-14: qty 90, 90d supply, fill #0

## 2021-11-15 ENCOUNTER — Ambulatory Visit: Payer: 59 | Admitting: Family

## 2021-11-15 ENCOUNTER — Other Ambulatory Visit: Payer: Self-pay

## 2021-11-15 ENCOUNTER — Ambulatory Visit: Payer: 59 | Admitting: Family Medicine

## 2021-11-15 ENCOUNTER — Encounter: Payer: Self-pay | Admitting: Family

## 2021-11-15 VITALS — BP 116/78 | HR 61 | Temp 98.6°F | Ht 67.0 in | Wt 208.8 lb

## 2021-11-15 DIAGNOSIS — R1013 Epigastric pain: Secondary | ICD-10-CM | POA: Diagnosis not present

## 2021-11-15 MED ORDER — OMEPRAZOLE 40 MG PO CPDR
40.0000 mg | DELAYED_RELEASE_CAPSULE | Freq: Every day | ORAL | 12 refills | Status: DC
  Filled 2021-12-29: qty 90, 90d supply, fill #0

## 2021-11-15 NOTE — Progress Notes (Signed)
Began on Tuesday.Burning sensation in neck. In back and front of ears No chest pain now. Patient stated that she has hadheartburn but never this bad before

## 2021-11-15 NOTE — Assessment & Plan Note (Addendum)
Symptom has completely resolved.  EKG shows sinus rhythm .  No previous EKG to compare too.   She has been taking omeprazole 40 mg once daily with resolution.  Benign HEENT exam had long discussion that radiation towards bilateral ears during episode may be an atypical symptom of GERD.  She does not have hearing loss, tinnitus or vertigo.  No symptoms to suggest TMJ at this time.  Patient may continue omeprazole 40 mg for the next 6 to 8 weeks and advise she may adjunct with Pepcid OTC 20 mg nightly.  Counseled on long-term side effects of PPIs.  Counseled her on the avoidance of caffeine, alcohol.  We did discuss cholelithiasis.  If symptoms would recur, would recommend right upper quadrant ultrasound.  If ear pain were to continue despite escalation of antacid, would recommend ENT consult.  Patient let me know how she is doing.

## 2021-11-15 NOTE — Telephone Encounter (Signed)
I do apologize as I did not realize Dr. Caryl Bis was still virtual only. I called patient back & she did not want to go to UC or ED bc she said that she knows she may be sent ED that will cost her $10,000. She stated she does have that kind of money. I explained that was advised bc even tough she thinks is GERD we have no way of knowing this for sure. She is scheduled tomorrow morning with Dr. Olivia Mackie at 9:20.

## 2021-11-15 NOTE — Patient Instructions (Signed)
You may continue omeprazole  ( Prilosec) 40 mg taken 1 hour before breakfast every day.  As discussed, ultimately we would want you to wean off this medication in approximately 6 to 8-weeks.     You may also start over-the-counter Pepcid AC 20 mg taken at bedtime.  Please let me know if symptoms do not completely resolve and most certainly recur or if new symptoms develop.  Long term use beyond 3 months of proton pump inhibitors ( PPI's) include Nexium, Prilosec, Protonix, Dexilant, and Prevacid.  Some medical studies have identified an association between the long-term use of PPIs and the development of numerous adverse conditions including intestinal infections, pneumonia, stomach cancer, osteoporosis-related bone fractures, chronic kidney disease, deficiencies of certain vitamins and minerals, heart attacks, strokes, dementia, and early death. Those studies have flaws, are not considered definitive, and do not establish a cause-and-effect relationship between PPIs and the adverse conditions. High-quality studies have found that PPIs do not significantly increase the risk of any of these conditions except intestinal infections. Nevertheless, we cannot exclude the possibility that PPIs might confer a small increase in the risk of developing these adverse conditions. For the treatment of GERD, gastroenterologists generally agree that the well-established benefits of PPIs far outweigh their theoretical risks.  Nonetheless I recommend trial wean off of PPI's  if uncomplicated acid reflux and use of histamine 2 blocker ( Pepcid AC).   Patients with history of esophagitis  or Barrett's esophagus should remain on PPI.   I generally recommend trying to control acid reflux with lifestyle modifications including avoiding trigger foods, not eating 2 hours prior to bedtime. You may use histamine 2 blockers daily to twice daily ( this is Pepcid) and then when symptoms flare, start back on PPI for short course.    Of note, we will need to do an endoscopy ( upper GI) to evaluate your esophagus, stomach in the future if acid reflux persists are you develop red flag symptoms: trouble swallowing, hoarseness, chronic cough, unexplained weight loss.    Food Choices for Gastroesophageal Reflux Disease, Adult When you have gastroesophageal reflux disease (GERD), the foods you eat and your eating habits are very important. Choosing the right foods can help ease the discomfort of GERD. Consider working with a dietitian to help you make healthy food choices. What are tips for following this plan? Reading food labels Look for foods that are low in saturated fat. Foods that have less than 5% of daily value (DV) of fat and 0 g of trans fats may help with your symptoms. Cooking Cook foods using methods other than frying. This may include baking, steaming, grilling, or broiling. These are all methods that do not need a lot of fat for cooking. To add flavor, try to use herbs that are low in spice and acidity. Meal planning  Choose healthy foods that are low in fat, such as fruits, vegetables, whole grains, low-fat dairy products, lean meats, fish, and poultry. Eat frequent, small meals instead of three large meals each day. Eat your meals slowly, in a relaxed setting. Avoid bending over or lying down until 2-3 hours after eating. Limit high-fat foods such as fatty meats or fried foods. Limit your intake of fatty foods, such as oils, butter, and shortening. Avoid the following as told by your health care provider: Foods that cause symptoms. These may be different for different people. Keep a food diary to keep track of foods that cause symptoms. Alcohol. Drinking large amounts of liquid with  meals. Eating meals during the 2-3 hours before bed. Lifestyle Maintain a healthy weight. Ask your health care provider what weight is healthy for you. If you need to lose weight, work with your health care provider to do so  safely. Exercise for at least 30 minutes on 5 or more days each week, or as told by your health care provider. Avoid wearing clothes that fit tightly around your waist and chest. Do not use any products that contain nicotine or tobacco. These products include cigarettes, chewing tobacco, and vaping devices, such as e-cigarettes. If you need help quitting, ask your health care provider. Sleep with the head of your bed raised. Use a wedge under the mattress or blocks under the bed frame to raise the head of the bed. Chew sugar-free gum after mealtimes. What foods should I eat?  Eat a healthy, well-balanced diet of fruits, vegetables, whole grains, low-fat dairy products, lean meats, fish, and poultry. Each person is different. Foods that may trigger symptoms in one person may not trigger any symptoms in another person. Work with your health care provider to identify foods that are safe for you. The items listed above may not be a complete list of recommended foods and beverages. Contact a dietitian for more information. What foods should I avoid? Limiting some of these foods may help manage the symptoms of GERD. Everyone is different. Consult a dietitian or your health care provider to help you identify the exact foods to avoid, if any. Fruits Any fruits prepared with added fat. Any fruits that cause symptoms. For some people this may include citrus fruits, such as oranges, grapefruit, pineapple, and lemons. Vegetables Deep-fried vegetables. [place] fries. Any vegetables prepared with added fat. Any vegetables that cause symptoms. For some people, this may include tomatoes and tomato products, chili peppers, onions and garlic, and horseradish. Grains Pastries or quick breads with added fat. Meats and other proteins High-fat meats, such as fatty beef or pork, hot dogs, ribs, ham, sausage, salami, and bacon. Fried meat or protein, including fried fish and fried chicken. Nuts and nut butters, in large  amounts. Dairy Whole milk and chocolate milk. Sour cream. Cream. Ice cream. Cream cheese. Milkshakes. Fats and oils Butter. Margarine. Shortening. Ghee. Beverages Coffee and tea, with or without caffeine. Carbonated beverages. Sodas. Energy drinks. Fruit juice made with acidic fruits, such as orange or grapefruit. Tomato juice. Alcoholic drinks. Sweets and desserts Chocolate and cocoa. Donuts. Seasonings and condiments Pepper. Peppermint and spearmint. Added salt. Any condiments, herbs, or seasonings that cause symptoms. For some people, this may include curry, hot sauce, or vinegar-based salad dressings. The items listed above may not be a complete list of foods and beverages to avoid. Contact a dietitian for more information. Questions to ask your health care provider Diet and lifestyle changes are usually the first steps that are taken to manage symptoms of GERD. If diet and lifestyle changes do not improve your symptoms, talk with your health care provider about taking medicines. Where to find more information International Foundation for Gastrointestinal Disorders: aboutgerd.org Summary When you have gastroesophageal reflux disease (GERD), food and lifestyle choices may be very helpful in easing the discomfort of GERD. Eat frequent, small meals instead of three large meals each day. Eat your meals slowly, in a relaxed setting. Avoid bending over or lying down until 2-3 hours after eating. Limit high-fat foods such as fatty meats or fried foods. This information is not intended to replace advice given to you by your health  care provider. Make sure you discuss any questions you have with your health care provider. Document Revised: 12/14/2019 Document Reviewed: 12/14/2019 Elsevier Patient Education  [address].

## 2021-11-15 NOTE — Progress Notes (Signed)
Subjective:    Patient ID: Alicia Blair, female    DOB: [birth year], 46 y.o.   MRN: [id number]  CC: Alicia Blair is a 46 y.o. female who presents today for an acute visit.    HPI: Complains of epigastric burning  Started to have painful burn across chest pain across left and right side of chest. Pain radiates to her ears which is typical 'for me'. She had eaten red sauce on pasta for dinner. She had had beer that day as well over [facility] She had spicy burger for lunch earlier than day.  No nsaid use.  She tried mylanta, omeprazole. This episode occurred at 6pm 3 days ago and subsided early the following morning after sitting straight up 2 days ago.  She has started otc omeprazole 76m yesterday with zantac yesterday. She took omeprazole 490mtoday with complete relief.   She is burping and nauseated intermittently. She hasnt been drinking or eating the past couple of days as worried pain would recur.   No sob, left arm numbness, constipation, vertigo, congestion, tinnitus, or hearing loss.  No pain with chewing, clicking of jaw. H/o hiatal hernia and s/p repair  She had h/o lab band surgery and band was taken out 2022.   She is RNTherapist, sports  She goes to the gym daily     Never smoker Cologuard negative 6 months ago HISTORY:  Past Medical History:  Diagnosis Date   Anemia    with pregnancy   Asthma    as a child   Chicken pox    Complication of anesthesia    PONV (postoperative nausea and vomiting)    Past Surgical History:  Procedure Laterality Date   breast augmentation  2003   HERNIA REPAIR     LAPAROSCOPIC GASTRIC BANDING  2008   WISDOM TOOTH EXTRACTION     Family History  Problem Relation Age of Onset   Diabetes Mother    Cancer Mother [id number]     endometrial CA   Atrial fibrillation Mother    Hypertension Mother    Hyperlipidemia Mother    Diabetes Maternal Aunt    Diabetes Maternal Grandmother    Cancer Brother 21       testicular ca   Diabetes  Brother     Allergies: Codeine and Erythromycin Current Outpatient Medications on File Prior to Visit  Medication Sig Dispense Refill   baclofen (LIORESAL) 10 MG tablet Take 1 tablet (10 mg total) by mouth 2 (two) times daily. May cause drowsiness. (Patient not taking: Reported on 10/27/2021) 20 each 0   Multiple Vitamin (MULTIVITAMIN) tablet Take 2 tablets by mouth daily.     No current facility-administered medications on file prior to visit.    Social History   Tobacco Use   Smoking status: Never   Smokeless tobacco: Never  Vaping Use   Vaping Use: Never used  Substance Use Topics   Alcohol use: Yes    Alcohol/week: 3.0 standard drinks    Types: 3 Cans of beer per week    Comment: social, none since pregnanc y   Drug use: No    Review of Systems  Constitutional:  Negative for chills and fever.  HENT:  Positive for ear pain. Negative for congestion, ear discharge, hearing loss and tinnitus.   Respiratory:  Negative for cough, chest tightness and shortness of breath.   Cardiovascular:  Positive for chest pain. Negative for palpitations.  Gastrointestinal:  Positive for nausea. Negative for  abdominal distention, abdominal pain and vomiting.     Objective:    BP 116/78 (BP Location: Left Arm, Patient Position: Sitting, Cuff Size: Normal)   Pulse 61   Temp 98.6 F (37 C) (Oral)   Ht 5' 7"  (1.702 m)   Wt 208 lb 12.8 oz (94.7 kg)   LMP  (LMP Unknown)   SpO2 97%   BMI 32.70 kg/m    Physical Exam Vitals reviewed.  Constitutional:      Appearance: Normal appearance. She is well-developed.  HENT:     Head: Normocephalic and atraumatic.     Right Ear: Hearing, tympanic membrane, ear canal and external ear normal. No decreased hearing noted. No drainage, swelling or tenderness. No middle ear effusion. No foreign body. Tympanic membrane is not erythematous or bulging.     Left Ear: Hearing, tympanic membrane, ear canal and external ear normal. No decreased hearing noted. No  drainage, swelling or tenderness.  No middle ear effusion. No foreign body. Tympanic membrane is not erythematous or bulging.     Nose: Nose normal. No rhinorrhea.     Right Sinus: No maxillary sinus tenderness or frontal sinus tenderness.     Left Sinus: No maxillary sinus tenderness or frontal sinus tenderness.     Mouth/Throat:     Pharynx: Uvula midline. No oropharyngeal exudate or posterior oropharyngeal erythema.     Tonsils: No tonsillar abscesses.     Comments: No pain with opening or moving jaw side to side. Eyes:     Conjunctiva/sclera: Conjunctivae normal.  Cardiovascular:     Rate and Rhythm: Normal rate and regular rhythm.     Pulses: Normal pulses.     Heart sounds: Normal heart sounds.     Comments: No carotid bruit. No chest pain with deep palpation across chest Pulmonary:     Effort: Pulmonary effort is normal.     Breath sounds: Normal breath sounds. No wheezing, rhonchi or rales.  Abdominal:     General: Bowel sounds are normal. There is no distension.     Palpations: Abdomen is soft. Abdomen is not rigid. There is no fluid wave or mass.     Tenderness: There is no abdominal tenderness. There is no guarding or rebound.  Lymphadenopathy:     Head:     Right side of head: No submental, submandibular, tonsillar, preauricular, posterior auricular or occipital adenopathy.     Left side of head: No submental, submandibular, tonsillar, preauricular, posterior auricular or occipital adenopathy.     Cervical: No cervical adenopathy.  Skin:    General: Skin is warm and dry.  Neurological:     Mental Status: She is alert.  Psychiatric:        Speech: Speech normal.        Behavior: Behavior normal.        Thought Content: Thought content normal.       Assessment & Plan:    Problem List Items Addressed This Visit       Other   Epigastric pain - Primary    Symptom has completely resolved.  EKG shows sinus rhythm .  No previous EKG to compare too.   She has been  taking omeprazole 40 mg once daily with resolution.  Benign HEENT exam had long discussion that radiation towards bilateral ears during episode may be an atypical symptom of GERD.  She does not have hearing loss, tinnitus or vertigo.  No symptoms to suggest TMJ at this time.  Patient may continue  omeprazole 40 mg for the next 6 to 8 weeks and advise she may adjunct with Pepcid OTC 20 mg nightly.  Counseled on long-term side effects of PPIs.  Counseled her on the avoidance of caffeine, alcohol.  We did discuss cholelithiasis.  If symptoms would recur, would recommend right upper quadrant ultrasound.  If your pain were to continue despite escalation of antacid, would recommend ENT consult.  Patient let me know how she is doing.       Relevant Orders   Comprehensive metabolic panel   CBC with Differential/Platelet   EKG 12-Lead (Completed)     I have discontinued Carita Pian. Teehan's ferrous sulfate, Semaglutide-Weight Management, and predniSONE. I am also having her maintain her multivitamin and baclofen.   No orders of the defined types were placed in this encounter.   Return precautions given.   Risks, benefits, and alternatives of the medications and treatment plan prescribed today were discussed, and patient expressed understanding.   Education regarding symptom management and diagnosis given to patient on AVS.  Continue to follow with Crecencio Mc, MD for routine health maintenance.   [facility] and I agreed with plan.   Mable Paris, FNP

## 2021-11-16 ENCOUNTER — Ambulatory Visit: Payer: 59 | Admitting: Internal Medicine

## 2021-11-16 LAB — CBC WITH DIFFERENTIAL/PLATELET
Basophils Absolute: 0.1 10*3/uL (ref 0.0–0.1)
Basophils Relative: 1 % (ref 0.0–3.0)
Eosinophils Absolute: 0.3 10*3/uL (ref 0.0–0.7)
Eosinophils Relative: 3.6 % (ref 0.0–5.0)
HCT: 34.1 % — ABNORMAL LOW (ref 36.0–46.0)
Hemoglobin: 11.1 g/dL — ABNORMAL LOW (ref 12.0–15.0)
Lymphocytes Relative: 24.6 % (ref 12.0–46.0)
Lymphs Abs: 1.7 10*3/uL (ref 0.7–4.0)
MCHC: 32.5 g/dL (ref 30.0–36.0)
MCV: 83.8 fl (ref 78.0–100.0)
Monocytes Absolute: 0.6 10*3/uL (ref 0.1–1.0)
Monocytes Relative: 8.8 % (ref 3.0–12.0)
Neutro Abs: 4.3 10*3/uL (ref 1.4–7.7)
Neutrophils Relative %: 62 % (ref 43.0–77.0)
Platelets: 270 10*3/uL (ref 150.0–400.0)
RBC: 4.08 Mil/uL (ref 3.87–5.11)
RDW: 15.3 % (ref 11.5–15.5)
WBC: 6.9 10*3/uL (ref 4.0–10.5)

## 2021-11-16 LAB — COMPREHENSIVE METABOLIC PANEL
ALT: 11 U/L (ref 0–35)
AST: 16 U/L (ref 0–37)
Albumin: 4.2 g/dL (ref 3.5–5.2)
Alkaline Phosphatase: 37 U/L — ABNORMAL LOW (ref 39–117)
BUN: 11 mg/dL (ref 6–23)
CO2: 25 mEq/L (ref 19–32)
Calcium: 9.4 mg/dL (ref 8.4–10.5)
Chloride: 103 mEq/L (ref 96–112)
Creatinine, Ser: 0.71 mg/dL (ref 0.40–1.20)
GFR: 102.36 mL/min (ref >60.00)
Glucose, Bld: 80 mg/dL (ref 70–99)
Potassium: 3.7 mEq/L (ref 3.5–5.1)
Sodium: 138 mEq/L (ref 135–145)
Total Bilirubin: 0.3 mg/dL (ref 0.2–1.2)
Total Protein: 7 g/dL (ref 6.0–8.3)

## 2021-11-20 ENCOUNTER — Other Ambulatory Visit: Payer: Self-pay

## 2021-11-21 ENCOUNTER — Other Ambulatory Visit: Payer: Self-pay | Admitting: Family

## 2021-11-21 DIAGNOSIS — D649 Anemia, unspecified: Secondary | ICD-10-CM

## 2021-12-03 NOTE — Progress Notes (Unsigned)
Cardiology Office Note  Date:  12/04/2021   ID:  Alicia Blair, DOB [birth year], MRN [id number]  PCP:  Crecencio Mc, MD   Chief Complaint  Patient presents with   New Patient (Initial Visit)    Ref by Dr. Lucky Cowboy for lymphedema. Patient c/o bilateral LE edema with more on the right leg. Medications reviewed by the patient verbally.     HPI:  [name] is a 46 yo woman with PMH of No extremity edema Who presents by referral from Dr. Leotis Pain for consultation of her lymphedema  Notes reviewed from Dr. Lucky Cowboy Referred for echo given leg swelling  Reports that she has had evaluation for lower edema, diagnosed with lymphedema by vascular  Previously tried HCTZ/furosemide in the past, very briefly for select days at a time but not consistently  In office, wears compression hose but not daily  On some days, swelling much worse than other days Trigger for worsening swelling unclear, comes and goes Has been denied lymphedema compression pumps twice  EKG personally reviewed by myself showing normal sinus rhythm no significant ST-T wave changes, performed several weeks ago   PMH:   has a past medical history of Anemia, Asthma, Chicken pox, Complication of anesthesia, and PONV (postoperative nausea and vomiting).  PSH:    Past Surgical History:  Procedure Laterality Date   breast augmentation  2003   HERNIA REPAIR     LAPAROSCOPIC GASTRIC BANDING  2008   WISDOM TOOTH EXTRACTION      Current Outpatient Medications  Medication Sig Dispense Refill   clobetasol cream (TEMOVATE) 2.20 % Apply 1 Application topically 2 (two) times daily.     Multiple Vitamin (MULTIVITAMIN) tablet Take 2 tablets by mouth daily.     omeprazole (PRILOSEC) 40 MG capsule Take 1 capsule (40 mg total) by mouth daily. 90 capsule 12   baclofen (LIORESAL) 10 MG tablet Take 1 tablet (10 mg total) by mouth 2 (two) times daily. May cause drowsiness. (Patient not taking: Reported on 10/27/2021) 20 each 0   No  current facility-administered medications for this visit.     Allergies:   Codeine and Erythromycin   Social History:  The patient  reports that she has never smoked. She has never used smokeless tobacco. She reports current alcohol use of about 3.0 standard drinks of alcohol per week. She reports that she does not use drugs.   Family History:   family history includes Atrial fibrillation in her mother; Cancer (age of onset: 9) in her brother; Cancer (age of onset: 79) in her mother; Diabetes in her brother, maternal aunt, maternal grandmother, and mother; Hyperlipidemia in her mother; Hypertension in her mother.    Review of Systems: Review of Systems  Constitutional: Negative.   HENT: Negative.    Respiratory: Negative.    Cardiovascular: Negative.   Gastrointestinal: Negative.   Musculoskeletal: Negative.   Neurological: Negative.   Psychiatric/Behavioral: Negative.    All other systems reviewed and are negative.    PHYSICAL EXAM: VS:  BP 120/84 (BP Location: Right Arm, Patient Position: Sitting, Cuff Size: Normal)   Pulse 81   Ht 5' 7"  (1.702 m)   Wt 211 lb 4 oz (95.8 kg)   LMP  (LMP Unknown)   SpO2 98%   BMI 33.09 kg/m  , BMI Body mass index is 33.09 kg/m. GEN: Well nourished, well developed, in no acute distress HEENT: normal Neck: no JVD, carotid bruits, or masses Cardiac: RRR; no murmurs, rubs, or gallops,no  edema  Respiratory:  clear to auscultation bilaterally, normal work of breathing GI: soft, nontender, nondistended, + BS MS: no deformity or atrophy Skin: warm and dry, no rash Neuro:  Strength and sensation are intact Psych: euthymic mood, full affect   Recent Labs: 02/23/2021: TSH 3.33 11/15/2021: ALT 11; BUN 11; Creatinine, Ser 0.71; Hemoglobin 11.1; Platelets 270.0; Potassium 3.7; Sodium 138    Lipid Panel Lab Results  Component Value Date   CHOL 222 (H) 05/03/2021   HDL 62.50 05/03/2021   LDLCALC 144 (H) 05/03/2021   TRIG 76.0 05/03/2021       Wt Readings from Last 3 Encounters:  12/04/21 211 lb 4 oz (95.8 kg)  11/15/21 208 lb 12.8 oz (94.7 kg)  10/27/21 210 lb (95.3 kg)     ASSESSMENT AND PLAN:  Problem List Items Addressed This Visit     Lymphedema - Primary   Bilateral lower extremity edema   Lower extremity swelling Diagnosed with lymphedema by vascular Strategies discussed including weight loss, moderation of oral fluids Echocardiogram ordered to rule out pulmonary hypertension, diastolic dysfunction Stressed importance of continued weight loss efforts After echocardiogram complete, could attempt a trial of Lasix for symptom relief with close monitoring of renal function  Obesity We have encouraged continued exercise, careful diet management in an effort to lose weight.   Total encounter time more than 60 minutes  Greater than 50% was spent in counseling and coordination of care with the patient    Signed, Esmond Plants, M.D., Ph.D. [place], [place]

## 2021-12-04 ENCOUNTER — Encounter: Payer: Self-pay | Admitting: Cardiovascular Disease

## 2021-12-04 ENCOUNTER — Ambulatory Visit: Payer: 59 | Admitting: Cardiovascular Disease

## 2021-12-04 VITALS — BP 120/84 | HR 81 | Ht 67.0 in | Wt 211.2 lb

## 2021-12-04 DIAGNOSIS — E6609 Other obesity due to excess calories: Secondary | ICD-10-CM | POA: Diagnosis not present

## 2021-12-04 DIAGNOSIS — Z6833 Body mass index (BMI) 33.0-33.9, adult: Secondary | ICD-10-CM | POA: Diagnosis not present

## 2021-12-04 DIAGNOSIS — R600 Localized edema: Secondary | ICD-10-CM

## 2021-12-04 DIAGNOSIS — I890 Lymphedema, not elsewhere classified: Secondary | ICD-10-CM

## 2021-12-04 NOTE — Patient Instructions (Addendum)
Medication Instructions:  No changes  If you need a refill on your cardiac medications before your next appointment, please call your pharmacy.   Lab work: No new labs needed  Testing/Procedures:  Your physician has requested that you have an echocardiogram. Echocardiography is a painless test that uses sound waves to create images of your heart. It provides your doctor with information about the size and shape of your heart and how well your heart's chambers and valves are working. This procedure takes approximately one hour. There are no restrictions for this procedure.   Follow-Up: At [facility], you and your health needs are our priority.  As part of our continuing mission to provide you with exceptional heart care, we have created designated Provider Care Teams.  These Care Teams include your primary Cardiologist (physician) and Advanced Practice Providers (APPs -  Physician Assistants and Nurse Practitioners) who all work together to provide you with the care you need, when you need it.  You will need a follow up appointment as needed  Providers on your designated Care Team:   Murray Hodgkins, NP Christell Faith, PA-C Cadence Kathlen Mody, [place]  COVID-19 Vaccine Information can be found at: ShippingScam.co.uk For questions related to vaccine distribution or appointments, please email vaccine@[email or web address] .com or call [phone].

## 2021-12-20 ENCOUNTER — Other Ambulatory Visit (INDEPENDENT_AMBULATORY_CARE_PROVIDER_SITE_OTHER): Payer: 59

## 2021-12-20 DIAGNOSIS — R5383 Other fatigue: Secondary | ICD-10-CM | POA: Diagnosis not present

## 2021-12-20 DIAGNOSIS — D509 Iron deficiency anemia, unspecified: Secondary | ICD-10-CM | POA: Diagnosis not present

## 2021-12-20 DIAGNOSIS — E538 Deficiency of other specified B group vitamins: Secondary | ICD-10-CM

## 2021-12-20 DIAGNOSIS — R7303 Prediabetes: Secondary | ICD-10-CM

## 2021-12-20 LAB — COMPREHENSIVE METABOLIC PANEL
ALT: 9 U/L (ref 0–35)
AST: 13 U/L (ref 0–37)
Albumin: 4.3 g/dL (ref 3.5–5.2)
Alkaline Phosphatase: 45 U/L (ref 39–117)
BUN: 12 mg/dL (ref 6–23)
CO2: 23 mEq/L (ref 19–32)
Calcium: 8.9 mg/dL (ref 8.4–10.5)
Chloride: 104 mEq/L (ref 96–112)
Creatinine, Ser: 0.62 mg/dL (ref 0.40–1.20)
GFR: 107.13 mL/min (ref >60.00)
Glucose, Bld: 82 mg/dL (ref 70–99)
Potassium: 3.9 mEq/L (ref 3.5–5.1)
Sodium: 137 mEq/L (ref 135–145)
Total Bilirubin: 0.4 mg/dL (ref 0.2–1.2)
Total Protein: 6.5 g/dL (ref 6.0–8.3)

## 2021-12-20 LAB — CBC WITH DIFFERENTIAL/PLATELET
Basophils Absolute: 0 10*3/uL (ref 0.0–0.1)
Basophils Relative: 0.8 % (ref 0.0–3.0)
Eosinophils Absolute: 0.2 10*3/uL (ref 0.0–0.7)
Eosinophils Relative: 3.2 % (ref 0.0–5.0)
HCT: 36.2 % (ref 36.0–46.0)
Hemoglobin: 11.7 g/dL — ABNORMAL LOW (ref 12.0–15.0)
Lymphocytes Relative: 32.5 % (ref 12.0–46.0)
Lymphs Abs: 1.8 10*3/uL (ref 0.7–4.0)
MCHC: 32.2 g/dL (ref 30.0–36.0)
MCV: 82.5 fl (ref 78.0–100.0)
Monocytes Absolute: 0.5 10*3/uL (ref 0.1–1.0)
Monocytes Relative: 8.3 % (ref 3.0–12.0)
Neutro Abs: 3.1 10*3/uL (ref 1.4–7.7)
Neutrophils Relative %: 55.2 % (ref 43.0–77.0)
Platelets: 290 10*3/uL (ref 150.0–400.0)
RBC: 4.39 Mil/uL (ref 3.87–5.11)
RDW: 15.9 % — ABNORMAL HIGH (ref 11.5–15.5)
WBC: 5.6 10*3/uL (ref 4.0–10.5)

## 2021-12-20 LAB — LIPID PANEL
Cholesterol: 213 mg/dL — ABNORMAL HIGH (ref 0–200)
HDL: 61.6 mg/dL (ref >39.00)
LDL Cholesterol: 139 mg/dL — ABNORMAL HIGH (ref 0–99)
NonHDL: 151.05
Total CHOL/HDL Ratio: 3
Triglycerides: 62 mg/dL (ref 0.0–149.0)
VLDL: 12.4 mg/dL (ref 0.0–40.0)

## 2021-12-20 LAB — TSH: TSH: 3.6 u[IU]/mL (ref 0.35–5.50)

## 2021-12-20 LAB — IBC + FERRITIN
Ferritin: 5.4 ng/mL — ABNORMAL LOW (ref 10.0–291.0)
Iron: 31 ug/dL — ABNORMAL LOW (ref 42–145)
Saturation Ratios: 7.5 % — ABNORMAL LOW (ref 20.0–50.0)
TIBC: 414.4 ug/dL (ref 250.0–450.0)
Transferrin: 296 mg/dL (ref 212.0–360.0)

## 2021-12-20 LAB — VITAMIN B12: Vitamin B-12: 171 pg/mL — ABNORMAL LOW (ref 211–911)

## 2021-12-20 LAB — HEMOGLOBIN A1C: Hgb A1c MFr Bld: 5.8 % (ref 4.6–6.5)

## 2021-12-21 MED ORDER — IRON (FERROUS SULFATE) 325 (65 FE) MG PO TABS
325.0000 mg | ORAL_TABLET | Freq: Every day | ORAL | 2 refills | Status: DC
  Filled 2021-12-29: qty 30, 30d supply, fill #0

## 2021-12-21 MED ORDER — "SYRINGE 25G X 1"" 3 ML MISC": 0 refills | Status: DC

## 2021-12-21 MED ORDER — CYANOCOBALAMIN 1000 MCG/ML IJ SOLN
1000.0000 ug | INTRAMUSCULAR | 0 refills | Status: DC
  Filled 2021-12-29 (×3): qty 4, 28d supply, fill #0

## 2021-12-21 NOTE — Addendum Note (Signed)
Addended by: Crecencio Mc on: 12/21/2021 01:50 PM   Modules accepted: Orders

## 2021-12-21 NOTE — Assessment & Plan Note (Signed)
Recurrently low.  Resume weekly injections  

## 2021-12-25 ENCOUNTER — Ambulatory Visit
Admission: RE | Admit: 2021-12-25 | Discharge: 2021-12-25 | Disposition: A | Discharge status: Home / Self Care | Payer: 59 | Source: Ambulatory Visit | Attending: Obstetrics and Gynecology | Admitting: Obstetrics and Gynecology

## 2021-12-25 ENCOUNTER — Other Ambulatory Visit: Payer: Self-pay | Admitting: Obstetrics and Gynecology

## 2021-12-25 DIAGNOSIS — R921 Mammographic calcification found on diagnostic imaging of breast: Secondary | ICD-10-CM | POA: Diagnosis not present

## 2021-12-25 DIAGNOSIS — R928 Other abnormal and inconclusive findings on diagnostic imaging of breast: Secondary | ICD-10-CM

## 2021-12-26 ENCOUNTER — Other Ambulatory Visit: Payer: Self-pay

## 2021-12-27 ENCOUNTER — Other Ambulatory Visit: Payer: Self-pay

## 2021-12-29 ENCOUNTER — Ambulatory Visit: Payer: 59 | Admitting: Internal Medicine

## 2021-12-29 ENCOUNTER — Other Ambulatory Visit: Payer: Self-pay

## 2021-12-29 MED ORDER — CLOBETASOL PROPIONATE 0.05 % EX CREA
TOPICAL_CREAM | CUTANEOUS | 1 refills | Status: DC
  Filled 2021-12-29: qty 45, 30d supply, fill #0

## 2022-01-04 ENCOUNTER — Other Ambulatory Visit: Payer: 59

## 2022-01-30 ENCOUNTER — Ambulatory Visit (INDEPENDENT_AMBULATORY_CARE_PROVIDER_SITE_OTHER): Payer: 59

## 2022-01-30 DIAGNOSIS — R600 Localized edema: Secondary | ICD-10-CM | POA: Diagnosis not present

## 2022-01-30 LAB — ECHOCARDIOGRAM COMPLETE
AR max vel: 3.25 cm2
AV Area VTI: 2.95 cm2
AV Area mean vel: 3.05 cm2
AV Mean grad: 4 mmHg
AV Peak grad: 7.5 mmHg
Ao pk vel: 1.37 m/s
Area-P 1/2: 3.12 cm2
Calc EF: 78.2 %
S' Lateral: 2.8 cm
Single Plane A2C EF: 78.1 %
Single Plane A4C EF: 77 %

## 2022-02-02 DIAGNOSIS — A630 Anogenital (venereal) warts: Secondary | ICD-10-CM | POA: Diagnosis not present

## 2022-02-02 DIAGNOSIS — R238 Other skin changes: Secondary | ICD-10-CM | POA: Diagnosis not present

## 2022-02-02 DIAGNOSIS — L538 Other specified erythematous conditions: Secondary | ICD-10-CM | POA: Diagnosis not present

## 2022-02-05 DIAGNOSIS — D23122 Other benign neoplasm of skin of left lower eyelid, including canthus: Secondary | ICD-10-CM | POA: Diagnosis not present

## 2022-02-07 DIAGNOSIS — S0502XA Injury of conjunctiva and corneal abrasion without foreign body, left eye, initial encounter: Secondary | ICD-10-CM | POA: Diagnosis not present

## 2022-02-12 DIAGNOSIS — N760 Acute vaginitis: Secondary | ICD-10-CM | POA: Diagnosis not present

## 2022-02-13 DIAGNOSIS — R070 Pain in throat: Secondary | ICD-10-CM | POA: Diagnosis not present

## 2022-02-13 DIAGNOSIS — R0989 Other specified symptoms and signs involving the circulatory and respiratory systems: Secondary | ICD-10-CM | POA: Diagnosis not present

## 2022-03-01 DIAGNOSIS — Z124 Encounter for screening for malignant neoplasm of cervix: Secondary | ICD-10-CM | POA: Diagnosis not present

## 2022-03-01 DIAGNOSIS — Z1151 Encounter for screening for human papillomavirus (HPV): Secondary | ICD-10-CM | POA: Diagnosis not present

## 2022-03-01 DIAGNOSIS — N920 Excessive and frequent menstruation with regular cycle: Secondary | ICD-10-CM | POA: Diagnosis not present

## 2022-03-01 DIAGNOSIS — Z6830 Body mass index (BMI) 30.0-30.9, adult: Secondary | ICD-10-CM | POA: Diagnosis not present

## 2022-03-01 DIAGNOSIS — Z01419 Encounter for gynecological examination (general) (routine) without abnormal findings: Secondary | ICD-10-CM | POA: Diagnosis not present

## 2022-03-07 LAB — HM PAP SMEAR: HM Pap smear: NEGATIVE

## 2022-03-27 ENCOUNTER — Other Ambulatory Visit: Payer: Self-pay

## 2022-03-27 MED ORDER — AMOXICILLIN 500 MG PO CAPS
500.0000 mg | ORAL_CAPSULE | Freq: Three times a day (TID) | ORAL | 0 refills | Status: DC
  Filled 2022-03-27: qty 21, 7d supply, fill #0

## 2022-04-13 ENCOUNTER — Other Ambulatory Visit: Payer: Self-pay

## 2022-04-13 DIAGNOSIS — D2261 Melanocytic nevi of right upper limb, including shoulder: Secondary | ICD-10-CM | POA: Diagnosis not present

## 2022-04-13 DIAGNOSIS — L821 Other seborrheic keratosis: Secondary | ICD-10-CM | POA: Diagnosis not present

## 2022-04-13 DIAGNOSIS — D2262 Melanocytic nevi of left upper limb, including shoulder: Secondary | ICD-10-CM | POA: Diagnosis not present

## 2022-04-13 DIAGNOSIS — L400 Psoriasis vulgaris: Secondary | ICD-10-CM | POA: Diagnosis not present

## 2022-04-13 DIAGNOSIS — D225 Melanocytic nevi of trunk: Secondary | ICD-10-CM | POA: Diagnosis not present

## 2022-04-13 DIAGNOSIS — D2272 Melanocytic nevi of left lower limb, including hip: Secondary | ICD-10-CM | POA: Diagnosis not present

## 2022-04-13 MED ORDER — CALCIPOTRIENE 0.005 % EX CREA
TOPICAL_CREAM | CUTANEOUS | 1 refills | Status: DC
  Filled 2022-04-13: qty 60, 30d supply, fill #0

## 2022-04-13 MED ORDER — CLOBETASOL PROPIONATE 0.05 % EX SHAM
MEDICATED_SHAMPOO | CUTANEOUS | 1 refills | Status: DC
Start: 2022-04-13 — End: 2023-05-10
  Filled 2022-04-13: qty 118, 30d supply, fill #0

## 2022-04-16 ENCOUNTER — Other Ambulatory Visit: Payer: Self-pay

## 2022-04-16 ENCOUNTER — Encounter (INDEPENDENT_AMBULATORY_CARE_PROVIDER_SITE_OTHER): Payer: Self-pay

## 2022-04-25 ENCOUNTER — Ambulatory Visit (INDEPENDENT_AMBULATORY_CARE_PROVIDER_SITE_OTHER): Payer: 59 | Admitting: Dermatology

## 2022-04-25 ENCOUNTER — Other Ambulatory Visit: Payer: Self-pay

## 2022-04-25 ENCOUNTER — Ambulatory Visit: Payer: 59 | Admitting: Dermatology

## 2022-04-25 DIAGNOSIS — Z79899 Other long term (current) drug therapy: Secondary | ICD-10-CM

## 2022-04-25 DIAGNOSIS — L409 Psoriasis, unspecified: Secondary | ICD-10-CM

## 2022-04-25 DIAGNOSIS — L400 Psoriasis vulgaris: Secondary | ICD-10-CM | POA: Diagnosis not present

## 2022-04-25 MED ORDER — ZORYVE 0.3 % EX CREA
1.0000 | TOPICAL_CREAM | Freq: Every evening | CUTANEOUS | 6 refills | Status: DC
  Filled 2022-04-25: qty 60, 30d supply, fill #0
  Filled 2022-05-16: qty 60, 20d supply, fill #0

## 2022-04-25 NOTE — Progress Notes (Signed)
   New Patient Visit  Subjective  Alicia Blair is a 46 y.o. female who presents for the following: Psoriasis (Scalp, yrs, Pt has used clobetasol shampoo, clobetasol solution, Taclonex, she d/c due to concerns from ophthalmologist that saw worsening of traumatic cataract that may be contributed to topical steroid use,  pt interested in [place], pt has had Xtrac in past).  New patient referral from Dr. Kellie Moor.  The following portions of the chart were reviewed this encounter and updated as appropriate:   Tobacco  Allergies  Meds  Problems  Med Hx  Surg Hx  Fam Hx     Review of Systems:  No other skin or systemic complaints except as noted in HPI or Assessment and Plan.  Objective  Well appearing patient in no apparent distress; mood and affect are within normal limits.  A focused examination was performed including scalp. Relevant physical exam findings are noted in the Assessment and Plan.  Scalp Plaques at mastoid areas, posterior scalp   Assessment & Plan  Psoriasis Scalp Chronic and persistent condition with duration or expected duration over one year. Condition is symptomatic / bothersome to patient. Not to goal.  Complicated by cataracts thought to be worsened by topical steroid use for her psoriasis according to ophthalmologist  Psoriasis is a chronic non-curable, but treatable genetic/hereditary disease that may have other systemic features affecting other organ systems such as joints (Psoriatic Arthritis). It is associated with an increased risk of inflammatory bowel disease, heart disease, non-alcoholic fatty liver disease, and depression.     Recommend restarting Xtrac 2d/wk (pt had Xtrac yrs ago) Start Zoryve cr qhs, sample x 1 given Lot [id number] 05/2023  Discussed [place], information given  Roflumilast (ZORYVE) 0.3 % CREA - Scalp Apply to affected areas on scalp at bedtime for psoriasis.   Return in about 3 months (around 07/26/2022) for Psoriasis f/u.  I,  Othelia Pulling, RMA, am acting as scribe for Sarina Ser, MD . Documentation: I have reviewed the above documentation for accuracy and completeness, and I agree with the above.  Sarina Ser, MD

## 2022-04-25 NOTE — Patient Instructions (Addendum)
Your prescription was sent to [facility] in [place]. A representative from [facility] will contact you within 3 business hours to verify your address and insurance information to schedule a free delivery. If for any reason you do not receive a phone call from them, please reach out to them. Their phone number is [phone] and their hours are Monday-Friday 9:00 am-5:00 pm.      Due to recent changes in healthcare laws, you may see results of your pathology and/or laboratory studies on MyChart before the doctors have had a chance to review them. We understand that in some cases there may be results that are confusing or concerning to you. Please understand that not all results are received at the same time and often the doctors may need to interpret multiple results in order to provide you with the best plan of care or course of treatment. Therefore, we ask that you please give us 2 business days to thoroughly review all your results before contacting the office for clarification. Should we see a critical lab result, you will be contacted sooner.   If You Need Anything After Your Visit  If you have any questions or concerns for your doctor, please call our main line at [phone] and press option 4 to reach your doctor's medical assistant. If no one answers, please leave a voicemail as directed and we will return your call as soon as possible. Messages left after 4 pm will be answered the following business day.   You may also send us a message via MyChart. We typically respond to MyChart messages within 1-2 business days.  For prescription refills, please ask your pharmacy to contact our office. Our fax number is [phone].  If you have an urgent issue when the clinic is closed that cannot wait until the next business day, you can page your doctor at the number below.    Please note that while we do our best to be available for urgent issues outside of office hours, we are not  available 24/7.   If you have an urgent issue and are unable to reach us, you may choose to seek medical care at your doctor's office, retail clinic, urgent care center, or emergency room.  If you have a medical emergency, please immediately call 911 or go to the emergency department.  Pager Numbers  - Dr. Kowalski: [phone]  - Dr. Moye: [phone]  - Dr. Stewart: [phone]  In the event of inclement weather, please call our main line at [phone] for an update on the status of any delays or closures.  Dermatology Medication Tips: Please keep the boxes that topical medications come in in order to help keep track of the instructions about where and how to use these. Pharmacies typically print the medication instructions only on the boxes and not directly on the medication tubes.   If your medication is too expensive, please contact our office at [phone] option 4 or send us a message through MyChart.   We are unable to tell what your co-pay for medications will be in advance as this is different depending on your insurance coverage. However, we may be able to find a substitute medication at lower cost or fill out paperwork to get insurance to cover a needed medication.   If a prior authorization is required to get your medication covered by your insurance company, please allow us 1-2 business days to complete this process.  Drug prices often vary depending on where the prescription is   filled and some pharmacies may offer cheaper prices.  The website www.goodrx.com contains coupons for medications through different pharmacies. The prices here do not account for what the cost may be with help from insurance (it may be cheaper with your insurance), but the website can give you the price if you did not use any insurance.  - You can print the associated coupon and take it with your prescription to the pharmacy.  - You may also stop by our office during regular business hours  and pick up a GoodRx coupon card.  - If you need your prescription sent electronically to a different pharmacy, notify our office through [facility] MyChart or by phone at [phone] option 4.     Si Usted Necesita Algo Despus de Su Visita  Tambin puede enviarnos un mensaje a travs de MyChart. Por lo general respondemos a los mensajes de MyChart en el transcurso de 1 a 2 das hbiles.  Para renovar recetas, por favor pida a su farmacia que se ponga en contacto con nuestra oficina. Nuestro nmero de fax es el [phone].  Si tiene un asunto urgente cuando la clnica est cerrada y que no puede esperar hasta el siguiente da hbil, puede llamar/localizar a su doctor(a) al nmero que aparece a continuacin.   Por favor, tenga en cuenta que aunque hacemos todo lo posible para estar disponibles para asuntos urgentes fuera del horario de oficina, no estamos disponibles las 24 horas del da, los 7 das de la semana.   Si tiene un problema urgente y no puede comunicarse con nosotros, puede optar por buscar atencin mdica  en el consultorio de su doctor(a), en una clnica privada, en un centro de atencin urgente o en una sala de emergencias.  Si tiene una emergencia mdica, por favor llame inmediatamente al 911 o vaya a la sala de emergencias.  Nmeros de bper  - Dr. Kowalski: [phone]  - Dra. Moye: [phone]  - Dra. Stewart: [phone]  En caso de inclemencias del tiempo, por favor llame a nuestra lnea principal al [phone] para una actualizacin sobre el estado de cualquier retraso o cierre.  Consejos para la medicacin en dermatologa: Por favor, guarde las cajas en las que vienen los medicamentos de uso tpico para ayudarle a seguir las instrucciones sobre dnde y cmo usarlos. Las farmacias generalmente imprimen las instrucciones del medicamento slo en las cajas y no directamente en los tubos del medicamento.   Si su medicamento es muy caro, por favor, pngase  en contacto con nuestra oficina llamando al [phone] y presione la opcin 4 o envenos un mensaje a travs de MyChart.   No podemos decirle cul ser su copago por los medicamentos por adelantado ya que esto es diferente dependiendo de la cobertura de su seguro. Sin embargo, es posible que podamos encontrar un medicamento sustituto a menor costo o llenar un formulario para que el seguro cubra el medicamento que se considera necesario.   Si se requiere una autorizacin previa para que su compaa de seguros cubra su medicamento, por favor permtanos de 1 a 2 das hbiles para completar este proceso.  Los precios de los medicamentos varan con frecuencia dependiendo del lugar de dnde se surte la receta y alguna farmacias pueden ofrecer precios ms baratos.  El sitio web www.goodrx.com tiene cupones para medicamentos de diferentes farmacias. Los precios aqu no tienen en cuenta lo que podra costar con la ayuda del seguro (puede ser ms barato con su seguro), pero el sitio web puede darle   el precio si no utiliz ningn seguro.  - Puede imprimir el cupn correspondiente y llevarlo con su receta a la farmacia.  - Tambin puede pasar por nuestra oficina durante el horario de atencin regular y recoger una tarjeta de cupones de GoodRx.  - Si necesita que su receta se enve electrnicamente a una farmacia diferente, informe a nuestra oficina a travs de MyChart de [place] o por telfono llamando al [phone] y presione la opcin 4.  

## 2022-04-25 NOTE — Progress Notes (Signed)
Patient here today for xtrac treatment of psoriasis vulgaris.    Total Surface Area: 84cm2 Total Energy: 33.60J Service Code: 17001  Johnsie Kindred, RMA Documentation: I have reviewed the above documentation for accuracy and completeness, and I agree with the above.  Sarina Ser, MD

## 2022-04-26 ENCOUNTER — Other Ambulatory Visit: Payer: Self-pay | Admitting: Obstetrics and Gynecology

## 2022-04-26 ENCOUNTER — Other Ambulatory Visit: Payer: Self-pay

## 2022-04-26 DIAGNOSIS — R921 Mammographic calcification found on diagnostic imaging of breast: Secondary | ICD-10-CM

## 2022-04-27 ENCOUNTER — Other Ambulatory Visit: Payer: Self-pay

## 2022-04-30 ENCOUNTER — Ambulatory Visit (INDEPENDENT_AMBULATORY_CARE_PROVIDER_SITE_OTHER): Payer: 59 | Admitting: Dermatology

## 2022-04-30 ENCOUNTER — Other Ambulatory Visit: Payer: Self-pay

## 2022-04-30 DIAGNOSIS — B3732 Chronic candidiasis of vulva and vagina: Secondary | ICD-10-CM | POA: Diagnosis not present

## 2022-04-30 DIAGNOSIS — N390 Urinary tract infection, site not specified: Secondary | ICD-10-CM | POA: Diagnosis not present

## 2022-04-30 DIAGNOSIS — L400 Psoriasis vulgaris: Secondary | ICD-10-CM | POA: Diagnosis not present

## 2022-04-30 DIAGNOSIS — N760 Acute vaginitis: Secondary | ICD-10-CM | POA: Diagnosis not present

## 2022-04-30 DIAGNOSIS — R350 Frequency of micturition: Secondary | ICD-10-CM | POA: Diagnosis not present

## 2022-04-30 NOTE — Progress Notes (Signed)
Patient here today for xtrac treatment of psoriasis vulgaris.    Total Surface Area: 112cm2 Total Energy: 51.52J Service Code: 74081  Dicie Beam, RMA Documentation: I have reviewed the above documentation for accuracy and completeness, and I agree with the above.  Sarina Ser, MD

## 2022-05-01 ENCOUNTER — Ambulatory Visit (INDEPENDENT_AMBULATORY_CARE_PROVIDER_SITE_OTHER): Payer: 59 | Admitting: Vascular Surgery

## 2022-05-02 ENCOUNTER — Ambulatory Visit (INDEPENDENT_AMBULATORY_CARE_PROVIDER_SITE_OTHER): Payer: 59 | Admitting: Dermatology

## 2022-05-02 DIAGNOSIS — L400 Psoriasis vulgaris: Secondary | ICD-10-CM | POA: Diagnosis not present

## 2022-05-02 NOTE — Progress Notes (Signed)
Patient here today for xtrac treatment of psoriasis vulgaris.    Total Surface Area: 172cm2 Total Energy: 94.94J Service Code: 94997    Alicia Blair, RMA Documentation: I have reviewed the above documentation for accuracy and completeness, and I agree with the above.  Sarina Ser, MD

## 2022-05-04 ENCOUNTER — Encounter: Payer: Self-pay | Admitting: Dermatology

## 2022-05-05 ENCOUNTER — Encounter: Payer: Self-pay | Admitting: Dermatology

## 2022-05-07 ENCOUNTER — Ambulatory Visit (INDEPENDENT_AMBULATORY_CARE_PROVIDER_SITE_OTHER): Payer: 59 | Admitting: Dermatology

## 2022-05-07 DIAGNOSIS — L400 Psoriasis vulgaris: Secondary | ICD-10-CM | POA: Diagnosis not present

## 2022-05-07 NOTE — Progress Notes (Signed)
Patient here today for xtrac treatment of psoriasis vulgaris.    Total Surface Area: 164cm2 Total Energy: 108.57J Service Code: 40352         Johnsie Kindred, RMA Documentation: I have reviewed the above documentation for accuracy and completeness, and I agree with the above.  Sarina Ser, MD

## 2022-05-08 ENCOUNTER — Ambulatory Visit (INDEPENDENT_AMBULATORY_CARE_PROVIDER_SITE_OTHER): Payer: 59 | Admitting: Internal Medicine

## 2022-05-08 ENCOUNTER — Encounter: Payer: Self-pay | Admitting: Internal Medicine

## 2022-05-08 VITALS — BP 104/68 | HR 74 | Temp 98.2°F | Ht 67.0 in | Wt 192.4 lb

## 2022-05-08 DIAGNOSIS — L409 Psoriasis, unspecified: Secondary | ICD-10-CM

## 2022-05-08 DIAGNOSIS — Z124 Encounter for screening for malignant neoplasm of cervix: Secondary | ICD-10-CM | POA: Diagnosis not present

## 2022-05-08 DIAGNOSIS — D500 Iron deficiency anemia secondary to blood loss (chronic): Secondary | ICD-10-CM

## 2022-05-08 DIAGNOSIS — R5383 Other fatigue: Secondary | ICD-10-CM | POA: Diagnosis not present

## 2022-05-08 DIAGNOSIS — N921 Excessive and frequent menstruation with irregular cycle: Secondary | ICD-10-CM

## 2022-05-08 DIAGNOSIS — R7301 Impaired fasting glucose: Secondary | ICD-10-CM | POA: Diagnosis not present

## 2022-05-08 DIAGNOSIS — N3289 Other specified disorders of bladder: Secondary | ICD-10-CM

## 2022-05-08 DIAGNOSIS — E538 Deficiency of other specified B group vitamins: Secondary | ICD-10-CM | POA: Diagnosis not present

## 2022-05-08 DIAGNOSIS — E669 Obesity, unspecified: Secondary | ICD-10-CM

## 2022-05-08 DIAGNOSIS — Z0000 Encounter for general adult medical examination without abnormal findings: Secondary | ICD-10-CM

## 2022-05-08 DIAGNOSIS — D508 Other iron deficiency anemias: Secondary | ICD-10-CM

## 2022-05-08 DIAGNOSIS — I890 Lymphedema, not elsewhere classified: Secondary | ICD-10-CM

## 2022-05-08 DIAGNOSIS — E66811 Obesity, class 1: Secondary | ICD-10-CM

## 2022-05-08 NOTE — Progress Notes (Unsigned)
The patient is here for annual preventive examination and management of other chronic and acute problems.   The risk factors are reflected in the social history.   The roster of all physicians providing medical care to patient - is listed in the Snapshot section of the chart.   Activities of daily living:  The patient is 100% independent in all ADLs: dressing, toileting, feeding as well as independent mobility   Home safety : The patient has smoke detectors in the home. They wear seatbelts.  There are no unsecured firearms at home. There is no violence in the home.    There is no risks for hepatitis, STDs or HIV. There is no   history of blood transfusion. They have no travel history to infectious disease endemic areas of the world.   The patient has seen their dentist in the last six month. They have seen their eye doctor in the last year. The patinet  denies slight hearing difficulty with regard to whispered voices and some television programs.  They have deferred audiologic testing in the last year.  They do not  have excessive sun exposure. Discussed the need for sun protection: hats, long sleeves and use of sunscreen if there is significant sun exposure.    Diet: the importance of a healthy diet is discussed. They do have a healthy diet.   The benefits of regular aerobic exercise were discussed. The patient  exercises  3 to 5 days per week  for  60 minutes.    Depression screen: there are no signs or vegative symptoms of depression- irritability, change in appetite, anhedonia, sadness/tearfullness.   The following portions of the patient's history were reviewed and updated as appropriate: allergies, current medications, past family history, past medical history,  past surgical history, past social history  and problem list.   Visual acuity was not assessed per patient preference since the patient has regular follow up with an  ophthalmologist. Hearing and body mass index were assessed and  reviewed.    During the course of the visit the patient was educated and counseled about appropriate screening and preventive services including : fall prevention , diabetes screening, nutrition counseling, colorectal cancer screening, and recommended immunizations.    Chief Complaint:   1) OBESITY:  SHE HAS LOST 20 LBS SINCE jULY 4 Using  "e59m" PROGRAM BY AN EX MARINE IN [place].   2) bladder spasms /perineal spasms.  Seeing brandon in the near future  3) mild IDA :  menorrhagia improving with micronor,  but having recurrent candida glabrata   4) chhronic constipation for the past year .  Using colace daily    Review of Symptoms  Patient denies headache, fevers, malaise, unintentional weight loss, skin rash, eye pain, sinus congestion and sinus pain, sore throat, dysphagia,  hemoptysis , cough, dyspnea, wheezing, chest pain, palpitations, orthopnea, edema, abdominal pain, nausea, melena, diarrhea, constipation, flank pain, dysuria, hematuria, urinary  Frequency, nocturia, numbness, tingling, seizures,  Focal weakness, Loss of consciousness,  Tremor, insomnia, depression, anxiety, and suicidal ideation.    Physical Exam:  BP 104/68 (BP Location: Left Arm, Patient Position: Sitting, Cuff Size: Large)   Pulse 74   Temp 98.2 F (36.8 C) (Oral)   Ht '5\' 7"'$  (1.702 m)   Wt 192 lb 6.4 oz (87.3 kg)   SpO2 98%   BMI 30.13 kg/m      Assessment and Plan:  No problem-specific Assessment & Plan notes found for this encounter.   Updated Medication  List Outpatient Encounter Medications as of 05/08/2022  Medication Sig   calcipotriene (DOVONOX) 0.005 % cream Apply to indicated areas twice a day.   clobetasol cream (TEMOVATE) 3.54 % Apply 1 Application topically 2 (two) times daily.   Clobetasol Propionate 0.05 % shampoo Lather scalp x 5 minutes, rinse well. Use 2 x weekly.   cyanocobalamin (,VITAMIN B-12,) 1000 MCG/ML injection Inject 1 mL (1,000 mcg total) into the muscle once a week.    docusate sodium (COLACE) 100 MG capsule Take 100 mg by mouth daily.   famotidine (PEPCID) 20 MG tablet Take 20 mg by mouth as needed for heartburn or indigestion.   Iron, Ferrous Sulfate, 325 (65 Fe) MG TABS Take 325 mg by mouth daily.   norethindrone (MICRONOR) 0.35 MG tablet Take 1 tablet by mouth daily.   Roflumilast (ZORYVE) 0.3 % CREA Apply to affected areas on scalp at bedtime for psoriasis.   Syringe/Needle, Disp, (SYRINGE 3CC/25GX1") 25G X 1" 3 ML MISC Use for b12 injections   [DISCONTINUED] amoxicillin (AMOXIL) 500 MG capsule Take 1 capsule (500 mg total) by mouth every 8 (eight) hours until all taken (Patient not taking: Reported on 04/25/2022)   [DISCONTINUED] baclofen (LIORESAL) 10 MG tablet Take 1 tablet (10 mg total) by mouth 2 (two) times daily. May cause drowsiness. (Patient not taking: Reported on 10/27/2021)   [DISCONTINUED] clobetasol cream (TEMOVATE) 0.05 % Apply topically. (Patient not taking: Reported on 05/08/2022)   [DISCONTINUED] Multiple Vitamin (MULTIVITAMIN) tablet Take 2 tablets by mouth daily. (Patient not taking: Reported on 04/25/2022)   [DISCONTINUED] omeprazole (PRILOSEC) 40 MG capsule Take 1 capsule (40 mg total) by mouth daily. (Patient not taking: Reported on 04/25/2022)   No facility-administered encounter medications on file as of 05/08/2022.

## 2022-05-08 NOTE — Patient Instructions (Addendum)
Add citrucel or Benefiber daily to colace regimen to improve consistency of stools   Keep glycerin suppositories around and use if stools become large in calibe (to prevent fissures)   Drop off a urine specimen 5 days before your urology visit. (If you are NOT menstruating)

## 2022-05-09 ENCOUNTER — Ambulatory Visit (INDEPENDENT_AMBULATORY_CARE_PROVIDER_SITE_OTHER): Payer: 59 | Admitting: Dermatology

## 2022-05-09 DIAGNOSIS — L400 Psoriasis vulgaris: Secondary | ICD-10-CM | POA: Diagnosis not present

## 2022-05-09 LAB — COMPREHENSIVE METABOLIC PANEL
ALT: 8 U/L (ref 0–35)
AST: 14 U/L (ref 0–37)
Albumin: 4.5 g/dL (ref 3.5–5.2)
Alkaline Phosphatase: 45 U/L (ref 39–117)
BUN: 13 mg/dL (ref 6–23)
CO2: 24 mEq/L (ref 19–32)
Calcium: 9 mg/dL (ref 8.4–10.5)
Chloride: 102 mEq/L (ref 96–112)
Creatinine, Ser: 0.62 mg/dL (ref 0.40–1.20)
GFR: 106.84 mL/min (ref >60.00)
Glucose, Bld: 74 mg/dL (ref 70–99)
Potassium: 3.7 mEq/L (ref 3.5–5.1)
Sodium: 136 mEq/L (ref 135–145)
Total Bilirubin: 0.5 mg/dL (ref 0.2–1.2)
Total Protein: 6.9 g/dL (ref 6.0–8.3)

## 2022-05-09 LAB — LIPID PANEL W/REFLEX DIRECT LDL
Cholesterol: 194 mg/dL (ref <200)
HDL: 60 mg/dL (ref >=50)
LDL Cholesterol (Calc): 119 mg/dL (calc) — ABNORMAL HIGH
Non-HDL Cholesterol (Calc): 134 mg/dL (calc) — ABNORMAL HIGH (ref <130)
Total CHOL/HDL Ratio: 3.2 (calc) (ref <5.0)
Triglycerides: 48 mg/dL (ref <150)

## 2022-05-09 LAB — TSH: TSH: 2.65 u[IU]/mL (ref 0.35–5.50)

## 2022-05-09 LAB — CBC WITH DIFFERENTIAL/PLATELET
Basophils Absolute: 0.1 10*3/uL (ref 0.0–0.1)
Basophils Relative: 1.3 % (ref 0.0–3.0)
Eosinophils Absolute: 0.1 10*3/uL (ref 0.0–0.7)
Eosinophils Relative: 1.5 % (ref 0.0–5.0)
HCT: 34.5 % — ABNORMAL LOW (ref 36.0–46.0)
Hemoglobin: 11.5 g/dL — ABNORMAL LOW (ref 12.0–15.0)
Lymphocytes Relative: 30.2 % (ref 12.0–46.0)
Lymphs Abs: 2 10*3/uL (ref 0.7–4.0)
MCHC: 33.2 g/dL (ref 30.0–36.0)
MCV: 85.9 fl (ref 78.0–100.0)
Monocytes Absolute: 0.5 10*3/uL (ref 0.1–1.0)
Monocytes Relative: 8.2 % (ref 3.0–12.0)
Neutro Abs: 3.8 10*3/uL (ref 1.4–7.7)
Neutrophils Relative %: 58.8 % (ref 43.0–77.0)
Platelets: 291 10*3/uL (ref 150.0–400.0)
RBC: 4.01 Mil/uL (ref 3.87–5.11)
RDW: 13.8 % (ref 11.5–15.5)
WBC: 6.5 10*3/uL (ref 4.0–10.5)

## 2022-05-09 LAB — IBC + FERRITIN
Ferritin: 9.1 ng/mL — ABNORMAL LOW (ref 10.0–291.0)
Iron: 58 ug/dL (ref 42–145)
Saturation Ratios: 15.4 % — ABNORMAL LOW (ref 20.0–50.0)
TIBC: 376.6 ug/dL (ref 250.0–450.0)
Transferrin: 269 mg/dL (ref 212.0–360.0)

## 2022-05-09 LAB — HEMOGLOBIN A1C: Hgb A1c MFr Bld: 5.8 % (ref 4.6–6.5)

## 2022-05-09 LAB — VITAMIN B12: Vitamin B-12: 164 pg/mL — ABNORMAL LOW (ref 211–911)

## 2022-05-09 NOTE — Assessment & Plan Note (Signed)
Affecting scalp.  Under treatment by dermatology

## 2022-05-09 NOTE — Assessment & Plan Note (Addendum)
Done annually by GYN Dr Julien Girt at [facility] due to history of abnormal PAP.  Last one normal sept 2023

## 2022-05-09 NOTE — Assessment & Plan Note (Signed)
She has lost 20 lbs thus far.  I have congratulated her in reduction of   BMI and encouraged  Continued adherence to E to [facility] program/

## 2022-05-09 NOTE — Progress Notes (Signed)
Patient here today for xtrac treatment of psoriasis vulgaris.    Total Surface Area: 148cm2 Total Energy: 117.51J Service Code: 12224          Alicia Blair, RMA Documentation: I have reviewed the above documentation for accuracy and completeness, and I agree with the above.  Sarina Ser, MD

## 2022-05-09 NOTE — Assessment & Plan Note (Signed)
age appropriate education and counseling updated, referrals for preventative services and immunizations addressed, dietary and smoking counseling addressed, most recent labs reviewed.  I have personally reviewed and have noted:   1) the patient's medical and social history 2) The pt's use of alcohol, tobacco, and illicit drugs 3) The patient's current medications and supplements 4) Functional ability including ADL's, fall risk, home safety risk, hearing and visual impairment 5) Diet and physical activities 6) Evidence for depression or mood disorder 7) The patient's height, weight, and BMI have been recorded in the chart    I have reviewed previous reports from GYN ,  vascular surgery and cardiology,  provided counseling and education based on review of the above

## 2022-05-09 NOTE — Assessment & Plan Note (Signed)
Improved but not resolved with micronor , managed by GYN.

## 2022-05-09 NOTE — Assessment & Plan Note (Signed)
Improving with regular participation  in aerobic exercise.

## 2022-05-09 NOTE — Assessment & Plan Note (Signed)
secondary to menorrhagia.   Considering uterine ablation.  Taking iron every other day  Lab Results  Component Value Date   IRON 58 05/08/2022   TIBC 376.6 05/08/2022   FERRITIN 9.1 (L) 05/08/2022

## 2022-05-11 ENCOUNTER — Encounter: Payer: Self-pay | Admitting: Internal Medicine

## 2022-05-11 ENCOUNTER — Other Ambulatory Visit: Payer: Self-pay

## 2022-05-11 ENCOUNTER — Other Ambulatory Visit: Payer: Self-pay | Admitting: Internal Medicine

## 2022-05-11 DIAGNOSIS — E538 Deficiency of other specified B group vitamins: Secondary | ICD-10-CM

## 2022-05-11 DIAGNOSIS — D508 Other iron deficiency anemias: Secondary | ICD-10-CM

## 2022-05-11 MED ORDER — CYANOCOBALAMIN 1000 MCG/ML IJ SOLN
1000.0000 ug | INTRAMUSCULAR | 3 refills | Status: DC
  Filled 2022-05-11: qty 4, 28d supply, fill #0
  Filled 2022-06-26: qty 1, 30d supply, fill #1
  Filled 2022-08-27 (×2): qty 4, 28d supply, fill #1
  Filled 2022-12-03: qty 4, 28d supply, fill #2
  Filled 2023-02-11: qty 1, 30d supply, fill #3

## 2022-05-11 NOTE — Assessment & Plan Note (Signed)
Recurrently low.  Resume weekly injections

## 2022-05-14 ENCOUNTER — Ambulatory Visit (INDEPENDENT_AMBULATORY_CARE_PROVIDER_SITE_OTHER): Payer: 59 | Admitting: Dermatology

## 2022-05-14 DIAGNOSIS — L400 Psoriasis vulgaris: Secondary | ICD-10-CM

## 2022-05-14 NOTE — Progress Notes (Signed)
Patient here today for xtrac treatment of psoriasis vulgaris.    Total Surface Area: 88cm2 Total Energy: 83.86J Service Code: 32549          Alicia Blair, RMA Documentation: I have reviewed the above documentation for accuracy and completeness, and I agree with the above.  Sarina Ser, MD

## 2022-05-15 ENCOUNTER — Encounter: Payer: Self-pay | Admitting: Dermatology

## 2022-05-16 ENCOUNTER — Ambulatory Visit (INDEPENDENT_AMBULATORY_CARE_PROVIDER_SITE_OTHER): Payer: 59 | Admitting: Dermatology

## 2022-05-16 ENCOUNTER — Other Ambulatory Visit: Payer: Self-pay

## 2022-05-16 ENCOUNTER — Other Ambulatory Visit (INDEPENDENT_AMBULATORY_CARE_PROVIDER_SITE_OTHER): Payer: 59

## 2022-05-16 DIAGNOSIS — N3289 Other specified disorders of bladder: Secondary | ICD-10-CM

## 2022-05-16 DIAGNOSIS — D508 Other iron deficiency anemias: Secondary | ICD-10-CM

## 2022-05-16 DIAGNOSIS — E538 Deficiency of other specified B group vitamins: Secondary | ICD-10-CM | POA: Diagnosis not present

## 2022-05-16 DIAGNOSIS — L400 Psoriasis vulgaris: Secondary | ICD-10-CM

## 2022-05-16 LAB — URINALYSIS, ROUTINE W REFLEX MICROSCOPIC
Bilirubin Urine: NEGATIVE
Hgb urine dipstick: NEGATIVE
Ketones, ur: NEGATIVE
Leukocytes,Ua: NEGATIVE
Nitrite: NEGATIVE
Specific Gravity, Urine: 1.01 (ref 1.000–1.030)
Total Protein, Urine: NEGATIVE
Urine Glucose: NEGATIVE
Urobilinogen, UA: 0.2 (ref 0.0–1.0)
pH: 7 (ref 5.0–8.0)

## 2022-05-16 LAB — FOLATE: Folate: 14.5 ng/mL (ref >5.9)

## 2022-05-16 NOTE — Progress Notes (Signed)
Patient here today for xtrac treatment of psoriasis vulgaris.    Total Surface Area: 148cm2 Total Energy: 169.31 Service Code: 83167          Johnsie Kindred, RMA Documentation: I have reviewed the above documentation for accuracy and completeness, and I agree with the above.  Sarina Ser, MD

## 2022-05-18 LAB — URINE CULTURE
MICRO NUMBER:: 14246222
Result:: NO GROWTH
SPECIMEN QUALITY:: ADEQUATE

## 2022-05-18 LAB — INTRINSIC FACTOR ANTIBODIES: Intrinsic Factor: NEGATIVE

## 2022-05-22 ENCOUNTER — Ambulatory Visit (INDEPENDENT_AMBULATORY_CARE_PROVIDER_SITE_OTHER): Payer: 59

## 2022-05-22 DIAGNOSIS — L400 Psoriasis vulgaris: Secondary | ICD-10-CM | POA: Diagnosis not present

## 2022-05-22 NOTE — Progress Notes (Signed)
Patient here today for xtrac treatment of psoriasis vulgaris.    Total Surface Area: 184cm2 Total Energy:  263.12 Service Code: 28768          Johnsie Kindred, RMA

## 2022-05-23 ENCOUNTER — Ambulatory Visit (INDEPENDENT_AMBULATORY_CARE_PROVIDER_SITE_OTHER): Payer: 59 | Admitting: Urology

## 2022-05-23 ENCOUNTER — Encounter: Payer: Self-pay | Admitting: Dermatology

## 2022-05-23 VITALS — BP 147/81 | HR 86 | Ht 67.0 in | Wt 190.0 lb

## 2022-05-23 DIAGNOSIS — R3129 Other microscopic hematuria: Secondary | ICD-10-CM | POA: Diagnosis not present

## 2022-05-23 DIAGNOSIS — Z87440 Personal history of urinary (tract) infections: Secondary | ICD-10-CM

## 2022-05-23 DIAGNOSIS — R350 Frequency of micturition: Secondary | ICD-10-CM | POA: Diagnosis not present

## 2022-05-23 LAB — BLADDER SCAN AMB NON-IMAGING: Scan Result: 16

## 2022-05-23 NOTE — Progress Notes (Signed)
05/23/2022 12:23 PM   [facility] [birth year] [id number]  Referring provider: Marylynn Pearson, MD [address] [place],  [place] [address]  Chief Complaint  Patient presents with   Urinary Tract Infection    HPI: 46 year old female who presents today for further evaluation of UTIs.  She was referred by her OB/GYN, Dr. Julien [facility]irt at [facility].  Records sent indicate that in mid November, she had a urinalysis that had some small blood and leukocytes on dip.  Urine culture showed low colony count of mixed flora.  She was treated with antibiotics for presumed infection in the setting of urinary frequency.  No other documented infections available for review today.  No recent upper tract imaging.  She notes that around the time that she was seen by her OB/GYN, she started having severe perineal and pelvic spasms.  She thinks that these may have been bladder spasms although does not indicate any discomfort in her suprapubic area.  She did have some associated urinary frequency.  She denied any dysuria.  She does not have a personal history of recurrent UTIs.  She has been struggling with constipation over the past year but it was on his bowel regimen including fiber as well as daily Colace which is regulated her symptoms.  She is not sexually active.  She denies any vaginal symptoms.  She does report that she had this once about 15 years ago.  During that time, she was eating a pretty unhealthy diet and modifying these factors improved her symptoms.  After being treated with Cipro, her symptoms slowly waned.  Over the past 2 weeks, she is been asymptomatic.  She does have some incidental microscopic blood in her urine today, 3-10 red blood cells per high-powered field although is about to start her menstrual cycle probably tomorrow.  She has not never smoker.   PMH: Past Medical History:  Diagnosis Date   Anemia    with pregnancy    Asthma    as a child   Chicken pox    Complication of anesthesia    PONV (postoperative nausea and vomiting)     Surgical History: Past Surgical History:  Procedure Laterality Date   breast augmentation  2003   HERNIA REPAIR     LAPAROSCOPIC GASTRIC BANDING  2008   WISDOM TOOTH EXTRACTION      Home Medications:  Allergies as of 05/23/2022       Reactions   Codeine Nausea And Vomiting, Nausea Only   Erythromycin Nausea And Vomiting        Medication List        Accurate as of May 23, 2022 12:23 PM. If you have any questions, ask your nurse or doctor.          STOP taking these medications    norethindrone 0.35 MG tablet Commonly known as: MICRONOR Stopped by: Hollice Espy, MD   Zoryve 0.3 % Crea Generic drug: Roflumilast Stopped by: Hollice Espy, MD       TAKE these medications    calcipotriene 0.005 % cream Commonly known as: DOVONOX Apply to indicated areas twice a day.   clobetasol cream 0.05 % Commonly known as: TEMOVATE Apply 1 Application topically 2 (two) times daily.   Clobetasol Propionate 0.05 % shampoo Lather scalp x 5 minutes, rinse well. Use 2 x weekly.   cyanocobalamin 1000 MCG/ML injection Commonly known as: VITAMIN B12 Inject 1 mL (1,000 mcg total) into the muscle once  a week. Then monthly thereafter   docusate sodium 100 MG capsule Commonly known as: COLACE Take 100 mg by mouth daily.   famotidine 20 MG tablet Commonly known as: PEPCID Take 20 mg by mouth as needed for heartburn or indigestion.   Iron (Ferrous Sulfate) 325 (65 Fe) MG Tabs Take 325 mg by mouth daily.   SYRINGE 3CC/25GX1" 25G X 1" 3 ML Misc Use for b12 injections        Allergies:  Allergies  Allergen Reactions   Codeine Nausea And Vomiting and Nausea Only   Erythromycin Nausea And Vomiting    Family History: Family History  Problem Relation Age of Onset   Diabetes Mother    Cancer Mother 15       endometrial CA   Atrial fibrillation  Mother    Hypertension Mother    Hyperlipidemia Mother    Diabetes Maternal Aunt    Diabetes Maternal Grandmother    Cancer Brother 21       testicular ca   Diabetes Brother     Social History:  reports that she has never smoked. She has never used smokeless tobacco. She reports current alcohol use of about 3.0 standard drinks of alcohol per week. She reports that she does not use drugs.   Physical Exam: BP (!) 147/81   Pulse 86   Ht '5\' 7"'$  (1.702 m)   Wt 190 lb (86.2 kg)   BMI 29.76 kg/m   Constitutional:  Alert and oriented, No acute distress. HEENT: [place] AT, moist mucus membranes.  Trachea midline, no masses. Cardiovascular: No clubbing, cyanosis, or edema. GU: No CVA tenderness Skin: No rashes, bruises or suspicious lesions. Neurologic: Grossly intact, no focal deficits, moving all 4 extremities. Psychiatric: Normal mood and affect.  Laboratory Data: Lab Results  Component Value Date   WBC 6.5 05/08/2022   HGB 11.5 (L) 05/08/2022   HCT 34.5 (L) 05/08/2022   MCV 85.9 05/08/2022   PLT 291.0 05/08/2022    Lab Results  Component Value Date   CREATININE 0.62 05/08/2022    Lab Results  Component Value Date   HGBA1C 5.8 05/08/2022    Urinalysis Results for orders placed or performed in visit on 05/23/22  Bladder Scan (Post Void Residual) in office  Result Value Ref Range   Scan Result 16     UA today with 3-10 red blood cells per high-power field otherwise negative  Assessment & Plan:    1.  History of UTI At the time when patient was symptomatic, she did have a suspicious appearing urinalysis despite negative urine culture and did improve on the Cipro  We discussed the differential diagnosis which could clued pelvic floor dysfunction, bladder spasm, or urinary tract infection amongst others.  Given that her symptoms have resolved and her urinalysis is essentially negative today, we will hold off on any further workup but she was advised to return for  reevaluation if she develops recurrent symptoms.  She is agreeable this plan. - Urinalysis, Complete - Bladder Scan (Post Void Residual) in office - Urinalysis, Complete; Future  2. Microscopic hematuria Microscopic hematuria, likely related to menstrual cycle  Plan to recheck urinalysis and 2 weeks, if cleared no further workup.  If she still has persistent microscopic blood, would recommend renal ultrasound and cystoscopy given that she is relatively low risk. - Urinalysis, Complete; Future   Return for lab visit 2 weeks with UA.  Hollice Espy, MD  [facility] [address] [place]  [place], [place] [address] [phone]

## 2022-05-24 ENCOUNTER — Ambulatory Visit (INDEPENDENT_AMBULATORY_CARE_PROVIDER_SITE_OTHER): Payer: 59

## 2022-05-24 DIAGNOSIS — L400 Psoriasis vulgaris: Secondary | ICD-10-CM

## 2022-05-24 LAB — MICROSCOPIC EXAMINATION

## 2022-05-24 LAB — URINALYSIS, COMPLETE
Bilirubin, UA: NEGATIVE
Glucose, UA: NEGATIVE
Ketones, UA: NEGATIVE
Leukocytes,UA: NEGATIVE
Nitrite, UA: NEGATIVE
Protein,UA: NEGATIVE
Specific Gravity, UA: 1.02 (ref 1.005–1.030)
Urobilinogen, Ur: 0.2 mg/dL (ref 0.2–1.0)
pH, UA: 7.5 (ref 5.0–7.5)

## 2022-05-24 NOTE — Progress Notes (Signed)
Patient here today for xtrac treatment of psoriasis vulgaris.    Total Surface Area: 100cm2 Total Energy: 178.80 Service Code: 36122  Dicie Beam RMA

## 2022-05-27 ENCOUNTER — Encounter: Payer: Self-pay | Admitting: Dermatology

## 2022-05-28 ENCOUNTER — Ambulatory Visit (INDEPENDENT_AMBULATORY_CARE_PROVIDER_SITE_OTHER): Payer: 59

## 2022-05-28 DIAGNOSIS — L400 Psoriasis vulgaris: Secondary | ICD-10-CM

## 2022-05-28 NOTE — Progress Notes (Signed)
Patient here today for xtrac treatment of psoriasis vulgaris.    Total Surface Area: 128cm2 Total Energy: 263.17 Service Code: 06986          Johnsie Kindred, RMA

## 2022-05-30 ENCOUNTER — Encounter: Payer: Self-pay | Admitting: Family Medicine

## 2022-05-30 ENCOUNTER — Ambulatory Visit: Payer: 59

## 2022-05-30 ENCOUNTER — Ambulatory Visit: Payer: 59 | Admitting: Family Medicine

## 2022-05-30 VITALS — BP 120/80 | HR 89 | Temp 98.8°F | Ht 67.0 in | Wt 198.5 lb

## 2022-05-30 DIAGNOSIS — J029 Acute pharyngitis, unspecified: Secondary | ICD-10-CM

## 2022-05-30 LAB — POCT RAPID STREP A (OFFICE): Rapid Strep A Screen: NEGATIVE

## 2022-05-30 LAB — POC COVID19 BINAXNOW: SARS Coronavirus 2 Ag: NEGATIVE

## 2022-05-30 NOTE — Progress Notes (Unsigned)
    Alicia Lacorte T. Azrielle Springsteen, MD, [place] [place], [address]  Phone: [phone]  FAX: Daniels - 46 y.o. female  MRN [id number]  Date of Birth: May 18, 1976  Date: 05/30/2022  PCP: Crecencio Mc, MD  Referral: Crecencio Mc, MD  Chief Complaint  Patient presents with   Sore Throat    X 3 weeks (feels like pins and needles)   Subjective:   Alicia Blair is a 46 y.o. very pleasant female patient with Body mass index is 31.09 kg/m. who presents with the following:  ST x 3 days ago.   Severe sore throat since Monday.   Headache, and generally not feeling well.     Review of Systems is noted in the HPI, as appropriate  Objective:   BP 120/80   Pulse 89   Temp 98.8 F (37.1 C) (Oral)   Ht '5\' 7"'$  (1.702 m)   Wt 198 lb 8 oz (90 kg)   LMP 05/23/2022   SpO2 99%   BMI 31.09 kg/m   GEN: No acute distress; alert,appropriate. PULM: Breathing comfortably in no respiratory distress PSYCH: Normally interactive.   Laboratory and Imaging Data:  Assessment and Plan:   ***

## 2022-06-04 ENCOUNTER — Ambulatory Visit (INDEPENDENT_AMBULATORY_CARE_PROVIDER_SITE_OTHER): Payer: 59

## 2022-06-04 DIAGNOSIS — L400 Psoriasis vulgaris: Secondary | ICD-10-CM

## 2022-06-04 NOTE — Progress Notes (Signed)
Patient here today for xtrac treatment of psoriasis vulgaris.    Total Surface Area: 144cm2 Total Energy: 340.42J Service Code: 95747          Johnsie Kindred, RMA

## 2022-06-05 ENCOUNTER — Ambulatory Visit: Payer: 59

## 2022-06-06 ENCOUNTER — Other Ambulatory Visit: Payer: 59

## 2022-06-06 ENCOUNTER — Ambulatory Visit: Payer: 59

## 2022-06-06 DIAGNOSIS — Z881 Allergy status to other antibiotic agents status: Secondary | ICD-10-CM | POA: Diagnosis not present

## 2022-06-06 DIAGNOSIS — R350 Frequency of micturition: Secondary | ICD-10-CM

## 2022-06-06 DIAGNOSIS — Z885 Allergy status to narcotic agent status: Secondary | ICD-10-CM | POA: Diagnosis not present

## 2022-06-06 DIAGNOSIS — R3129 Other microscopic hematuria: Secondary | ICD-10-CM | POA: Diagnosis not present

## 2022-06-06 DIAGNOSIS — H25811 Combined forms of age-related cataract, right eye: Secondary | ICD-10-CM | POA: Diagnosis not present

## 2022-06-06 LAB — URINALYSIS, COMPLETE
Bilirubin, UA: NEGATIVE
Glucose, UA: NEGATIVE
Ketones, UA: NEGATIVE
Leukocytes,UA: NEGATIVE
Nitrite, UA: NEGATIVE
Protein,UA: NEGATIVE
Specific Gravity, UA: 1.02 (ref 1.005–1.030)
Urobilinogen, Ur: 0.2 mg/dL (ref 0.2–1.0)
pH, UA: 7.5 (ref 5.0–7.5)

## 2022-06-06 LAB — MICROSCOPIC EXAMINATION: Epithelial Cells (non renal): >10 /hpf — AB (ref 0–10)

## 2022-06-07 ENCOUNTER — Ambulatory Visit: Payer: 59

## 2022-06-26 ENCOUNTER — Other Ambulatory Visit: Payer: Self-pay

## 2022-07-03 ENCOUNTER — Encounter: Payer: Self-pay | Admitting: Obstetrics and Gynecology

## 2022-07-05 ENCOUNTER — Ambulatory Visit
Admission: RE | Admit: 2022-07-05 | Discharge: 2022-07-05 | Disposition: A | Discharge status: Home / Self Care | Payer: 59 | Source: Ambulatory Visit | Attending: Obstetrics and Gynecology | Admitting: Obstetrics and Gynecology

## 2022-07-05 DIAGNOSIS — R921 Mammographic calcification found on diagnostic imaging of breast: Secondary | ICD-10-CM

## 2022-07-13 ENCOUNTER — Other Ambulatory Visit: Payer: Self-pay

## 2022-07-30 ENCOUNTER — Ambulatory Visit: Payer: 59 | Admitting: Dermatology

## 2022-08-27 ENCOUNTER — Other Ambulatory Visit (HOSPITAL_COMMUNITY): Payer: Self-pay

## 2022-08-27 ENCOUNTER — Other Ambulatory Visit: Payer: Self-pay

## 2022-09-27 ENCOUNTER — Telehealth: Payer: Self-pay | Admitting: Internal Medicine

## 2022-09-27 DIAGNOSIS — D500 Iron deficiency anemia secondary to blood loss (chronic): Secondary | ICD-10-CM

## 2022-09-27 DIAGNOSIS — R5383 Other fatigue: Secondary | ICD-10-CM

## 2022-09-27 DIAGNOSIS — E538 Deficiency of other specified B group vitamins: Secondary | ICD-10-CM

## 2022-09-27 NOTE — Telephone Encounter (Signed)
Pt called in staying that as per her lab results back in November, the results were low and she was wondering if Dr. Darrick Huntsman can re-orders those lab again to check? She would like to check for  B-12, iron and TSH? Any questions or concern, she's available [phone].

## 2022-09-27 NOTE — Telephone Encounter (Signed)
I have pended for you approval.

## 2022-10-03 ENCOUNTER — Encounter: Payer: Self-pay | Admitting: Internal Medicine

## 2022-10-08 ENCOUNTER — Other Ambulatory Visit (INDEPENDENT_AMBULATORY_CARE_PROVIDER_SITE_OTHER): Payer: 59

## 2022-10-08 DIAGNOSIS — D500 Iron deficiency anemia secondary to blood loss (chronic): Secondary | ICD-10-CM | POA: Diagnosis not present

## 2022-10-08 DIAGNOSIS — E538 Deficiency of other specified B group vitamins: Secondary | ICD-10-CM

## 2022-10-08 DIAGNOSIS — R5383 Other fatigue: Secondary | ICD-10-CM | POA: Diagnosis not present

## 2022-10-09 LAB — VITAMIN B12: Vitamin B-12: 334 pg/mL (ref 211–911)

## 2022-10-09 LAB — TSH: TSH: 2.78 u[IU]/mL (ref 0.35–5.50)

## 2022-10-09 LAB — IRON: Iron: 51 ug/dL (ref 42–145)

## 2022-11-15 ENCOUNTER — Encounter: Payer: Self-pay | Admitting: Dermatology

## 2022-11-15 NOTE — Telephone Encounter (Signed)
Called patient and discussed information by phone.

## 2022-12-04 ENCOUNTER — Encounter: Payer: Self-pay | Admitting: Nurse Practitioner

## 2022-12-04 ENCOUNTER — Ambulatory Visit (INDEPENDENT_AMBULATORY_CARE_PROVIDER_SITE_OTHER): Payer: 59 | Admitting: Nurse Practitioner

## 2022-12-04 VITALS — BP 118/82 | HR 97 | Temp 97.7°F | Ht 67.0 in | Wt 203.2 lb

## 2022-12-04 DIAGNOSIS — R058 Other specified cough: Secondary | ICD-10-CM | POA: Diagnosis not present

## 2022-12-04 DIAGNOSIS — J321 Chronic frontal sinusitis: Secondary | ICD-10-CM | POA: Diagnosis not present

## 2022-12-04 MED ORDER — BENZONATATE 200 MG PO CAPS: 200.0000 mg | ORAL_CAPSULE | Freq: Two times a day (BID) | ORAL | 0 refills | Status: DC | PRN

## 2022-12-04 MED ORDER — AMOXICILLIN-POT CLAVULANATE 875-125 MG PO TABS: 1.0000 | ORAL_TABLET | Freq: Two times a day (BID) | ORAL | 0 refills | Status: DC

## 2022-12-04 MED ORDER — BUDESONIDE-FORMOTEROL FUMARATE 80-4.5 MCG/ACT IN AERO: 2.0000 | INHALATION_SPRAY | Freq: Two times a day (BID) | RESPIRATORY_TRACT | 3 refills | Status: DC

## 2022-12-04 NOTE — Progress Notes (Signed)
Established Patient Office Visit  Subjective:  Patient ID: Alicia Blair, female    DOB: Aug 26, 1975  Age: 47 y.o. MRN: [id number]  CC:  Chief Complaint  Patient presents with   Sinus Problem    Pressure in head for 4 days earache also    HPI  Alicia Blair presents for:  Sinus Problem This is a new problem. The current episode started in the past 7 days. The problem has been gradually worsening since onset. There has been no fever. Associated symptoms include congestion, coughing, headaches, a hoarse voice and sinus pressure. Pertinent negatives include no chills, neck pain, sneezing or sore throat. The treatment provided no relief.     Past Medical History:  Diagnosis Date   Anemia    with pregnancy   Asthma    as a child   Chicken pox    Complication of anesthesia    PONV (postoperative nausea and vomiting)     Past Surgical History:  Procedure Laterality Date   breast augmentation  2003   HERNIA REPAIR     LAPAROSCOPIC GASTRIC BANDING  2008   WISDOM TOOTH EXTRACTION      Family History  Problem Relation Age of Onset   Diabetes Mother    Cancer Mother 8       endometrial CA   Atrial fibrillation Mother    Hypertension Mother    Hyperlipidemia Mother    Diabetes Maternal Aunt    Diabetes Maternal Grandmother    Cancer Brother 21       testicular ca   Diabetes Brother     Social History   Socioeconomic History   Marital status: Married    Spouse name: Not on file   Number of children: Not on file   Years of education: Not on file   Highest education level: Not on file  Occupational History   Not on file  Tobacco Use   Smoking status: Never   Smokeless tobacco: Never  Vaping Use   Vaping Use: Never used  Substance and Sexual Activity   Alcohol use: Not Currently    Comment: social, none since pregnanc y   Drug use: No   Sexual activity: Yes    Birth control/protection: None  Other Topics Concern   Not on file  Social History Narrative    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   Social Determinants of Health   Financial Resource Strain: Not on file  Food Insecurity: Not on file  Transportation Needs: Not on file  Physical Activity: Not on file  Stress: Not on file  Social Connections: Not on file  Intimate Partner Violence: Not on file     Outpatient Medications Prior to Visit  Medication Sig Dispense Refill   calcipotriene (DOVONOX) 0.005 % cream Apply to indicated areas twice a day. 60 g 1   clobetasol cream (TEMOVATE) 0.05 % Apply 1 Application topically 2 (two) times daily.     Clobetasol Propionate 0.05 % shampoo Lather scalp x 5 minutes, rinse well. Use 2 x weekly. 118 mL 1   cyanocobalamin (VITAMIN B12) 1000 MCG/ML injection Inject 1 mL (1,000 mcg total) into the muscle once a week. Then monthly thereafter 4 mL 3   docusate sodium (COLACE) 100 MG capsule Take 100 mg by mouth daily.     famotidine (PEPCID) 20 MG tablet Take 20 mg by mouth as needed for heartburn or indigestion.     Iron, Ferrous Sulfate, 325 (65 Fe) MG TABS  Take 325 mg by mouth daily. 30 tablet 2   Syringe/Needle, Disp, (SYRINGE 3CC/25GX1") 25G X 1" 3 ML MISC Use for b12 injections 50 each 0   No facility-administered medications prior to visit.    Allergies  Allergen Reactions   Codeine Nausea And Vomiting and Nausea Only   Erythromycin Nausea And Vomiting    ROS Review of Systems  Constitutional:  Negative for chills.  HENT:  Positive for congestion, hoarse voice and sinus pressure. Negative for sneezing and sore throat.   Respiratory:  Positive for cough.   Musculoskeletal:  Negative for neck pain.  Neurological:  Positive for headaches.   Negative unless indicated in HPI.    Objective:    Physical Exam Constitutional:      Appearance: Normal appearance.  HENT:     Right Ear: Tympanic membrane normal. Tympanic membrane is not erythematous.     Left Ear: Tympanic membrane normal. Tympanic membrane is not erythematous.     Nose:      Right Turbinates: Not enlarged.     Left Turbinates: Not enlarged.     Right Sinus: Frontal sinus tenderness present.     Left Sinus: Frontal sinus tenderness present.     Mouth/Throat:     Mouth: Mucous membranes are moist.     Pharynx: No pharyngeal swelling, oropharyngeal exudate or posterior oropharyngeal erythema.     Tonsils: No tonsillar exudate.  Cardiovascular:     Rate and Rhythm: Normal rate and regular rhythm.  Pulmonary:     Effort: Pulmonary effort is normal.     Breath sounds: Normal breath sounds. No stridor. No wheezing.  Neurological:     General: No focal deficit present.     Mental Status: She is alert and oriented to person, place, and time. Mental status is at baseline.  Psychiatric:        Mood and Affect: Mood normal.        Behavior: Behavior normal.        Thought Content: Thought content normal.        Judgment: Judgment normal.     BP 118/82   Pulse 97   Temp 97.7 F (36.5 C) (Oral)   Ht 5\' 7"  (1.702 m)   Wt 203 lb 3.2 oz (92.2 kg)   SpO2 98%   BMI 31.83 kg/m  Wt Readings from Last 3 Encounters:  12/13/22 205 lb (93 kg)  12/04/22 203 lb 3.2 oz (92.2 kg)  05/30/22 198 lb 8 oz (90 kg)     Health Maintenance  Topic Date Due   COVID-19 Vaccine (4 - 2023-24 season) 02/16/2022   INFLUENZA VACCINE  01/17/2023   Fecal DNA (Cologuard)  05/16/2024   PAP SMEAR-Modifier  03/07/2025   DTaP/Tdap/Td (3 - Td or Tdap) 05/04/2031   Hepatitis C Screening  Completed   HIV Screening  Completed   HPV VACCINES  Aged Out    There are no preventive care reminders to display for this patient.  Lab Results  Component Value Date   TSH 2.78 10/08/2022   Lab Results  Component Value Date   WBC 6.5 05/08/2022   HGB 11.5 (L) 05/08/2022   HCT 34.5 (L) 05/08/2022   MCV 85.9 05/08/2022   PLT 291.0 05/08/2022   Lab Results  Component Value Date   NA 136 05/08/2022   K 3.7 05/08/2022   CO2 24 05/08/2022   GLUCOSE 74 05/08/2022   BUN 13 05/08/2022    CREATININE 0.62 05/08/2022   BILITOT  0.5 05/08/2022   ALKPHOS 45 05/08/2022   AST 14 05/08/2022   ALT 8 05/08/2022   PROT 6.9 05/08/2022   ALBUMIN 4.5 05/08/2022   CALCIUM 9.0 05/08/2022   ANIONGAP 9 05/14/2016   GFR 106.84 05/08/2022   Lab Results  Component Value Date   CHOL 194 05/08/2022   Lab Results  Component Value Date   HDL 60 05/08/2022   Lab Results  Component Value Date   LDLCALC 119 (H) 05/08/2022   Lab Results  Component Value Date   TRIG 48 05/08/2022   Lab Results  Component Value Date   CHOLHDL 3.2 05/08/2022   Lab Results  Component Value Date   HGBA1C 5.8 05/08/2022      Assessment & Plan:  Frontal sinusitis, unspecified chronicity Assessment & Plan: Will treat with amoxicillin, Tessalon Perles and Symbicort inhaler. Advised to use humidifier and steam. Increase fluid intake and rest. If symptoms not improving call the office back for further evaluation.   Other cough  Other orders -     Budesonide-Formoterol Fumarate; Inhale 2 puffs into the lungs 2 (two) times daily.  Dispense: 1 each; Refill: 3    Follow-up: No follow-ups on file.   Kara Dies, NP

## 2022-12-04 NOTE — Patient Instructions (Signed)
Rx sent to pharmacy. Take plain mucinex tablet OTC. Use steam or humidifier. Increase fluid intake.

## 2022-12-05 ENCOUNTER — Ambulatory Visit: Payer: 59 | Admitting: Family Medicine

## 2022-12-06 ENCOUNTER — Encounter: Payer: Self-pay | Admitting: Nurse Practitioner

## 2022-12-06 MED ORDER — ALBUTEROL SULFATE HFA 108 (90 BASE) MCG/ACT IN AERS: 2.0000 | INHALATION_SPRAY | Freq: Four times a day (QID) | RESPIRATORY_TRACT | 2 refills | Status: DC | PRN

## 2022-12-11 NOTE — Telephone Encounter (Signed)
Scheduled pt for appt in office on 12/13/22

## 2022-12-11 NOTE — Telephone Encounter (Signed)
Please inform patient she needs to be evaluated.

## 2022-12-13 ENCOUNTER — Ambulatory Visit (INDEPENDENT_AMBULATORY_CARE_PROVIDER_SITE_OTHER): Payer: 59

## 2022-12-13 ENCOUNTER — Ambulatory Visit (INDEPENDENT_AMBULATORY_CARE_PROVIDER_SITE_OTHER): Payer: 59 | Admitting: Nurse Practitioner

## 2022-12-13 ENCOUNTER — Other Ambulatory Visit: Payer: Self-pay

## 2022-12-13 ENCOUNTER — Encounter: Payer: Self-pay | Admitting: Nurse Practitioner

## 2022-12-13 VITALS — BP 134/82 | HR 94 | Temp 97.7°F | Ht 67.0 in | Wt 205.0 lb

## 2022-12-13 DIAGNOSIS — R062 Wheezing: Secondary | ICD-10-CM

## 2022-12-13 DIAGNOSIS — R432 Parageusia: Secondary | ICD-10-CM | POA: Diagnosis not present

## 2022-12-13 DIAGNOSIS — R430 Anosmia: Secondary | ICD-10-CM | POA: Diagnosis not present

## 2022-12-13 LAB — POC COVID19 BINAXNOW: SARS Coronavirus 2 Ag: NEGATIVE

## 2022-12-13 MED ORDER — PREDNISONE 10 MG PO TABS
ORAL_TABLET | ORAL | 0 refills | Status: DC
  Filled 2022-12-13: qty 17, 6d supply, fill #0

## 2022-12-13 MED ORDER — FLUTICASONE PROPIONATE 50 MCG/ACT NA SUSP
2.0000 | Freq: Every day | NASAL | 1 refills | Status: DC
  Filled 2022-12-13: qty 16, 30d supply, fill #0

## 2022-12-13 MED ORDER — DOXYCYCLINE HYCLATE 100 MG PO TABS
100.0000 mg | ORAL_TABLET | Freq: Two times a day (BID) | ORAL | 0 refills | Status: AC
  Filled 2022-12-13: qty 20, 10d supply, fill #0

## 2022-12-13 NOTE — Assessment & Plan Note (Addendum)
Wheezing bilaterally, oxygen saturation 99%. POCT COVID negative. Started on doxycycline, flonase nasal spray and prednisone tapering. Chest x-rays pending. Advised to take albuterol inhaler, Tessalon Perles and Mucinex plain. Increase fluid intake and use steam or humidifier. Would refer for ENT consultant if anosmia not getting better.

## 2022-12-13 NOTE — Progress Notes (Signed)
Established Patient Office Visit  Subjective:  Patient ID: Alicia Blair, female    DOB: [birth year]  Age: 47 y.o. MRN: [id number]  CC:  Chief Complaint  Patient presents with   Acute Visit    No taste or smell, head & chest congestion x 2 weeks Patient has finished Antibiotic and no improvement     HPI  Alicia Blair presents for lost of taste  and smell  URI  This is a recurrent problem. The current episode started 1 to 4 weeks ago. The problem has been gradually worsening. There has been no fever. Associated symptoms include congestion. Pertinent negatives include no ear pain, neck pain, sore throat or swollen glands. Associated symptoms comments: Chest congestion, lost of smell and taste. She has tried decongestant and increased fluids for the symptoms. The treatment provided mild relief.  She completed the course of Augmentin but the symptoms are not getting better.    Past Medical History:  Diagnosis Date   Anemia    with pregnancy   Asthma    as a child   Chicken pox    Complication of anesthesia    PONV (postoperative nausea and vomiting)     Past Surgical History:  Procedure Laterality Date   breast augmentation  2003   HERNIA REPAIR     LAPAROSCOPIC GASTRIC BANDING  2008   WISDOM TOOTH EXTRACTION      Family History  Problem Relation Age of Onset   Diabetes Mother    Cancer Mother 52       endometrial CA   Atrial fibrillation Mother    Hypertension Mother    Hyperlipidemia Mother    Diabetes Maternal Aunt    Diabetes Maternal Grandmother    Cancer Brother 21       testicular ca   Diabetes Brother     Social History   Socioeconomic History   Marital status: Married    Spouse name: Not on file   Number of children: Not on file   Years of education: Not on file   Highest education level: Not on file  Occupational History   Not on file  Tobacco Use   Smoking status: Never   Smokeless tobacco: Never  Vaping Use   Vaping Use: Never used   Substance and Sexual Activity   Alcohol use: Not Currently    Comment: social, none since pregnanc y   Drug use: No   Sexual activity: Yes    Birth control/protection: None  Other Topics Concern   Not on file  Social History Narrative   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   Social Determinants of Health   Financial Resource Strain: Not on file  Food Insecurity: Not on file  Transportation Needs: Not on file  Physical Activity: Not on file  Stress: Not on file  Social Connections: Not on file  Intimate Partner Violence: Not on file     Outpatient Medications Prior to Visit  Medication Sig Dispense Refill   albuterol (VENTOLIN HFA) 108 (90 Base) MCG/ACT inhaler Inhale 2 puffs into the lungs every 6 (six) hours as needed for wheezing or shortness of breath. 8 g 2   budesonide-formoterol (SYMBICORT) 80-4.5 MCG/ACT inhaler Inhale 2 puffs into the lungs 2 (two) times daily. 1 each 3   calcipotriene (DOVONOX) 0.005 % cream Apply to indicated areas twice a day. 60 g 1   clobetasol cream (TEMOVATE) 0.05 % Apply 1 Application topically 2 (two) times daily.  Clobetasol Propionate 0.05 % shampoo Lather scalp x 5 minutes, rinse well. Use 2 x weekly. 118 mL 1   cyanocobalamin (VITAMIN B12) 1000 MCG/ML injection Inject 1 mL (1,000 mcg total) into the muscle once a week. Then monthly thereafter 4 mL 3   docusate sodium (COLACE) 100 MG capsule Take 100 mg by mouth daily.     famotidine (PEPCID) 20 MG tablet Take 20 mg by mouth as needed for heartburn or indigestion.     Iron, Ferrous Sulfate, 325 (65 Fe) MG TABS Take 325 mg by mouth daily. 30 tablet 2   Syringe/Needle, Disp, (SYRINGE 3CC/25GX1") 25G X 1" 3 ML MISC Use for b12 injections 50 each 0   amoxicillin-clavulanate (AUGMENTIN) 875-125 MG tablet Take 1 tablet by mouth 2 (two) times daily. 20 tablet 0   benzonatate (TESSALON) 200 MG capsule Take 1 capsule (200 mg total) by mouth 2 (two) times daily as needed for cough. 20 capsule 0   OTEZLA 30 MG  TABS Take 1 tablet twice a day by oral route.     No facility-administered medications prior to visit.    Allergies  Allergen Reactions   Codeine Nausea And Vomiting and Nausea Only   Erythromycin Nausea And Vomiting    ROS Review of Systems  HENT:  Positive for congestion. Negative for ear pain and sore throat.   Musculoskeletal:  Negative for neck pain.   Negative unless indicated in HPI.    Objective:    Physical Exam Constitutional:      Appearance: Normal appearance.  HENT:     Right Ear: Tympanic membrane normal.     Left Ear: Tympanic membrane normal.     Nose: Congestion present.     Mouth/Throat:     Mouth: Mucous membranes are moist.     Pharynx: Oropharynx is clear. No oropharyngeal exudate or posterior oropharyngeal erythema.  Eyes:     Pupils: Pupils are equal, round, and reactive to light.  Cardiovascular:     Rate and Rhythm: Normal rate and regular rhythm.     Pulses: Normal pulses.     Heart sounds: Normal heart sounds.  Pulmonary:     Effort: Pulmonary effort is normal.     Breath sounds: Wheezing present.  Neurological:     General: No focal deficit present.     Mental Status: She is alert and oriented to person, place, and time. Mental status is at baseline.  Psychiatric:        Mood and Affect: Mood normal.        Behavior: Behavior normal.        Thought Content: Thought content normal.        Judgment: Judgment normal.     BP 134/82   Pulse 94   Temp 97.7 F (36.5 C)   Ht 5\' 7"  (1.702 m)   Wt 205 lb (93 kg)   SpO2 99%   BMI 32.11 kg/m  Wt Readings from Last 3 Encounters:  12/13/22 205 lb (93 kg)  12/04/22 203 lb 3.2 oz (92.2 kg)  05/30/22 198 lb 8 oz (90 kg)     Health Maintenance  Topic Date Due   COVID-19 Vaccine (4 - 2023-24 season) 02/16/2022   INFLUENZA VACCINE  01/17/2023   Fecal DNA (Cologuard)  05/16/2024   PAP SMEAR-Modifier  03/07/2025   DTaP/Tdap/Td (3 - Td or Tdap) 05/04/2031   Hepatitis C Screening   Completed   HIV Screening  Completed   HPV VACCINES  Aged Out  There are no preventive care reminders to display for this patient.  Lab Results  Component Value Date   TSH 2.78 10/08/2022   Lab Results  Component Value Date   WBC 6.5 05/08/2022   HGB 11.5 (L) 05/08/2022   HCT 34.5 (L) 05/08/2022   MCV 85.9 05/08/2022   PLT 291.0 05/08/2022   Lab Results  Component Value Date   NA 136 05/08/2022   K 3.7 05/08/2022   CO2 24 05/08/2022   GLUCOSE 74 05/08/2022   BUN 13 05/08/2022   CREATININE 0.62 05/08/2022   BILITOT 0.5 05/08/2022   ALKPHOS 45 05/08/2022   AST 14 05/08/2022   ALT 8 05/08/2022   PROT 6.9 05/08/2022   ALBUMIN 4.5 05/08/2022   CALCIUM 9.0 05/08/2022   ANIONGAP 9 05/14/2016   GFR 106.84 05/08/2022   Lab Results  Component Value Date   CHOL 194 05/08/2022   Lab Results  Component Value Date   HDL 60 05/08/2022   Lab Results  Component Value Date   LDLCALC 119 (H) 05/08/2022   Lab Results  Component Value Date   TRIG 48 05/08/2022   Lab Results  Component Value Date   CHOLHDL 3.2 05/08/2022   Lab Results  Component Value Date   HGBA1C 5.8 05/08/2022      Assessment & Plan:  Wheezing Assessment & Plan: Wheezing bilaterally, oxygen saturation 99%. POCT COVID negative. Started on doxycycline, flonase nasal spray and prednisone tapering. Chest x-rays pending. Advised to take albuterol inhaler, Tessalon Perles and Mucinex plain. Increase fluid intake and use steam or humidifier. Would refer for ENT consultant if anosmia not getting better.   Orders: -     DG Chest 2 View  Loss of taste -     POC COVID-19 BinaxNow  Loss of smell -     POC COVID-19 BinaxNow  Other orders -     predniSONE; Take 4 tablets ( total 40 mg) by mouth for 2 days; take 3 tablets ( total 30 mg) by mouth for 2 days; take 2 tablets ( total 20 mg) by mouth for 1 day; take 1 tablet ( total 10 mg) by mouth for 1 day.  Dispense: 17 tablet; Refill: 0 -      Doxycycline Hyclate; Take 1 tablet (100 mg total) by mouth 2 (two) times daily for 10 days.  Dispense: 20 tablet; Refill: 0 -     Fluticasone Propionate; Place 2 sprays into both nostrils daily.  Dispense: 9.9 mL; Refill: 1    Follow-up: Return if symptoms worsen or fail to improve.   Kara Dies, NP

## 2022-12-13 NOTE — Patient Instructions (Addendum)
Please go to lab for chest xray. Rx sent to pharmacy. Continue albuterol, mucinex and tessalon perles

## 2022-12-15 DIAGNOSIS — J321 Chronic frontal sinusitis: Secondary | ICD-10-CM | POA: Insufficient documentation

## 2022-12-15 NOTE — Assessment & Plan Note (Signed)
Will treat with amoxicillin, Tessalon Perles and Symbicort inhaler. Advised to use humidifier and steam. Increase fluid intake and rest. If symptoms not improving call the office back for further evaluation.

## 2023-02-07 IMAGING — RF DG UGI W SINGLE CM
1 series · 14 of 14 positions shown · non-contrast
Comparison: None.

CLINICAL DATA: History of gastric band. Preop evaluation before
removal of gastric band

EXAM:
UPPER GI SERIES WITH KUB
TECHNIQUE: After obtaining a scout radiograph a routine upper GI series was
performed using thin barium
FLUOROSCOPY TIME:  Fluoroscopy Time:  1 minutes 6 seconds
Radiation Exposure Index (if provided by the fluoroscopic device):
106 mGy
Number of Acquired Spot Images: 0

[Series 1: one shot · 0.14mm/px · 14 of 14 slices shown]
[im 1/14]
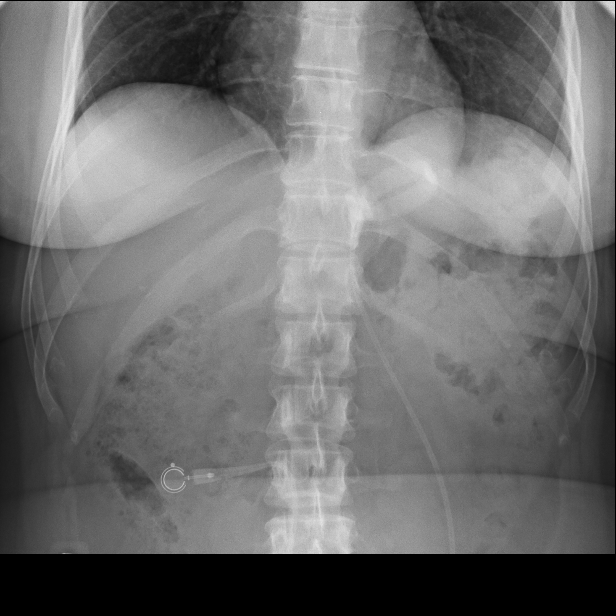
[im 2/14]
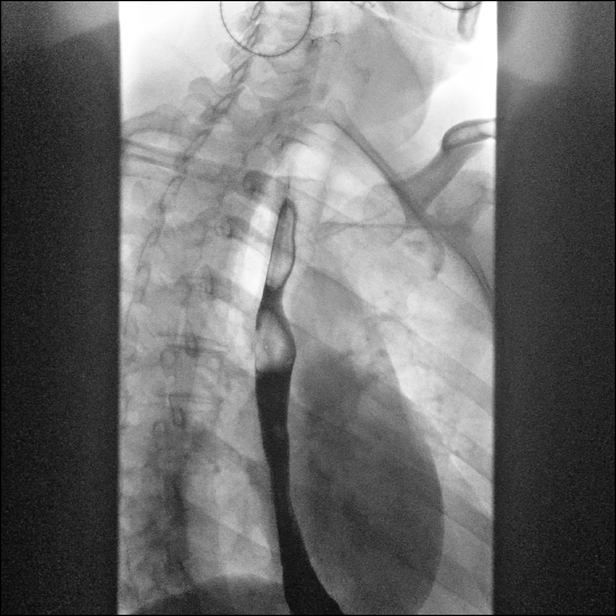
[im 3/14]
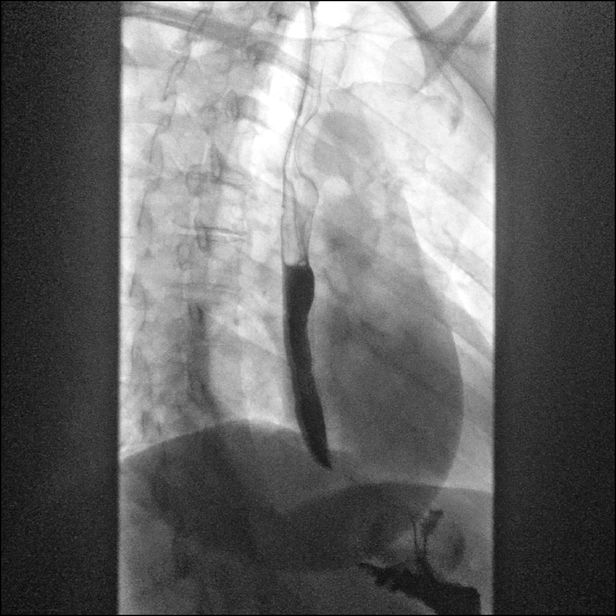
[im 4/14]
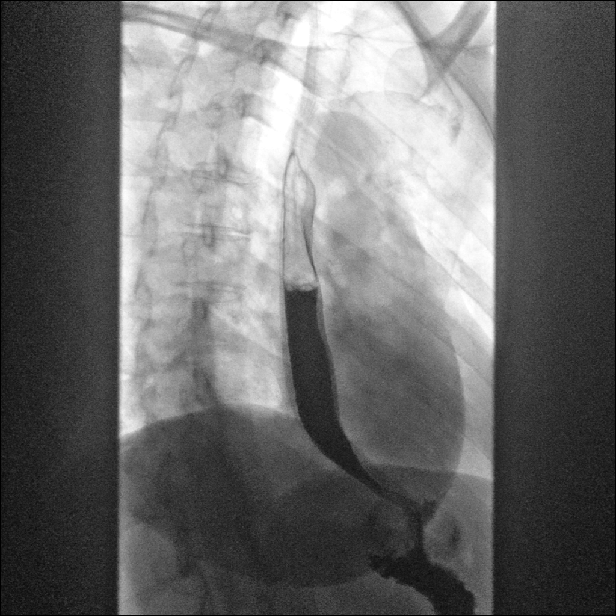
[im 5/14]
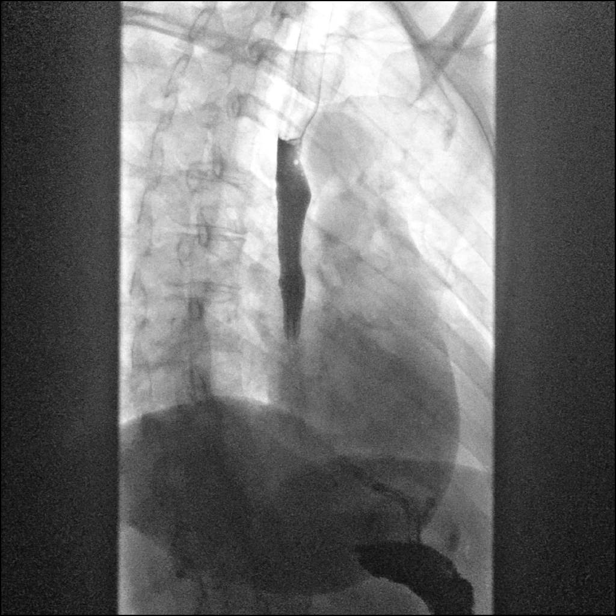
[im 6/14]
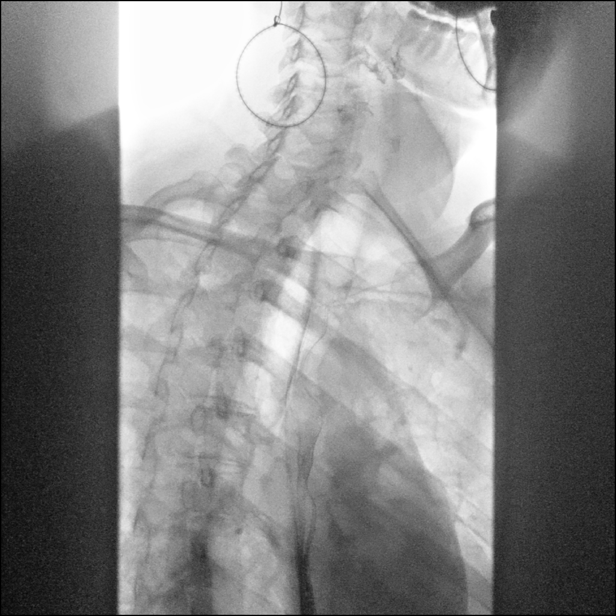
[im 7/14]
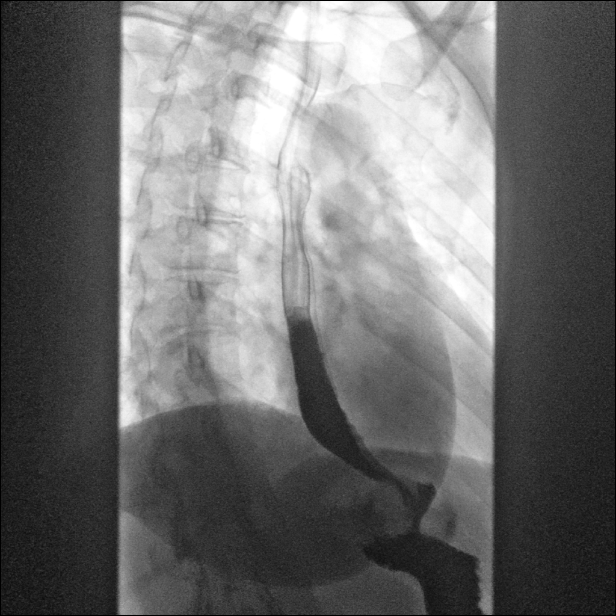
[im 8/14]
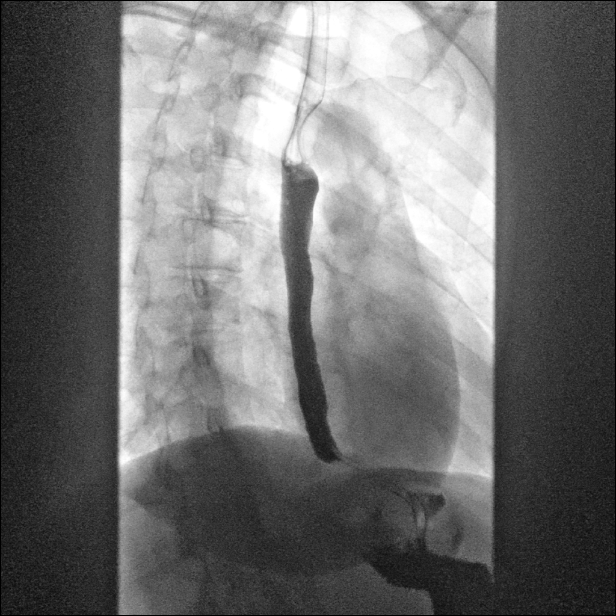
[im 9/14]
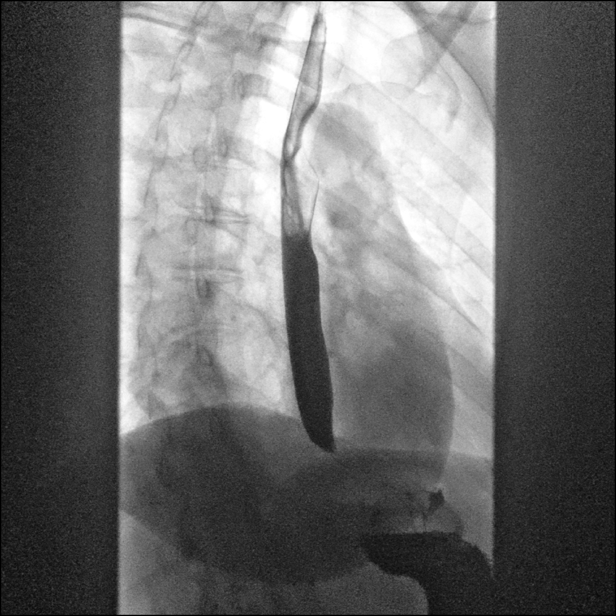
[im 10/14]
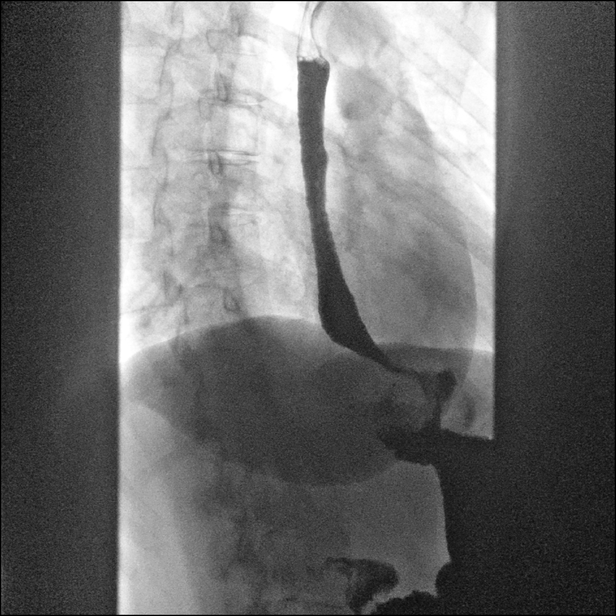
[im 11/14]
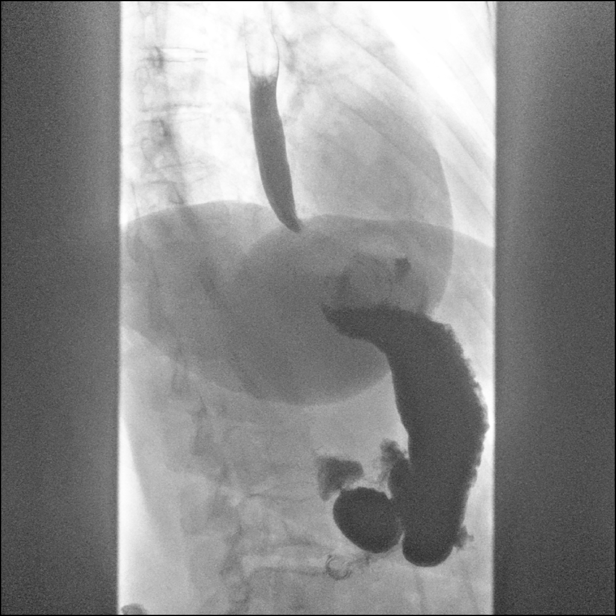
[im 12/14]
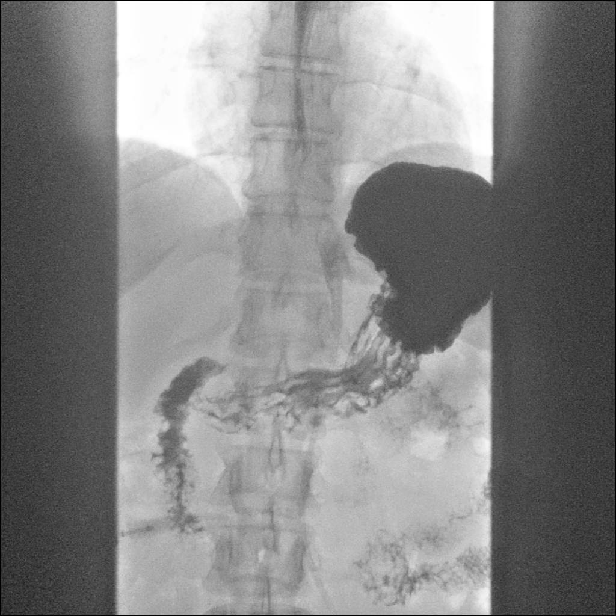
[im 13/14]
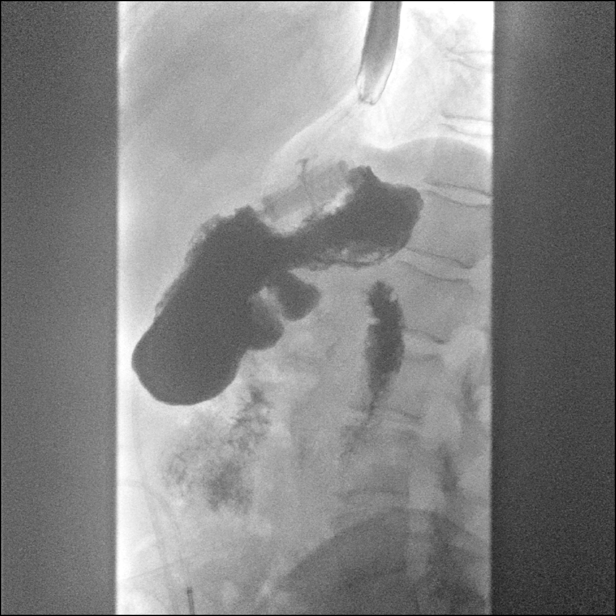
[im 14/14]
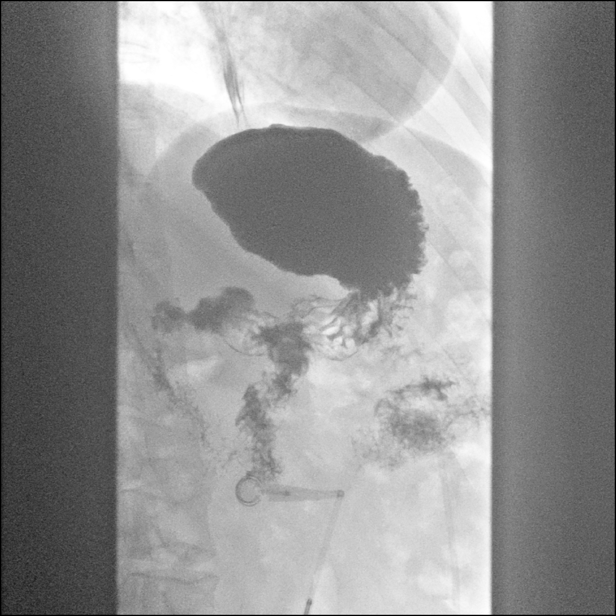

[14 of 14 positions shown; findings below may reference images not displayed]

FINDINGS: On the scout radiograph the gastric band is identified and appears
in appropriate position in the left upper quadrant of the abdomen in
the expected location of the GE junction. Visualized portions of the
gastric band catheter and reservoir appear intact.

The esophagus appears patent. No signs of stricture or mass.
Ingested barium easily passed through the banded segment of the
proximal stomach with opacification of the mid and distal stomach.
Prompt emptying of the stomach noted. The duodenal bulb and C loop
appear within normal limits. No signs of hiatal hernia identified.
No significant reflux visualized.
IMPRESSION: 1. Postsurgical changes consistent with previous gastric banding
identified. Normal anatomic appearance of the esophagus, mid and
distal stomach and duodenum.

## 2023-02-07 IMAGING — US US ABDOMEN COMPLETE
1 series · 14 of 25 positions shown · non-contrast
Comparison: None.

CLINICAL DATA: Right upper quadrant pain, status post gastric
bypass

EXAM:
ABDOMEN ULTRASOUND COMPLETE

[Series 1: us abdomen complete · 0.25mm/px · 14 of 94 slices shown]
[im 1/94]
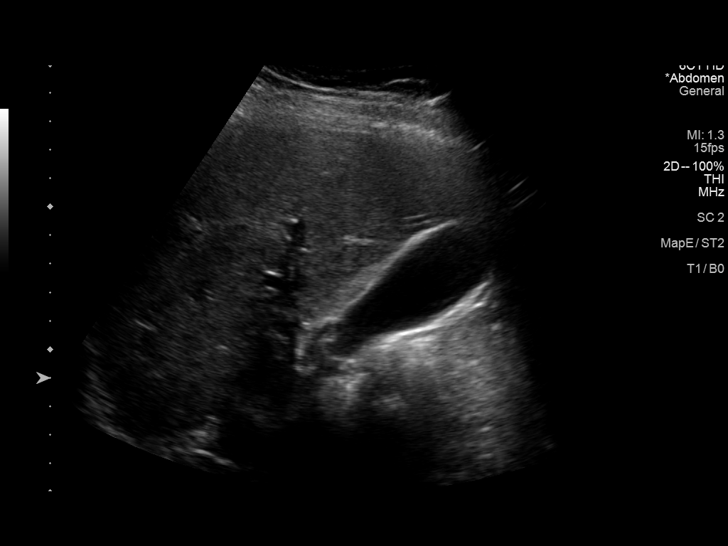
[im 8/94]
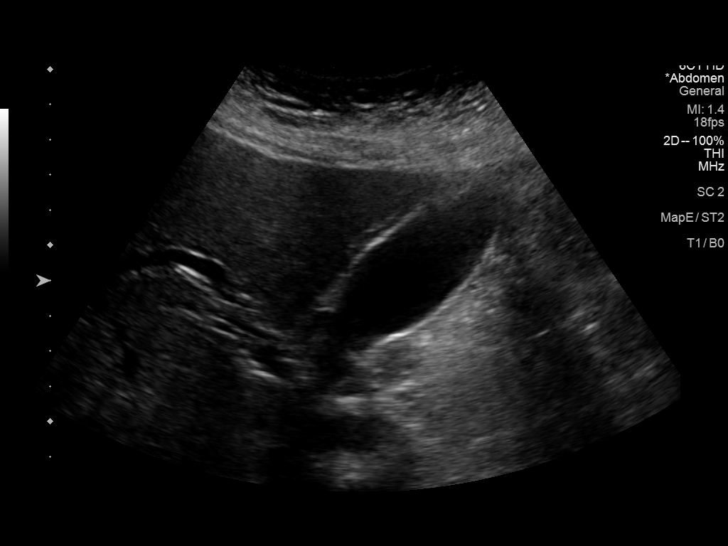
[im 16/94]
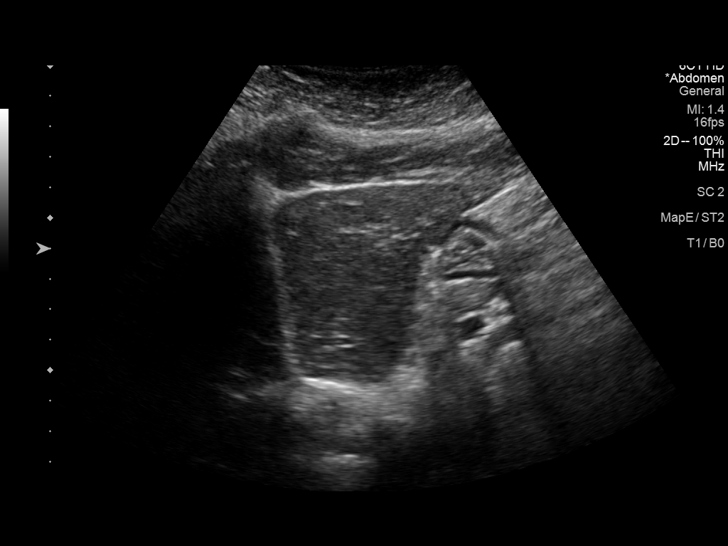
[im 24/94]
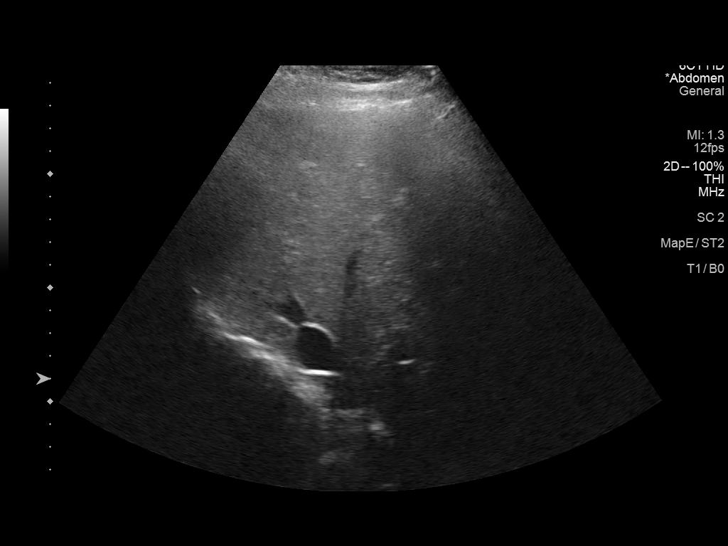
[im 32/94]
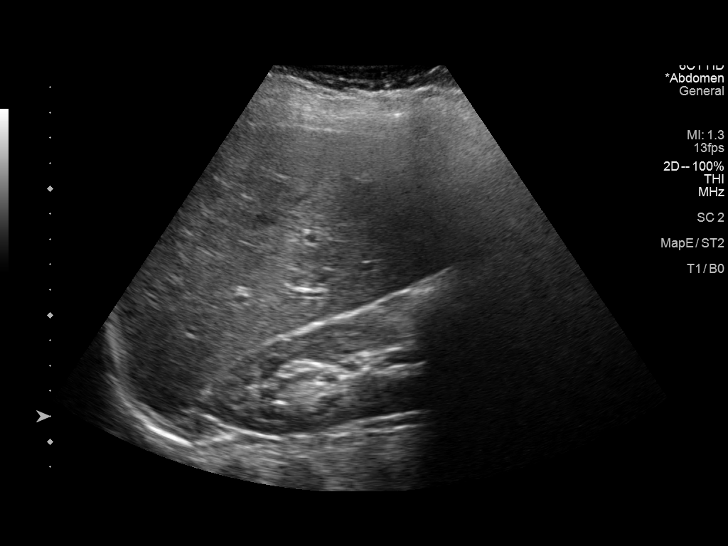
[im 35/94]
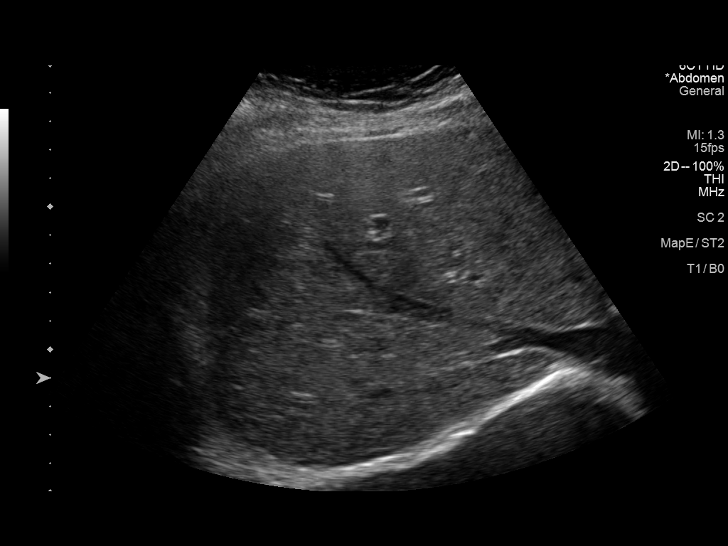
[im 43/94]
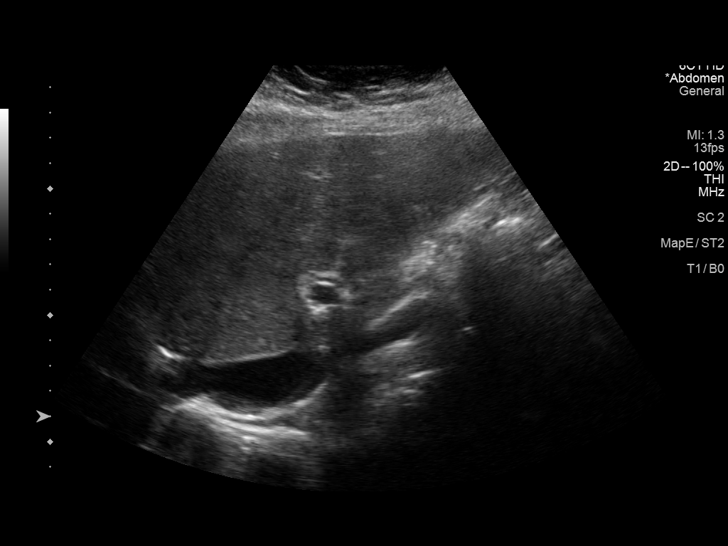
[im 51/94]
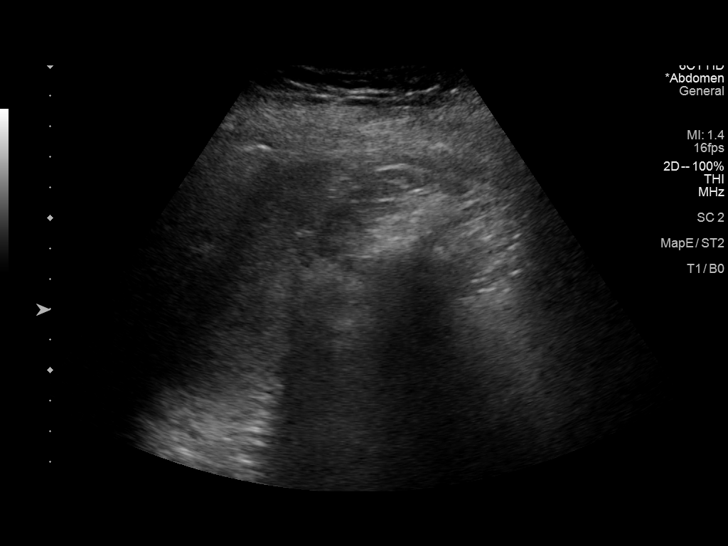
[im 59/94]
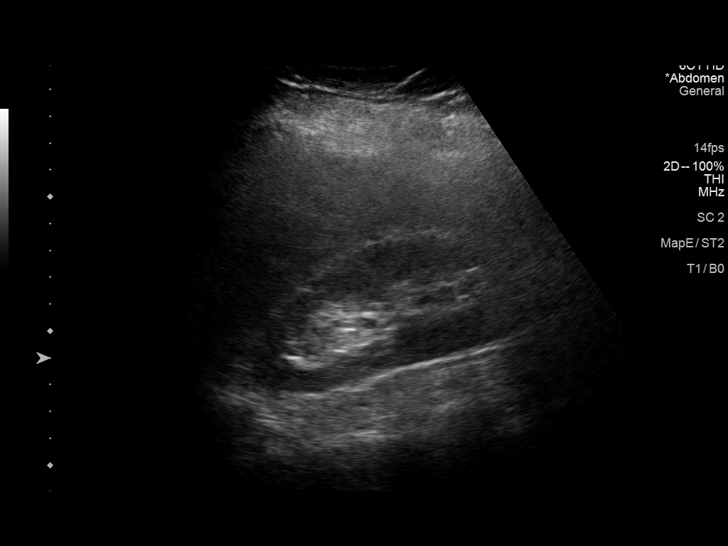
[im 63/94]
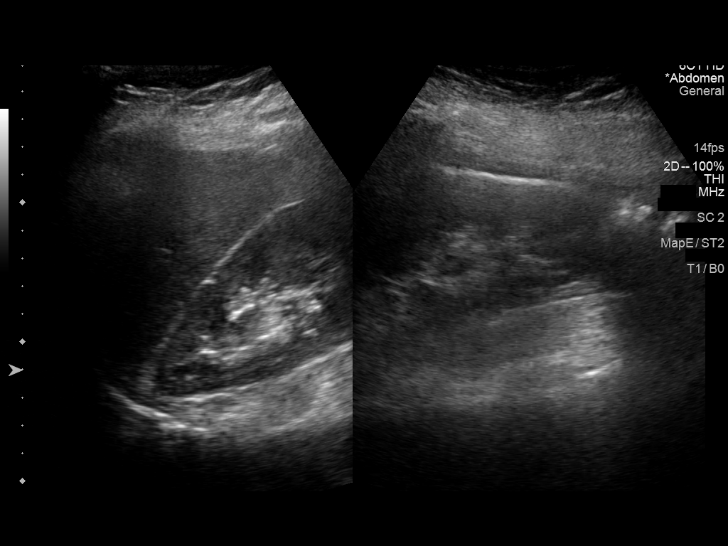
[im 70/94]
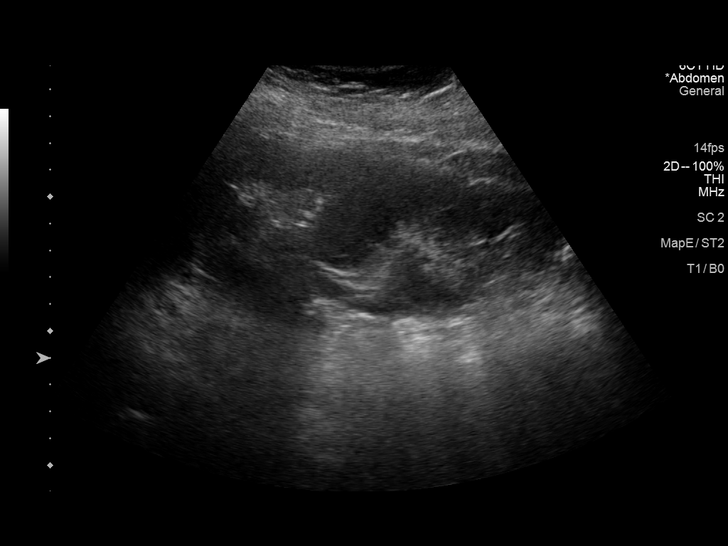
[im 78/94]
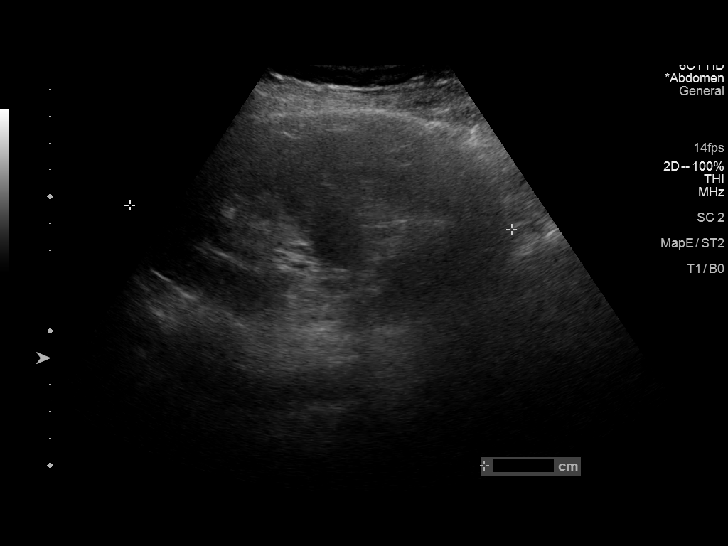
[im 86/94]
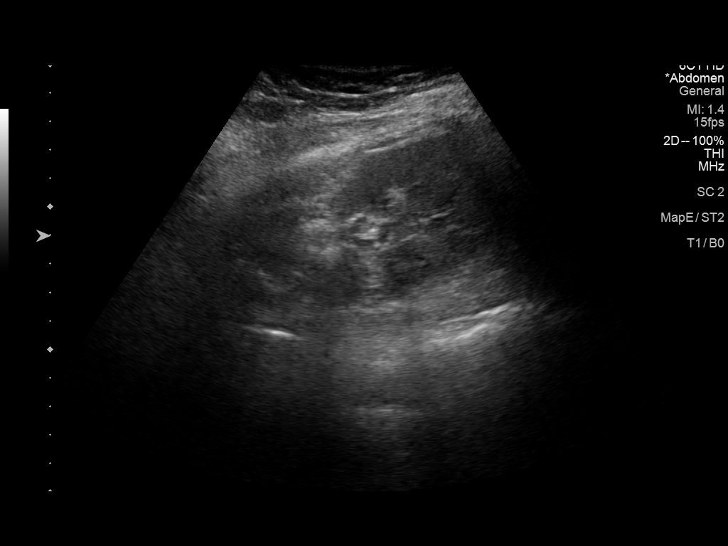
[im 94/94]
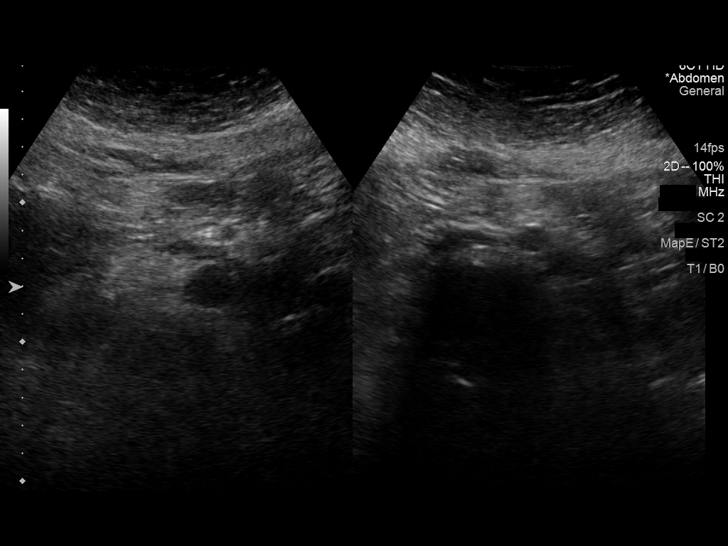

[14 of 25 positions shown; findings below may reference images not displayed]

FINDINGS: Gallbladder: No gallstones or wall thickening visualized. No
sonographic Murphy sign noted by sonographer.

Common bile duct: Diameter: 3 mm

Liver: No focal lesion identified. Within normal limits in
parenchymal echogenicity. Portal vein is patent on color Doppler
imaging with normal direction of blood flow towards the liver.

IVC: No abnormality visualized.

Pancreas: Visualized portion unremarkable.

Spleen: Size and appearance within normal limits.

Right Kidney: Length: 12.2. Echogenicity within normal limits. No
mass or hydronephrosis visualized.

Left Kidney: Length: 14.3. Echogenicity within normal limits. No
mass or hydronephrosis visualized.

Abdominal aorta: No aneurysm visualized.

Other findings: None.
IMPRESSION: Normal ultrasound examination of the abdomen. No ultrasound findings
to explain pain. Consider CT or MRI to further evaluate otherwise
unexplained abdominal pain.

## 2023-02-11 ENCOUNTER — Other Ambulatory Visit: Payer: Self-pay

## 2023-02-12 ENCOUNTER — Other Ambulatory Visit: Payer: Self-pay

## 2023-02-12 MED ORDER — METRONIDAZOLE 1 % EX GEL
1.0000 | Freq: Every evening | CUTANEOUS | 1 refills | Status: AC
  Filled 2023-02-12: qty 60, 30d supply, fill #0

## 2023-02-12 MED ORDER — CLOBETASOL PROPIONATE 0.05 % EX SHAM
1.0000 | MEDICATED_SHAMPOO | CUTANEOUS | 1 refills | Status: AC
  Filled 2023-02-12: qty 118, 30d supply, fill #0
  Filled 2024-01-28: qty 118, 30d supply, fill #1

## 2023-02-12 MED ORDER — CLOBETASOL PROPIONATE 0.05 % EX CREA
1.0000 | TOPICAL_CREAM | Freq: Two times a day (BID) | CUTANEOUS | 1 refills | Status: DC
  Filled 2023-02-12: qty 30, 30d supply, fill #0

## 2023-02-12 MED ORDER — CLOBETASOL PROPIONATE 0.05 % EX SOLN
1.0000 | Freq: Two times a day (BID) | CUTANEOUS | 1 refills | Status: AC | PRN
  Filled 2023-02-12: qty 50, 25d supply, fill #0
  Filled 2023-08-01: qty 50, 25d supply, fill #1

## 2023-02-15 ENCOUNTER — Other Ambulatory Visit: Payer: Self-pay

## 2023-02-15 MED ORDER — AZO BORIC ACID 600 MG VA SUPP: VAGINAL | 0 refills | Status: DC

## 2023-02-15 MED ORDER — TERCONAZOLE 0.8 % VA CREA
TOPICAL_CREAM | VAGINAL | 0 refills | Status: DC
  Filled 2023-02-15: qty 20, 3d supply, fill #0

## 2023-02-19 ENCOUNTER — Other Ambulatory Visit: Payer: Self-pay

## 2023-03-04 ENCOUNTER — Other Ambulatory Visit: Payer: Self-pay

## 2023-03-04 MED ORDER — KETOROLAC TROMETHAMINE 10 MG PO TABS
10.0000 mg | ORAL_TABLET | Freq: Four times a day (QID) | ORAL | 0 refills | Status: DC | PRN
  Filled 2023-03-04: qty 20, 5d supply, fill #0

## 2023-03-04 MED ORDER — TRAMADOL HCL 50 MG PO TABS
50.0000 mg | ORAL_TABLET | Freq: Four times a day (QID) | ORAL | 0 refills | Status: DC | PRN
  Filled 2023-03-04: qty 10, 3d supply, fill #0

## 2023-05-10 ENCOUNTER — Ambulatory Visit (INDEPENDENT_AMBULATORY_CARE_PROVIDER_SITE_OTHER): Payer: 59 | Admitting: Internal Medicine

## 2023-05-10 ENCOUNTER — Encounter: Payer: Self-pay | Admitting: Internal Medicine

## 2023-05-10 ENCOUNTER — Other Ambulatory Visit: Payer: Self-pay

## 2023-05-10 VITALS — BP 116/80 | HR 82 | Ht 67.0 in | Wt 206.6 lb

## 2023-05-10 DIAGNOSIS — Z0000 Encounter for general adult medical examination without abnormal findings: Secondary | ICD-10-CM | POA: Diagnosis not present

## 2023-05-10 DIAGNOSIS — Z1231 Encounter for screening mammogram for malignant neoplasm of breast: Secondary | ICD-10-CM

## 2023-05-10 DIAGNOSIS — E66811 Obesity, class 1: Secondary | ICD-10-CM

## 2023-05-10 DIAGNOSIS — D509 Iron deficiency anemia, unspecified: Secondary | ICD-10-CM

## 2023-05-10 DIAGNOSIS — E538 Deficiency of other specified B group vitamins: Secondary | ICD-10-CM

## 2023-05-10 DIAGNOSIS — R7303 Prediabetes: Secondary | ICD-10-CM

## 2023-05-10 DIAGNOSIS — F40243 Fear of flying: Secondary | ICD-10-CM

## 2023-05-10 DIAGNOSIS — L409 Psoriasis, unspecified: Secondary | ICD-10-CM

## 2023-05-10 DIAGNOSIS — N921 Excessive and frequent menstruation with irregular cycle: Secondary | ICD-10-CM

## 2023-05-10 LAB — CBC WITH DIFFERENTIAL/PLATELET
Basophils Absolute: 0 10*3/uL (ref 0.0–0.1)
Basophils Relative: 0.9 % (ref 0.0–3.0)
Eosinophils Absolute: 0.2 10*3/uL (ref 0.0–0.7)
Eosinophils Relative: 3.3 % (ref 0.0–5.0)
HCT: 41.3 % (ref 36.0–46.0)
Hemoglobin: 13.4 g/dL (ref 12.0–15.0)
Lymphocytes Relative: 28.7 % (ref 12.0–46.0)
Lymphs Abs: 1.4 10*3/uL (ref 0.7–4.0)
MCHC: 32.4 g/dL (ref 30.0–36.0)
MCV: 84.4 fL (ref 78.0–100.0)
Monocytes Absolute: 0.5 10*3/uL (ref 0.1–1.0)
Monocytes Relative: 10.1 % (ref 3.0–12.0)
Neutro Abs: 2.8 10*3/uL (ref 1.4–7.7)
Neutrophils Relative %: 57 % (ref 43.0–77.0)
Platelets: 242 10*3/uL (ref 150.0–400.0)
RBC: 4.9 Mil/uL (ref 3.87–5.11)
RDW: 15.7 % — ABNORMAL HIGH (ref 11.5–15.5)
WBC: 5 10*3/uL (ref 4.0–10.5)

## 2023-05-10 LAB — COMPREHENSIVE METABOLIC PANEL
ALT: 26 U/L (ref 0–35)
AST: 22 U/L (ref 0–37)
Albumin: 4.5 g/dL (ref 3.5–5.2)
Alkaline Phosphatase: 43 U/L (ref 39–117)
BUN: 10 mg/dL (ref 6–23)
CO2: 31 meq/L (ref 19–32)
Calcium: 9.6 mg/dL (ref 8.4–10.5)
Chloride: 100 meq/L (ref 96–112)
Creatinine, Ser: 0.67 mg/dL (ref 0.40–1.20)
GFR: 104.13 mL/min (ref >60.00)
Glucose, Bld: 104 mg/dL — ABNORMAL HIGH (ref 70–99)
Potassium: 4.1 meq/L (ref 3.5–5.1)
Sodium: 138 meq/L (ref 135–145)
Total Bilirubin: 0.4 mg/dL (ref 0.2–1.2)
Total Protein: 7.2 g/dL (ref 6.0–8.3)

## 2023-05-10 LAB — B12 AND FOLATE PANEL
Folate: >24.2 ng/mL (ref >5.9)
Vitamin B-12: 1524 pg/mL — ABNORMAL HIGH (ref 211–911)

## 2023-05-10 LAB — LIPID PANEL
Cholesterol: 216 mg/dL — ABNORMAL HIGH (ref 0–200)
HDL: 55.1 mg/dL (ref >39.00)
LDL Cholesterol: 142 mg/dL — ABNORMAL HIGH (ref 0–99)
NonHDL: 161
Total CHOL/HDL Ratio: 4
Triglycerides: 97 mg/dL (ref 0.0–149.0)
VLDL: 19.4 mg/dL (ref 0.0–40.0)

## 2023-05-10 LAB — LDL CHOLESTEROL, DIRECT: Direct LDL: 138 mg/dL

## 2023-05-10 LAB — TSH: TSH: 3.29 u[IU]/mL (ref 0.35–5.50)

## 2023-05-10 LAB — HEMOGLOBIN A1C: Hgb A1c MFr Bld: 6.2 % (ref 4.6–6.5)

## 2023-05-10 MED ORDER — DIAZEPAM 5 MG PO TABS
5.0000 mg | ORAL_TABLET | Freq: Two times a day (BID) | ORAL | 1 refills | Status: DC | PRN
Start: 2023-05-10 — End: 2024-02-21
  Filled 2023-05-10 – 2023-05-29 (×2): qty 30, 15d supply, fill #0

## 2023-05-10 NOTE — Assessment & Plan Note (Addendum)
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  Nnow having psoriasis not confined to scalp anymore.  Using clobetasol .  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

## 2023-05-10 NOTE — Progress Notes (Unsigned)
Patient ID: Alicia Blair, female    DOB: 1975/10/19  Age: 47 y.o. MRN: [id number]  The patient is here for foland management of other chronic and acute problems.   The risk factors are reflected in the social history.  The roster of all physicians providing medical care to patient - is listed in the Snapshot section of the chart.  Activities of daily living:  The patient is 100% independent in all ADLs: dressing, toileting, feeding as well as independent mobility  Home safety : The patient has smoke detectors in the home. They wear seatbelts.  There are no firearms at home. There is no violence in the home.   There is no risks for hepatitis, STDs or HIV. There is no   history of blood transfusion. They have no travel history to infectious disease endemic areas of the world.  The patient has seen their dentist in the last six month. They have seen their eye doctor in the last year. They admit to slight hearing difficulty with regard to whispered voices and some television programs.  They have deferred audiologic testing in the last year.  They do not  have excessive sun exposure. Discussed the need for sun protection: hats, long sleeves and use of sunscreen if there is significant sun exposure.   Diet: the importance of a healthy diet is discussed. They do have a healthy diet.  The benefits of regular aerobic exercise were discussed. Alicia Blair walks 5 days per week and does circuit training  2 days per week  with her 12 yr old daughter .  Depression screen: there are no signs or vegative symptoms of depression- irritability, change in appetite, anhedonia, sadness/tearfullness.  Cognitive assessment: the patient manages all their financial and personal affairs and is actively engaged. They could relate day,date,year and events; recalled 2/3 objects at 3 minutes; performed clock-face test normally.  The following portions of the patient's history were reviewed and updated as appropriate: allergies,  current medications, past family history, past medical history,  past surgical history, past social history  and problem list.  Visual acuity was not assessed per patient preference since Alicia Blair has regular follow up with her ophthalmologist. Hearing and body mass index were assessed and reviewed.   During the course of the visit the patient was educated and counseled about appropriate screening and preventive services including : fall prevention , diabetes screening, nutrition counseling, colorectal cancer screening, and recommended immunizations.    CC: The primary encounter diagnosis was Encounter for preventive health examination. Diagnoses of Iron deficiency anemia, unspecified iron deficiency anemia type, Obesity (BMI 30.0-34.9), Encounter for screening mammogram for malignant neoplasm of breast, and Psoriasis were also pertinent to this visit.  1) hsistory of a blation in Sept 2023 due to menorrhagia  .   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  Last injection sept 2024.  Using 5000 mcg of sublingual  since then    3)   History Samary has a past medical history of Anemia, Asthma, Chicken pox, Complication of anesthesia, and PONV (postoperative nausea and vomiting).   Alicia Blair has a past surgical history that includes Laparoscopic gastric banding (2008); breast augmentation (2003); Hernia repair; and Wisdom tooth extraction.   Her family history includes Atrial fibrillation in her mother; Cancer (age of onset: 37) in her brother; Cancer (age of onset: 24) in her mother; Diabetes in her brother, maternal aunt, maternal grandmother, and mother; Hyperlipidemia in  her mother; Hypertension in her mother.Alicia Blair reports that Alicia Blair has never smoked. Alicia Blair has never used smokeless tobacco. Alicia Blair reports that Alicia Blair does not currently use alcohol. Alicia Blair reports that Alicia Blair does not use drugs.  Outpatient Medications Prior to Visit  Medication Sig Dispense Refill  . calcipotriene  (DOVONOX) 0.005 % cream Apply to indicated areas twice a day. 60 g 1  . clobetasol (TEMOVATE) 0.05 % external solution Apply 1 Application topically to psoriasis on scalp 2 (two) times daily as needed. 50 mL 1  . clobetasol cream (TEMOVATE) 0.05 % Apply 1 Application topically to rash 2 (two) times daily until clear. 45 g 1  . Clobetasol Propionate 0.05 % shampoo Apply 1 Application topically 2 (two) times a week. Lather scalp for 5 minutes and rinse well. 118 mL 1  . Cyanocobalamin (VITAMIN B-12) 1000 MCG SUBL Place 1,000 mcg under the tongue daily at 12 noon.    . famotidine (PEPCID) 20 MG tablet Take 20 mg by mouth as needed for heartburn or indigestion.    . Iron, Ferrous Sulfate, 325 (65 Fe) MG TABS Take 325 mg by mouth daily. 30 tablet 2  . metroNIDAZOLE (METROGEL) 1 % gel Apply 1 Application topically to nose and cheeks Nightly. 60 g 1  . albuterol (VENTOLIN HFA) 108 (90 Base) MCG/ACT inhaler Inhale 2 puffs into the lungs every 6 (six) hours as needed for wheezing or shortness of breath. (Patient not taking: Reported on 05/10/2023) 8 g 2  . budesonide-formoterol (SYMBICORT) 80-4.5 MCG/ACT inhaler Inhale 2 puffs into the lungs 2 (two) times daily. (Patient not taking: Reported on 05/10/2023) 1 each 3  . clobetasol cream (TEMOVATE) 0.05 % Apply 1 Application topically 2 (two) times daily. (Patient not taking: Reported on 05/10/2023)    . Clobetasol Propionate 0.05 % shampoo Lather scalp x 5 minutes, rinse well. Use 2 x weekly. (Patient not taking: Reported on 05/10/2023) 118 mL 1  . cyanocobalamin (VITAMIN B12) 1000 MCG/ML injection Inject 1 mL (1,000 mcg total) into the muscle once a week. Then monthly thereafter (Patient not taking: Reported on 05/10/2023) 4 mL 3  . docusate sodium (COLACE) 100 MG capsule Take 100 mg by mouth daily. (Patient not taking: Reported on 05/10/2023)    . fluticasone (FLONASE) 50 MCG/ACT nasal spray Place 2 sprays into both nostrils daily. (Patient not taking: Reported  on 05/10/2023) 16 g 1  . OTEZLA 30 MG TABS Take 1 tablet twice a day by oral route. (Patient not taking: Reported on 05/10/2023)    . predniSONE (DELTASONE) 10 MG tablet Take 4 tablets ( total 40 mg) by mouth for 2 days; take 3 tablets ( total 30 mg) by mouth for 2 days; take 2 tablets ( total 20 mg) by mouth for 1 day; take 1 tablet ( total 10 mg) by mouth for 1 day. (Patient not taking: Reported on 05/10/2023) 17 tablet 0  . Syringe/Needle, Disp, (SYRINGE 3CC/25GX1") 25G X 1" 3 ML MISC Use for b12 injections (Patient not taking: Reported on 05/10/2023) 50 each 0   No facility-administered medications prior to visit.    Review of Systems  Objective:  BP 116/80   Pulse 82   Ht 5\' 7"  (1.702 m)   Wt 206 lb 9.6 oz (93.7 kg)   SpO2 98%   BMI 32.36 kg/m   Physical Exam Vitals reviewed.  Constitutional:      General: Alicia Blair is not in acute distress.    Appearance: Normal appearance. Alicia Blair is normal weight. Alicia Blair is  not ill-appearing, toxic-appearing or diaphoretic.  HENT:     Head: Normocephalic.  Eyes:     General: No scleral icterus.       Right eye: No discharge.        Left eye: No discharge.     Conjunctiva/sclera: Conjunctivae normal.  Cardiovascular:     Rate and Rhythm: Normal rate and regular rhythm.     Heart sounds: Normal heart sounds.  Pulmonary:     Effort: Pulmonary effort is normal. No respiratory distress.     Breath sounds: Normal breath sounds.  Musculoskeletal:        General: Normal range of motion.  Skin:    General: Skin is warm and dry.  Neurological:     General: No focal deficit present.     Mental Status: Alicia Blair is alert and oriented to person, place, and time. Mental status is at baseline.  Psychiatric:        Mood and Affect: Mood normal.        Behavior: Behavior normal.        Thought Content: Thought content normal.        Judgment: Judgment normal.   Assessment & Plan:  Encounter for preventive health examination  Iron deficiency anemia,  unspecified iron deficiency anemia type  Obesity (BMI 30.0-34.9)  Encounter for screening mammogram for malignant neoplasm of breast  Psoriasis Assessment & Plan: Stopped Otesla 8 months ago .  Nnow having psoriasis not confined to scalp anymore.  Using clobetasol .  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        I provided 40 minutes of  face-to-face time during this encounter reviewing patient's current problems and past surgeries,  recent labs and imaging studies, providing counseling on the above mentioned problems , and coordination  of care .   Follow-up: No follow-ups on file.   Sherlene Shams, MD

## 2023-05-12 DIAGNOSIS — R7303 Prediabetes: Secondary | ICD-10-CM | POA: Insufficient documentation

## 2023-05-12 NOTE — Assessment & Plan Note (Signed)

## 2023-05-12 NOTE — Assessment & Plan Note (Addendum)
Resolved with endometrial ablation   Lab Results  Component Value Date   WBC 5.0 05/10/2023   HGB 13.4 05/10/2023   HCT 41.3 05/10/2023   MCV 84.4 05/10/2023   PLT 242.0 05/10/2023

## 2023-05-12 NOTE — Assessment & Plan Note (Signed)
Resoved , continue sublingual  supplementation

## 2023-05-12 NOTE — Assessment & Plan Note (Signed)
I have addressed  A1c aand recommended wt loss of 10% of body weight over the next 6 months using a low fat, fruit/vegetable based [place] diet and regular exercise a minimum of 5 days per week.

## 2023-05-12 NOTE — Assessment & Plan Note (Addendum)
With prediabetes by current A1c .  I have encouraged her  to reduce her   BMI by 10% over the next 6 months using a low glycemic index diet and regular exercise a minimum of 5 days per week.    Lab Results  Component Value Date   HGBA1C 6.2 05/10/2023

## 2023-05-12 NOTE — Assessment & Plan Note (Signed)
Resolved with endometrial ablation

## 2023-05-21 ENCOUNTER — Other Ambulatory Visit: Payer: Self-pay | Admitting: Obstetrics and Gynecology

## 2023-05-21 ENCOUNTER — Other Ambulatory Visit: Payer: Self-pay

## 2023-05-21 DIAGNOSIS — Z1231 Encounter for screening mammogram for malignant neoplasm of breast: Secondary | ICD-10-CM

## 2023-05-21 DIAGNOSIS — R921 Mammographic calcification found on diagnostic imaging of breast: Secondary | ICD-10-CM

## 2023-05-23 ENCOUNTER — Other Ambulatory Visit: Payer: Self-pay

## 2023-05-23 ENCOUNTER — Encounter: Payer: Self-pay | Admitting: Nurse Practitioner

## 2023-05-23 ENCOUNTER — Ambulatory Visit (INDEPENDENT_AMBULATORY_CARE_PROVIDER_SITE_OTHER): Payer: 59 | Admitting: Nurse Practitioner

## 2023-05-23 VITALS — BP 114/82 | HR 78 | Temp 98.0°F | Ht 67.0 in | Wt 209.4 lb

## 2023-05-23 DIAGNOSIS — J40 Bronchitis, not specified as acute or chronic: Secondary | ICD-10-CM | POA: Diagnosis not present

## 2023-05-23 MED ORDER — DOXYCYCLINE HYCLATE 100 MG PO TABS
100.0000 mg | ORAL_TABLET | Freq: Two times a day (BID) | ORAL | 0 refills | Status: DC
  Filled 2023-05-23: qty 14, 7d supply, fill #0

## 2023-05-23 MED ORDER — DEXAMETHASONE SODIUM PHOSPHATE 10 MG/ML IJ SOLN
8.0000 mg | Freq: Once | INTRAMUSCULAR | Status: AC
  Administered 2023-05-23: 8 mg via INTRAMUSCULAR

## 2023-05-23 MED ORDER — PSEUDOEPH-BROMPHEN-DM 30-2-10 MG/5ML PO SYRP
5.0000 mL | ORAL_SOLUTION | Freq: Four times a day (QID) | ORAL | 0 refills | Status: DC | PRN
  Filled 2023-05-23: qty 120, 3d supply, fill #0

## 2023-05-23 NOTE — Progress Notes (Signed)
Provider gave orders to give pt 8 mg of dexamethasone. 0.8 mL was drawn up from 10mg /mL vial. 0.2 mL was wasted. Pt tolerated injection well in the right anterior thigh.

## 2023-05-23 NOTE — Progress Notes (Signed)
Bethanie Dicker, NP-C Phone: [phone]  RICKEYA Blair is a 47 y.o. female who presents today for cough.   Discussed the use of AI scribe software for clinical note transcription with the patient, who gave verbal consent to proceed.  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 They describe the sensation as 'sitting right here' in the chest, and despite attempts to clear it, the congestion persists. The patient has been self-medicating with over-the-counter decongestants and Nyquil, but reports feeling 'kind of crappy.' They also mention experiencing burning eyes and occasional headaches, but deny any fevers, shortness of breath, or sinus congestion.  The patient's throat is slightly raw, which they attribute to snoring due to the sedative effects of Nyquil. They have not noticed any postnasal drip. The patient's daughter has recently been ill and was prescribed antibiotics, suggesting a possible source of infection.  The patient is due to travel for work soon and is eager to resolve their symptoms. They have previously been treated with steroids for a similar condition, but express some concern about potential side effects due to a 'steroid cataract.' Despite this, they are open to receiving a steroid shot if necessary. They also express interest in trying a cough medicine without codeine, and are open to suggestions.      Social History   Tobacco Use  Smoking Status Never  Smokeless Tobacco Never    Current Outpatient Medications on File Prior to Visit  Medication Sig Dispense Refill   calcipotriene (DOVONOX) 0.005 % cream Apply to indicated areas twice a day. 60 g 1   clobetasol (TEMOVATE) 0.05 % external solution Apply 1 Application topically to psoriasis on scalp 2 (two) times daily as needed. 50 mL 1   clobetasol cream (TEMOVATE) 0.05 % Apply 1 Application topically to  rash 2 (two) times daily until clear. 45 g 1   Clobetasol Propionate 0.05 % shampoo Apply 1 Application topically 2 (two) times a week. Lather scalp for 5 minutes and rinse well. 118 mL 1   Cyanocobalamin (VITAMIN B-12) 1000 MCG SUBL Place 1,000 mcg under the tongue daily at 12 noon.     diazepam (VALIUM) 5 MG tablet Take 1 tablet (5 mg total) by mouth every 12 (twelve) hours as needed for anxiety. 30 tablet 1   famotidine (PEPCID) 20 MG tablet Take 20 mg by mouth as needed for heartburn or indigestion.     metroNIDAZOLE (METROGEL) 1 % gel Apply 1 Application topically to nose and cheeks Nightly. 60 g 1   No current facility-administered medications on file prior to visit.     ROS see history of present illness  Objective  Physical Exam Vitals:   05/23/23 0830  BP: 114/82  Pulse: 78  Temp: 98 F (36.7 C)  SpO2: 98%    BP Readings from Last 3 Encounters:  05/23/23 114/82  05/10/23 116/80  12/13/22 134/82   Wt Readings from Last 3 Encounters:  05/23/23 209 lb 6.4 oz (95 kg)  05/10/23 206 lb 9.6 oz (93.7 kg)  12/13/22 205 lb (93 kg)    Physical Exam Constitutional:      General: She is not in acute distress.    Appearance: Normal appearance.  HENT:     Head: Normocephalic.     Right Ear: Tympanic membrane normal.     Left Ear: Tympanic membrane normal.     Nose: Nose normal.  Mouth/Throat:     Mouth: Mucous membranes are moist.     Pharynx: Oropharynx is clear.  Eyes:     Conjunctiva/sclera: Conjunctivae normal.     Pupils: Pupils are equal, round, and reactive to light.  Cardiovascular:     Rate and Rhythm: Normal rate and regular rhythm.     Heart sounds: Normal heart sounds.  Pulmonary:     Effort: Pulmonary effort is normal.     Breath sounds: Normal breath sounds. No wheezing.  Lymphadenopathy:     Cervical: No cervical adenopathy.  Skin:    General: Skin is warm and dry.  Neurological:     General: No focal deficit present.     Mental Status: She  is alert.  Psychiatric:        Mood and Affect: Mood normal.        Behavior: Behavior normal.    Assessment/Plan: Please see individual problem list.  Bronchitis Assessment & Plan: They have experienced a persistent cough and chest congestion for 13 days without fever, shortness of breath, or sinus congestion, following possible exposure from their daughter who was recently placed on antibiotics. We will administer a steroid injection today and prescribe Doxycycline along with Bromfed DM for the cough, advising 5-10 ml as needed. Counseled on common side effects. They should maintain hydration and use over-the-counter analgesics as needed. She has an inhaler to use at home if needed. Should symptoms persist or worsen, they are advised to return for further evaluation.   Orders: -     Doxycycline Hyclate; Take 1 tablet (100 mg total) by mouth 2 (two) times daily.  Dispense: 14 tablet; Refill: 0 -     Pseudoeph-Bromphen-DM; Take 5-10 mLs by mouth every 6 (six) hours as needed.  Dispense: 120 mL; Refill: 0 -     dexAMETHasone Sodium Phosphate   Return if symptoms worsen or fail to improve.   Bethanie Dicker, NP-C [facility] - [employer]

## 2023-05-23 NOTE — Assessment & Plan Note (Addendum)
They have experienced a persistent cough and chest congestion for 13 days without fever, shortness of breath, or sinus congestion, following possible exposure from their daughter who was recently placed on antibiotics. We will administer a steroid injection today and prescribe Doxycycline along with Bromfed DM for the cough, advising 5-10 ml as needed. Counseled on common side effects. They should maintain hydration and use over-the-counter analgesics as needed. She has an inhaler to use at home if needed. Should symptoms persist or worsen, they are advised to return for further evaluation.

## 2023-05-28 ENCOUNTER — Encounter: Payer: Self-pay | Admitting: Nurse Practitioner

## 2023-05-29 ENCOUNTER — Other Ambulatory Visit: Payer: Self-pay

## 2023-05-30 ENCOUNTER — Other Ambulatory Visit: Payer: Self-pay

## 2023-05-30 ENCOUNTER — Ambulatory Visit: Payer: 59 | Admitting: Nurse Practitioner

## 2023-05-30 ENCOUNTER — Ambulatory Visit (INDEPENDENT_AMBULATORY_CARE_PROVIDER_SITE_OTHER): Payer: 59

## 2023-05-30 ENCOUNTER — Encounter: Payer: Self-pay | Admitting: Nurse Practitioner

## 2023-05-30 ENCOUNTER — Ambulatory Visit (INDEPENDENT_AMBULATORY_CARE_PROVIDER_SITE_OTHER): Payer: 59 | Admitting: Nurse Practitioner

## 2023-05-30 VITALS — BP 118/78 | HR 83 | Temp 98.2°F | Ht 67.0 in | Wt 209.0 lb

## 2023-05-30 DIAGNOSIS — R058 Other specified cough: Secondary | ICD-10-CM | POA: Diagnosis not present

## 2023-05-30 MED ORDER — ALBUTEROL SULFATE HFA 108 (90 BASE) MCG/ACT IN AERS
2.0000 | INHALATION_SPRAY | Freq: Four times a day (QID) | RESPIRATORY_TRACT | 2 refills | Status: DC | PRN
  Filled 2023-05-30: qty 6.7, 30d supply, fill #0

## 2023-05-30 MED ORDER — GUAIFENESIN ER 600 MG PO TB12
600.0000 mg | ORAL_TABLET | Freq: Two times a day (BID) | ORAL | 1 refills | Status: DC | PRN
  Filled 2023-05-30: qty 20, 10d supply, fill #0

## 2023-05-30 MED ORDER — AMOXICILLIN-POT CLAVULANATE 875-125 MG PO TABS
1.0000 | ORAL_TABLET | Freq: Two times a day (BID) | ORAL | 0 refills | Status: DC
  Filled 2023-05-30: qty 20, 10d supply, fill #0

## 2023-05-30 NOTE — Progress Notes (Signed)
Established Patient Office Visit  Subjective:  Patient ID: Alicia Blair, female    DOB: 1975/10/13  Age: 47 y.o. MRN: [id number]  CC:  Chief Complaint  Patient presents with   Acute Visit    Sick since last acute visit about 5 weeks ago Chest congestion, cough & little SOB    HPI  Alicia Blair presents for acute visit. She was seen on 12/5 and was started on doxycycline and pseudoeph- Bromphen and received dexamethasone injection while in the office.  She has completed the entire course of antibiotic but seems like her symptoms has not improved a lot.   She has congestion and SOB with exertion has been using the albuterol inhaler with improvement. Denise fever, chest pain.  HPI   Past Medical History:  Diagnosis Date   Anemia    with pregnancy   Asthma    as a child   Chicken pox    Complication of anesthesia    PONV (postoperative nausea and vomiting)     Past Surgical History:  Procedure Laterality Date   breast augmentation  2003   HERNIA REPAIR     LAPAROSCOPIC GASTRIC BANDING  2008   WISDOM TOOTH EXTRACTION      Family History  Problem Relation Age of Onset   Diabetes Mother    Cancer Mother 29       endometrial CA   Atrial fibrillation Mother    Hypertension Mother    Hyperlipidemia Mother    Diabetes Maternal Aunt    Diabetes Maternal Grandmother    Cancer Brother 21       testicular ca   Diabetes Brother     Social History   Socioeconomic History   Marital status: Married    Spouse name: Not on file   Number of children: Not on file   Years of education: Not on file   Highest education level: Not on file  Occupational History   Not on file  Tobacco Use   Smoking status: Never   Smokeless tobacco: Never  Vaping Use   Vaping status: Never Used  Substance and Sexual Activity   Alcohol use: Not Currently    Comment: social, none since pregnanc y   Drug use: No   Sexual activity: Yes    Birth control/protection: None  Other  Topics Concern   Not on file  Social History Narrative   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   Social Drivers of Corporate investment banker Strain: Not on file  Food Insecurity: Not on file  Transportation Needs: Not on file  Physical Activity: Not on file  Stress: Not on file  Social Connections: Not on file  Intimate Partner Violence: Not on file     Outpatient Medications Prior to Visit  Medication Sig Dispense Refill   brompheniramine-pseudoephedrine-DM 30-2-10 MG/5ML syrup Take 5-10 mLs by mouth every 6 (six) hours as needed. 120 mL 0   calcipotriene (DOVONOX) 0.005 % cream Apply to indicated areas twice a day. 60 g 1   clobetasol (TEMOVATE) 0.05 % external solution Apply 1 Application topically to psoriasis on scalp 2 (two) times daily as needed. 50 mL 1   clobetasol cream (TEMOVATE) 0.05 % Apply 1 Application topically to rash 2 (two) times daily until clear. 45 g 1   Clobetasol Propionate 0.05 % shampoo Apply 1 Application topically 2 (two) times a week. Lather scalp for 5 minutes and rinse well. 118 mL 1   Cyanocobalamin (VITAMIN  B-12) 1000 MCG SUBL Place 1,000 mcg under the tongue daily at 12 noon.     diazepam (VALIUM) 5 MG tablet Take 1 tablet (5 mg total) by mouth every 12 (twelve) hours as needed for anxiety. 30 tablet 1   famotidine (PEPCID) 20 MG tablet Take 20 mg by mouth as needed for heartburn or indigestion.     metroNIDAZOLE (METROGEL) 1 % gel Apply 1 Application topically to nose and cheeks Nightly. 60 g 1   doxycycline (VIBRA-TABS) 100 MG tablet Take 1 tablet (100 mg total) by mouth 2 (two) times daily. 14 tablet 0   No facility-administered medications prior to visit.    Allergies  Allergen Reactions   Codeine Nausea And Vomiting and Nausea Only   Erythromycin Nausea And Vomiting    ROS Review of Systems Negative unless indicated in HPI.    Objective:    Physical Exam Constitutional:      Appearance: Normal appearance.  HENT:     Right Ear: Tympanic  membrane normal. Tympanic membrane is not erythematous.     Left Ear: Tympanic membrane normal. Tympanic membrane is not erythematous.     Nose:     Right Turbinates: Not enlarged.     Left Turbinates: Not enlarged.     Right Sinus: No maxillary sinus tenderness or frontal sinus tenderness.     Left Sinus: No maxillary sinus tenderness or frontal sinus tenderness.     Mouth/Throat:     Mouth: Mucous membranes are moist.     Pharynx: No pharyngeal swelling, oropharyngeal exudate or posterior oropharyngeal erythema.     Tonsils: No tonsillar exudate.  Cardiovascular:     Rate and Rhythm: Normal rate and regular rhythm.  Pulmonary:     Effort: Pulmonary effort is normal.     Breath sounds: No stridor. Rales present. No rhonchi.  Neurological:     General: No focal deficit present.     Mental Status: She is alert and oriented to person, place, and time. Mental status is at baseline.  Psychiatric:        Mood and Affect: Mood normal.        Behavior: Behavior normal.        Thought Content: Thought content normal.        Judgment: Judgment normal.     BP 118/78   Pulse 83   Temp 98.2 F (36.8 C)   Ht 5\' 7"  (1.702 m)   Wt 209 lb (94.8 kg)   SpO2 98%   BMI 32.73 kg/m  Wt Readings from Last 3 Encounters:  05/30/23 209 lb (94.8 kg)  05/23/23 209 lb 6.4 oz (95 kg)  05/10/23 206 lb 9.6 oz (93.7 kg)     Health Maintenance  Topic Date Due   COVID-19 Vaccine (4 - 2024-25 season) 06/15/2023 (Originally 02/17/2023)   Fecal DNA (Cologuard)  05/16/2024   Cervical Cancer Screening (HPV/Pap Cotest)  03/07/2025   DTaP/Tdap/Td (3 - Td or Tdap) 05/04/2031   INFLUENZA VACCINE  Completed   Hepatitis C Screening  Completed   HIV Screening  Completed   HPV VACCINES  Aged Out    There are no preventive care reminders to display for this patient.  Lab Results  Component Value Date   TSH 3.29 05/10/2023   Lab Results  Component Value Date   WBC 5.0 05/10/2023   HGB 13.4 05/10/2023    HCT 41.3 05/10/2023   MCV 84.4 05/10/2023   PLT 242.0 05/10/2023   Lab Results  Component Value Date   NA 138 05/10/2023   K 4.1 05/10/2023   CO2 31 05/10/2023   GLUCOSE 104 (H) 05/10/2023   BUN 10 05/10/2023   CREATININE 0.67 05/10/2023   BILITOT 0.4 05/10/2023   ALKPHOS 43 05/10/2023   AST 22 05/10/2023   ALT 26 05/10/2023   PROT 7.2 05/10/2023   ALBUMIN 4.5 05/10/2023   CALCIUM 9.6 05/10/2023   ANIONGAP 9 05/14/2016   GFR 104.13 05/10/2023   Lab Results  Component Value Date   CHOL 216 (H) 05/10/2023   Lab Results  Component Value Date   HDL 55.10 05/10/2023   Lab Results  Component Value Date   LDLCALC 142 (H) 05/10/2023   Lab Results  Component Value Date   TRIG 97.0 05/10/2023   Lab Results  Component Value Date   CHOLHDL 4 05/10/2023   Lab Results  Component Value Date   HGBA1C 6.2 05/10/2023      Assessment & Plan:  Other cough Assessment & Plan: Vital signs stable, adventitious sounds during auscultation.  Discussed findings with the patient.  Symptoms present for more than  2-3 weeks with no sign of resolution, will perform chest x-ray to rule out pneumonia, will treat with Augmentin.  Take Mucinex over-the-counter.  Refilled albuterol. Increase fluid intake, rest. Will let us know if symptoms not improving.   Orders: -     Amoxicillin-Pot Clavulanate; Take 1 tablet by mouth 2 (two) times daily.  Dispense: 20 tablet; Refill: 0 -     guaiFENesin ER; Take 1 tablet (600 mg total) by mouth 2 (two) times daily as needed.  Dispense: 30 tablet; Refill: 1 -     DG Chest 2 View; Future -     Albuterol Sulfate HFA; Inhale 2 puffs into the lungs every 6 (six) hours as needed for wheezing or shortness of breath.  Dispense: 6.7 g; Refill: 2    Follow-up: No follow-ups on file.   Kara Dies, NP

## 2023-06-12 NOTE — Assessment & Plan Note (Signed)
Vital signs stable, adventitious sounds during auscultation.  Discussed findings with the patient.  Symptoms present for more than  2-3 weeks with no sign of resolution, will perform chest x-ray to rule out pneumonia, will treat with Augmentin.  Take Mucinex over-the-counter.  Refilled albuterol. Increase fluid intake, rest. Will let us know if symptoms not improving.

## 2023-06-17 ENCOUNTER — Other Ambulatory Visit: Payer: Self-pay | Admitting: Nurse Practitioner

## 2023-06-17 DIAGNOSIS — J40 Bronchitis, not specified as acute or chronic: Secondary | ICD-10-CM

## 2023-06-18 ENCOUNTER — Other Ambulatory Visit: Payer: Self-pay

## 2023-06-18 ENCOUNTER — Other Ambulatory Visit: Payer: Self-pay | Admitting: Nurse Practitioner

## 2023-06-18 DIAGNOSIS — J40 Bronchitis, not specified as acute or chronic: Secondary | ICD-10-CM

## 2023-06-18 MED FILL — Pseudoephed-Bromphen-DM Syrup 30-2-10 MG/5ML: ORAL | 3 days supply | Qty: 118 | Fill #0 | Status: CN

## 2023-07-02 ENCOUNTER — Other Ambulatory Visit: Payer: Self-pay

## 2023-07-10 ENCOUNTER — Ambulatory Visit
Admission: RE | Admit: 2023-07-10 | Discharge: 2023-07-10 | Disposition: A | Discharge status: Home / Self Care | Payer: 59 | Source: Ambulatory Visit | Attending: Obstetrics and Gynecology | Admitting: Obstetrics and Gynecology

## 2023-07-10 DIAGNOSIS — R921 Mammographic calcification found on diagnostic imaging of breast: Secondary | ICD-10-CM

## 2023-07-29 ENCOUNTER — Other Ambulatory Visit: Payer: Self-pay

## 2023-07-30 ENCOUNTER — Other Ambulatory Visit: Payer: Self-pay

## 2023-07-30 MED ORDER — TERCONAZOLE 0.8 % VA CREA
1.0000 | TOPICAL_CREAM | Freq: Every day | VAGINAL | 0 refills | Status: AC
  Filled 2023-07-30 – 2023-07-31 (×3): qty 20, 3d supply, fill #0

## 2023-07-31 ENCOUNTER — Other Ambulatory Visit: Payer: Self-pay

## 2023-07-31 MED FILL — Pseudoephed-Bromphen-DM Syrup 30-2-10 MG/5ML: ORAL | 3 days supply | Qty: 118 | Fill #0 | Status: AC

## 2023-08-01 ENCOUNTER — Other Ambulatory Visit: Payer: Self-pay

## 2023-08-21 ENCOUNTER — Other Ambulatory Visit: Payer: Self-pay

## 2023-08-21 MED ORDER — CLOBETASOL PROPIONATE 0.05 % EX LIQD
Freq: Two times a day (BID) | CUTANEOUS | 2 refills | Status: DC
Start: 2023-08-20 — End: 2024-02-21
  Filled 2023-08-21: qty 59, 30d supply, fill #0
  Filled 2024-01-23 – 2024-01-28 (×3): qty 118, 30d supply, fill #0

## 2023-08-21 MED ORDER — CLOBETASOL PROPIONATE 0.05 % EX SOLN
Freq: Two times a day (BID) | CUTANEOUS | 1 refills | Status: AC
  Filled 2023-08-21 – 2024-01-23 (×2): qty 50, 30d supply, fill #0
  Filled 2024-04-13: qty 50, 30d supply, fill #1

## 2023-08-21 MED ORDER — CLOBETASOL PROPIONATE 0.05 % EX CREA
TOPICAL_CREAM | Freq: Two times a day (BID) | CUTANEOUS | 1 refills | Status: DC
Start: 2023-08-20 — End: 2024-05-11
  Filled 2023-08-21: qty 15, 30d supply, fill #0
  Filled 2024-01-23: qty 60, 30d supply, fill #0

## 2023-09-02 ENCOUNTER — Other Ambulatory Visit: Payer: Self-pay

## 2024-01-23 ENCOUNTER — Other Ambulatory Visit: Payer: Self-pay

## 2024-01-23 ENCOUNTER — Encounter: Payer: Self-pay | Admitting: Primary Care

## 2024-01-23 ENCOUNTER — Ambulatory Visit (INDEPENDENT_AMBULATORY_CARE_PROVIDER_SITE_OTHER): Admitting: Primary Care

## 2024-01-23 ENCOUNTER — Ambulatory Visit: Payer: Self-pay | Admitting: Primary Care

## 2024-01-23 VITALS — BP 108/64 | HR 76 | Temp 98.2°F | Ht 67.0 in | Wt 212.0 lb

## 2024-01-23 DIAGNOSIS — J029 Acute pharyngitis, unspecified: Secondary | ICD-10-CM

## 2024-01-23 DIAGNOSIS — J069 Acute upper respiratory infection, unspecified: Secondary | ICD-10-CM

## 2024-01-23 LAB — POCT RAPID STREP A (OFFICE): Rapid Strep A Screen: NEGATIVE

## 2024-01-23 LAB — POC COVID19 BINAXNOW: SARS Coronavirus 2 Ag: NEGATIVE

## 2024-01-23 MED ORDER — PREDNISONE 20 MG PO TABS
ORAL_TABLET | ORAL | 0 refills | Status: DC
  Filled 2024-01-23: qty 10, 5d supply, fill #0

## 2024-01-23 NOTE — Assessment & Plan Note (Signed)
 HPI and exam today represent a viral etiology.  Rapid strep and COVID-19 test negative today.  We discussed conservative treatment such as adding Tylenol  and Flonase . Limit Sudafed to 5 consecutive days.  Start prednisone  20 mg tablets. Take 2 tablets by mouth once daily in the morning for 5 days.  She will update early next week if symptoms do not improve and/or progress.

## 2024-01-23 NOTE — Patient Instructions (Signed)
 Start prednisone  20 mg tablets. Take 2 tablets by mouth once daily in the morning for 5 days.  Continue Tylenol /Ibuprofen .  Stop Sudafed.  Please update me early next week if your symptoms have not improved and/or have progressed.   It was a pleasure meeting you!

## 2024-01-23 NOTE — Progress Notes (Signed)
 Subjective:    Patient ID: Alicia Blair, female    DOB: 05-04-1976, 48 y.o.   MRN: [id number]  Sore Throat  Associated symptoms include congestion and headaches. Pertinent negatives include no coughing or shortness of breath.    Alicia Blair is a very pleasant 48 y.o. female patient of Dr. Berkley with a history of asthma, bronchitis, iron  deficiency anemia who presents today to discuss URI symptoms.  Symptom onset 4 days ago with sore throat. She then developed sinus pressure and head pressure, post nasal drip.  She denies fevers, cough. She's taken 2 doses of Sudafed and resumed her Zyrtec without improvement.    Review of Systems  Constitutional:  Negative for chills, fatigue and fever.  HENT:  Positive for congestion, postnasal drip, rhinorrhea and sore throat.   Respiratory:  Negative for cough, chest tightness and shortness of breath.   Cardiovascular:  Negative for chest pain.  Neurological:  Positive for headaches.         Past Medical History:  Diagnosis Date   Anemia    with pregnancy   Asthma    as a child   Chicken pox    Complication of anesthesia    PONV (postoperative nausea and vomiting)     Social History   Socioeconomic History   Marital status: Married    Spouse name: Not on file   Number of children: Not on file   Years of education: Not on file   Highest education level: Not on file  Occupational History   Not on file  Tobacco Use   Smoking status: Never   Smokeless tobacco: Never  Vaping Use   Vaping status: Never Used  Substance and Sexual Activity   Alcohol use: Not Currently    Comment: social, none since pregnanc y   Drug use: No   Sexual activity: Yes    Birth control/protection: None  Other Topics Concern   Not on file  Social History Narrative   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 .   Social Drivers of Corporate investment banker Strain: Not on file  Food Insecurity: Not on file  Transportation Needs: Not on file  Physical  Activity: Not on file  Stress: Not on file  Social Connections: Not on file  Intimate Partner Violence: Not on file    Past Surgical History:  Procedure Laterality Date   breast augmentation  2003   HERNIA REPAIR     LAPAROSCOPIC GASTRIC BANDING  2008   WISDOM TOOTH EXTRACTION      Family History  Problem Relation Age of Onset   Diabetes Mother    Cancer Mother 65       endometrial CA   Atrial fibrillation Mother    Hypertension Mother    Hyperlipidemia Mother    Diabetes Maternal Aunt    Diabetes Maternal Grandmother    Cancer Brother 21       testicular ca   Diabetes Brother     Allergies  Allergen Reactions   Codeine Nausea And Vomiting and Nausea Only   Erythromycin Nausea And Vomiting    Current Outpatient Medications on File Prior to Visit  Medication Sig Dispense Refill   albuterol  (VENTOLIN  HFA) 108 (90 Base) MCG/ACT inhaler Inhale 2 puffs into the lungs every 6 (six) hours as needed for wheezing or shortness of breath. 6.7 g 2   clobetasol  (TEMOVATE ) 0.05 % external solution Apply 1 Application topically to psoriasis on scalp 2 (two) times daily  as needed. 50 mL 1   clobetasol  cream (TEMOVATE ) 0.05 % Apply 1 Application topically to rash 2 (two) times daily until clear. 45 g 1   clobetasol  cream (TEMOVATE ) 0.05 % Apply twice daily to rash until clear 60 g 1   Clobetasol  Propionate (TEMOVATE ) 0.05 % external spray Use Twice daily as needed. 118 mL 2   Clobetasol  Propionate 0.05 % shampoo Apply 1 Application topically 2 (two) times a week. Lather scalp for 5 minutes and rinse well. 118 mL 1   famotidine (PEPCID) 20 MG tablet Take 20 mg by mouth as needed for heartburn or indigestion.     metroNIDAZOLE  (METROGEL ) 1 % gel Apply 1 Application topically to nose and cheeks Nightly. 60 g 1   OTEZLA 30 MG TABS Take 1 tablet by mouth 2 (two) times daily.     amoxicillin -clavulanate (AUGMENTIN ) 875-125 MG tablet Take 1 tablet by mouth 2 (two) times daily. (Patient not  taking: Reported on 01/23/2024) 20 tablet 0   brompheniramine-pseudoephedrine-DM 30-2-10 MG/5ML syrup Take 5-10 mLs by mouth every 6 (six) hours as needed. (Patient not taking: Reported on 01/23/2024) 118 mL 0   calcipotriene  (DOVONOX) 0.005 % cream Apply to indicated areas twice a day. (Patient not taking: Reported on 01/23/2024) 60 g 1   clobetasol  (TEMOVATE ) 0.05 % external solution Apply twice daily to psoriasis on scalp as needed. 50 mL 1   Cyanocobalamin  (VITAMIN B-12) 1000 MCG SUBL Place 1,000 mcg under the tongue daily at 12 noon. (Patient not taking: Reported on 01/23/2024)     diazepam  (VALIUM ) 5 MG tablet Take 1 tablet (5 mg total) by mouth every 12 (twelve) hours as needed for anxiety. (Patient not taking: Reported on 01/23/2024) 30 tablet 1   guaiFENesin  (MUCINEX ) 600 MG 12 hr tablet Take 1 tablet (600 mg total) by mouth 2 (two) times daily as needed. (Patient not taking: Reported on 01/23/2024) 30 tablet 1   No current facility-administered medications on file prior to visit.    BP 108/64   Pulse 76   Temp 98.2 F (36.8 C) (Temporal)   Ht 5' 7 (1.702 m)   Wt 212 lb (96.2 kg)   LMP  (Exact Date)   SpO2 98%   BMI 33.20 kg/m  Objective:   Physical Exam Constitutional:      Appearance: She is not ill-appearing.  HENT:     Right Ear: Tympanic membrane and ear canal normal.     Left Ear: Tympanic membrane and ear canal normal.     Nose: No mucosal edema.     Right Sinus: No maxillary sinus tenderness or frontal sinus tenderness.     Left Sinus: No maxillary sinus tenderness or frontal sinus tenderness.     Mouth/Throat:     Mouth: Mucous membranes are moist.  Eyes:     Conjunctiva/sclera: Conjunctivae normal.  Cardiovascular:     Rate and Rhythm: Normal rate and regular rhythm.  Pulmonary:     Effort: Pulmonary effort is normal.     Breath sounds: Normal breath sounds. No wheezing or rhonchi.  Musculoskeletal:     Cervical back: Neck supple.  Skin:    General: Skin is warm and  dry.           Assessment & Plan:  Viral URI Assessment & Plan: HPI and exam today represent a viral etiology.  Rapid strep and COVID-19 test negative today.  We discussed conservative treatment such as adding Tylenol  and Flonase . Limit Sudafed to 5 consecutive days.  Start prednisone  20 mg tablets. Take 2 tablets by mouth once daily in the morning for 5 days.  She will update early next week if symptoms do not improve and/or progress.  Orders: -     predniSONE ; Take 2 tablets by mouth once daily in the morning for 5 days.  Dispense: 10 tablet; Refill: 0  Sore throat -     POC COVID-19 BinaxNow -     POCT rapid strep A        Comer MARLA Gaskins, NP

## 2024-01-24 ENCOUNTER — Other Ambulatory Visit: Payer: Self-pay

## 2024-01-28 ENCOUNTER — Other Ambulatory Visit: Payer: Self-pay

## 2024-01-28 ENCOUNTER — Other Ambulatory Visit (HOSPITAL_COMMUNITY): Payer: Self-pay

## 2024-02-07 ENCOUNTER — Other Ambulatory Visit: Payer: Self-pay

## 2024-02-21 ENCOUNTER — Ambulatory Visit (INDEPENDENT_AMBULATORY_CARE_PROVIDER_SITE_OTHER): Admitting: Nurse Practitioner

## 2024-02-21 ENCOUNTER — Ambulatory Visit
Admission: RE | Admit: 2024-02-21 | Discharge: 2024-02-21 | Disposition: A | Discharge status: Home / Self Care | Source: Ambulatory Visit | Attending: Nurse Practitioner | Admitting: Nurse Practitioner

## 2024-02-21 ENCOUNTER — Encounter: Payer: Self-pay | Admitting: Nurse Practitioner

## 2024-02-21 ENCOUNTER — Ambulatory Visit: Payer: Self-pay | Admitting: Nurse Practitioner

## 2024-02-21 VITALS — BP 118/84 | HR 77 | Temp 98.0°F | Resp 20 | Ht 67.0 in | Wt 202.4 lb

## 2024-02-21 DIAGNOSIS — R1030 Lower abdominal pain, unspecified: Secondary | ICD-10-CM | POA: Insufficient documentation

## 2024-02-21 LAB — POCT URINALYSIS DIPSTICK
Bilirubin, UA: NEGATIVE
Glucose, UA: NEGATIVE
Ketones, UA: NEGATIVE
Leukocytes, UA: NEGATIVE
Nitrite, UA: NEGATIVE
Protein, UA: NEGATIVE
Spec Grav, UA: 1.01 (ref 1.010–1.025)
Urobilinogen, UA: 0.2 U/dL
pH, UA: 6 (ref 5.0–8.0)

## 2024-02-21 MED ORDER — IOHEXOL 300 MG/ML  SOLN
100.0000 mL | Freq: Once | INTRAMUSCULAR | Status: AC | PRN
  Administered 2024-02-21: 100 mL via INTRAVENOUS

## 2024-02-21 NOTE — Addendum Note (Signed)
 Addended by: Najir Roop on: 02/21/2024 01:38 PM   Modules accepted: Orders

## 2024-02-21 NOTE — Progress Notes (Signed)
 Leron Glance, NP-C Phone: [phone]  Alicia Blair is a 48 y.o. female who presents today for abdominal pain.   Discussed the use of AI scribe software for clinical note transcription with the patient, who gave verbal consent to proceed.  History of Present Illness   Alicia Blair is a 48 year old female who presents with abdominal pain and bloating.  She began experiencing abdominal discomfort on Tuesday night, describing it as a sensation of trapped gas. Despite normal bowel movements, the discomfort persisted into Wednesday, accompanied by bloating and a sensation of gas. She took a laxative on Wednesday night, which provided some relief by Thursday morning, but she remains tender to touch in the lower abdominal area.  The discomfort is primarily located in the lower abdomen, described as 'pelvic' and 'colon area.' No nausea, vomiting, or changes in bowel movements, which remain regular due to her use of Otezla. She has been taking simethicone  for gas relief, using both liquid and tablet forms approximately four times.  On Wednesday, she stopped eating solid foods and switched to a liquid diet for 24 hours, resuming normal eating on Thursday without recurrence of severe symptoms. She reports passing gas and having normal urination without burning or frequency. No back pain, fever, or chills.  Her medical history includes a uterine ablation performed last year, and she notes that she is still ovulating. She recently started taking ashwagandha but discontinued it on Wednesday.      Social History   Tobacco Use  Smoking Status Never  Smokeless Tobacco Never    Current Outpatient Medications on File Prior to Visit  Medication Sig Dispense Refill   calcipotriene  (DOVONOX) 0.005 % cream Apply to indicated areas twice a day. 60 g 1   clobetasol  (TEMOVATE ) 0.05 % external solution Apply 1 Application topically to psoriasis on scalp 2 (two) times daily as needed. 50 mL 1   clobetasol   (TEMOVATE ) 0.05 % external solution Apply twice daily to psoriasis on scalp as needed. 50 mL 1   clobetasol  cream (TEMOVATE ) 0.05 % Apply 1 Application topically to rash 2 (two) times daily until clear. 45 g 1   clobetasol  cream (TEMOVATE ) 0.05 % Apply twice daily to rash until clear 60 g 1   Clobetasol  Propionate 0.05 % shampoo Apply 1 Application topically 2 (two) times a week. Lather scalp for 5 minutes and rinse well. 118 mL 1   Cyanocobalamin  (VITAMIN B-12) 1000 MCG SUBL Place 1,000 mcg under the tongue daily at 12 noon.     famotidine (PEPCID) 20 MG tablet Take 20 mg by mouth as needed for heartburn or indigestion.     metroNIDAZOLE  (METROGEL ) 1 % gel Apply 1 Application topically to nose and cheeks Nightly. 60 g 1   OTEZLA 30 MG TABS Take 1 tablet by mouth 2 (two) times daily.     No current facility-administered medications on file prior to visit.     ROS see history of present illness  Objective  Physical Exam Vitals:   02/21/24 1103  BP: 118/84  Pulse: 77  Resp: 20  Temp: 98 F (36.7 C)  SpO2: 97%    BP Readings from Last 3 Encounters:  02/21/24 118/84  01/23/24 108/64  05/30/23 118/78   Wt Readings from Last 3 Encounters:  02/21/24 202 lb 6 oz (91.8 kg)  01/23/24 212 lb (96.2 kg)  05/30/23 209 lb (94.8 kg)    Physical Exam Constitutional:      General: She is not in acute  distress.    Appearance: Normal appearance.  HENT:     Head: Normocephalic.  Cardiovascular:     Rate and Rhythm: Normal rate and regular rhythm.     Heart sounds: Normal heart sounds.  Pulmonary:     Effort: Pulmonary effort is normal.     Breath sounds: Normal breath sounds.  Abdominal:     Tenderness: There is generalized abdominal tenderness and tenderness in the suprapubic area.  Skin:    General: Skin is warm and dry.  Neurological:     General: No focal deficit present.     Mental Status: She is alert.  Psychiatric:        Mood and Affect: Mood normal.        Behavior:  Behavior normal.      Assessment/Plan: Please see individual problem list.  Lower abdominal pain Assessment & Plan: Acute lower abdominal pain with potential gastrointestinal or gynecological causes. Patient very tender on palpation in lower/suprapubic area. Pain with standing and sitting. Order STAT CT scan of the abdomen and pelvis for further evaluation. Further work up pending results.   Orders: -     CT ABDOMEN PELVIS W CONTRAST; Future     No follow-ups on file.   Leron Glance, NP-C [facility]

## 2024-02-21 NOTE — Assessment & Plan Note (Signed)
 Acute lower abdominal pain with potential gastrointestinal or gynecological causes. Patient very tender on palpation in lower/suprapubic area. Pain with standing and sitting. Order STAT CT scan of the abdomen and pelvis for further evaluation. Further work up pending results.

## 2024-02-23 LAB — URINE CULTURE
MICRO NUMBER:: 16929098
Result:: NO GROWTH
SPECIMEN QUALITY:: ADEQUATE

## 2024-02-25 NOTE — Progress Notes (Unsigned)
 Chief Complaint: No chief complaint on file.   History of Present Illness:  Alicia Blair is a 48 y.o. female who is seen in consultation from Alicia Verneita CROME, MD for evaluation of CT evidence of bladder wall thickening.  Her history began about 8 days ago with sudden onset lower abdominal pain.  Pain lasted about 3 days.  Not associated with change in urinary or bowel pattern.  She did have peritoneal signs with worsening of her pain with motion.  She did not have nausea or vomiting.  She was sent for CT abdomen and pelvis.  This revealed mild circumferential bladder wall thickening, query cystitis.  No other acute findings.  Her pain suddenly stopped.  She has had no issues for the past few days.  There is no history of GYN or GI disorders.  No prior history of kidney stones.   Past Medical History:  Past Medical History:  Diagnosis Date   Anemia    with pregnancy   Asthma    as a child   Chicken pox    Complication of anesthesia    PONV (postoperative nausea and vomiting)     Past Surgical History:  Past Surgical History:  Procedure Laterality Date   breast augmentation  2003   HERNIA REPAIR     LAPAROSCOPIC GASTRIC BANDING  2008   WISDOM TOOTH EXTRACTION      Allergies:  Allergies  Allergen Reactions   Codeine Nausea And Vomiting and Nausea Only   Erythromycin Nausea And Vomiting    Family History:  Family History  Problem Relation Age of Onset   Diabetes Mother    Cancer Mother 49       endometrial CA   Atrial fibrillation Mother    Hypertension Mother    Hyperlipidemia Mother    Diabetes Maternal Aunt    Diabetes Maternal Grandmother    Cancer Brother 73       testicular ca   Diabetes Brother     Social History:  Social History   Tobacco Use   Smoking status: Never   Smokeless tobacco: Never  Vaping Use   Vaping status: Never Used  Substance Use Topics   Alcohol use: Not Currently    Comment: social, none since pregnanc y   Drug use:  No    Review of symptoms:  Constitutional:  Negative for unexplained weight loss, night sweats, fever, chills ENT:  Negative for nose bleeds, sinus pain, painful swallowing CV:  Negative for chest pain, shortness of breath, exercise intolerance, palpitations, loss of consciousness Resp:  Negative for cough, wheezing, shortness of breath GI:  Negative for nausea, vomiting, diarrhea, bloody stools GU:  Positives noted in HPI; otherwise negative for gross hematuria, dysuria, urinary incontinence Neuro:  Negative for seizures, poor balance, limb weakness, slurred speech Psych:  Negative for lack of energy, depression, anxiety Endocrine:  Negative for polydipsia, polyuria, symptoms of hypoglycemia (dizziness, hunger, sweating) Hematologic:  Negative for anemia, purpura, petechia, prolonged or excessive bleeding, use of anticoagulants  Allergic:  Negative for difficulty breathing or choking as a result of exposure to anything; no shellfish allergy; no allergic response (rash/itch) to materials, foods  Physical exam: There were no vitals taken for this visit. GENERAL APPEARANCE:  Well appearing, well developed, well nourished, NAD HEENT: Atraumatic, Normocephalic. NECK: Normal appearance LUNGS: Normal inspiratory and expiratory excursion HEART: Regular Rate ABDOMEN: No rebound or guarding.   EXTREMITIES: Moves all extremities well.  Without clubbing, cyanosis.  Mild/1+ pedal edema  bilaterally NEUROLOGIC:  Alert and oriented x 3, normal gait, CN II-XII grossly intact.  MENTAL STATUS:  Appropriate. SKIN:  Warm, dry and intact.    Results: Prior PCP records reviewed  CT images reviewed with the patient.  I feel the bladder appears normal in and under distended state.  CT reviewed with the radiologist  Trace blood on urinalysis/dipstick.  Microscopic clear  Assessment: 1.  Abdominal pain-doubt of GU etiology, resolved.  2.  Abnormal appearance of bladder on CT-I think that this is a  normal appearance of an under distended bladder.  Her urinalysis is clear  3.  Abnormal appearance of air in uterus-perhaps secondary to procedure last November   Plan: 1.  I did speak with the radiologist about the findings in the uterus-he was unsure of the etiology of this.  Perhaps from her thermal ablation  2.  I think it is worthwhile for Alicia Blair to see Dr. Latisha, or her gynecologist for check  3.  From a urologic standpoint, no further evaluation needed

## 2024-02-26 ENCOUNTER — Encounter: Payer: Self-pay | Admitting: Urology

## 2024-02-26 ENCOUNTER — Ambulatory Visit (INDEPENDENT_AMBULATORY_CARE_PROVIDER_SITE_OTHER): Admitting: Urology

## 2024-02-26 VITALS — BP 121/82 | HR 78 | Ht 67.0 in | Wt 204.0 lb

## 2024-02-26 DIAGNOSIS — Q6479 Other congenital malformations of bladder and urethra: Secondary | ICD-10-CM

## 2024-02-26 DIAGNOSIS — N3289 Other specified disorders of bladder: Secondary | ICD-10-CM

## 2024-02-26 LAB — MICROSCOPIC EXAMINATION

## 2024-02-26 LAB — URINALYSIS, ROUTINE W REFLEX MICROSCOPIC
Bilirubin, UA: NEGATIVE
Glucose, UA: NEGATIVE
Ketones, UA: NEGATIVE
Leukocytes,UA: NEGATIVE
Nitrite, UA: NEGATIVE
Protein,UA: NEGATIVE
Specific Gravity, UA: 1.01 (ref 1.005–1.030)
Urobilinogen, Ur: 0.2 mg/dL (ref 0.2–1.0)
pH, UA: 7 (ref 5.0–7.5)

## 2024-04-13 ENCOUNTER — Other Ambulatory Visit: Payer: Self-pay

## 2024-04-13 MED ORDER — CLOBETASOL PROPIONATE 0.05 % EX SHAM
MEDICATED_SHAMPOO | CUTANEOUS | 1 refills | Status: DC
  Filled 2024-04-13 – 2024-04-30 (×3): qty 118, 30d supply, fill #0

## 2024-04-14 ENCOUNTER — Other Ambulatory Visit: Payer: Self-pay

## 2024-04-15 ENCOUNTER — Other Ambulatory Visit: Payer: Self-pay

## 2024-04-25 ENCOUNTER — Other Ambulatory Visit: Payer: Self-pay

## 2024-04-30 ENCOUNTER — Other Ambulatory Visit: Payer: Self-pay

## 2024-05-11 ENCOUNTER — Encounter: Payer: Self-pay | Admitting: Internal Medicine

## 2024-05-11 ENCOUNTER — Ambulatory Visit: Admitting: Internal Medicine

## 2024-05-11 VITALS — BP 130/82 | HR 82 | Ht 67.0 in | Wt 206.8 lb

## 2024-05-11 DIAGNOSIS — R1030 Lower abdominal pain, unspecified: Secondary | ICD-10-CM

## 2024-05-11 DIAGNOSIS — Z1211 Encounter for screening for malignant neoplasm of colon: Secondary | ICD-10-CM

## 2024-05-11 DIAGNOSIS — D509 Iron deficiency anemia, unspecified: Secondary | ICD-10-CM

## 2024-05-11 DIAGNOSIS — E785 Hyperlipidemia, unspecified: Secondary | ICD-10-CM | POA: Diagnosis not present

## 2024-05-11 DIAGNOSIS — K219 Gastro-esophageal reflux disease without esophagitis: Secondary | ICD-10-CM

## 2024-05-11 DIAGNOSIS — L409 Psoriasis, unspecified: Secondary | ICD-10-CM | POA: Diagnosis not present

## 2024-05-11 DIAGNOSIS — R5383 Other fatigue: Secondary | ICD-10-CM | POA: Diagnosis not present

## 2024-05-11 DIAGNOSIS — Z23 Encounter for immunization: Secondary | ICD-10-CM | POA: Diagnosis not present

## 2024-05-11 DIAGNOSIS — E66811 Obesity, class 1: Secondary | ICD-10-CM

## 2024-05-11 DIAGNOSIS — R7303 Prediabetes: Secondary | ICD-10-CM

## 2024-05-11 DIAGNOSIS — M6289 Other specified disorders of muscle: Secondary | ICD-10-CM

## 2024-05-11 DIAGNOSIS — Z1231 Encounter for screening mammogram for malignant neoplasm of breast: Secondary | ICD-10-CM

## 2024-05-11 DIAGNOSIS — R1024 Suprapubic pain: Secondary | ICD-10-CM

## 2024-05-11 DIAGNOSIS — Z0000 Encounter for general adult medical examination without abnormal findings: Secondary | ICD-10-CM

## 2024-05-11 MED ORDER — FLUTICASONE PROPIONATE 50 MCG/ACT NA SUSP: 2.0000 | Freq: Every day | NASAL | 6 refills | Status: AC

## 2024-05-11 NOTE — Patient Instructions (Addendum)
  I appreciate your concern about continuing your PPI in light of the recently published studies suggesting an association with increased risk of dementia and kidney failure.  I advise you to try  using famotidine  20 mg once or twice daily, and use omeprazole  if symptoms return,  for a  week or so.    You may call the office to schedule a same day lab appt to submit a urine sample in the container provided if it recurs

## 2024-05-11 NOTE — Assessment & Plan Note (Signed)
 10 yr risk assessment for cardiac events  using the AHA risk calculator is<1% , and there is no evidence of atherosclerosis by prior imaging.  No treatment required.

## 2024-05-11 NOTE — Progress Notes (Unsigned)
 Patient ID: Alicia Blair, female    DOB: March 29, 1976  Age: 48 y.o. MRN: [id number]  The patient is here for annual preventive examination and management of other chronic and acute problems.   The risk factors are reflected in the social history.   The roster of all physicians providing medical care to patient - is listed in the Snapshot section of the chart.   Activities of daily living:  The patient is 100% independent in all ADLs: dressing, toileting, feeding as well as independent mobility   Home safety : The patient has smoke detectors in the home. They wear seatbelts.  There are no unsecured firearms at home. There is no violence in the home.    There is no risks for hepatitis, STDs or HIV. There is no   history of blood transfusion. They have no travel history to infectious disease endemic areas of the world.   The patient has seen their dentist in the last six month. They have seen their eye doctor in the last year. The patinet  denies slight hearing difficulty with regard to whispered voices and some television programs.  They have deferred audiologic testing in the last year.  They do not  have excessive sun exposure. Discussed the need for sun protection: hats, long sleeves and use of sunscreen if there is significant sun exposure.    Diet: the importance of a healthy diet is discussed. They do have a healthy diet.   The benefits of regular aerobic exercise were discussed. The patient  exercises  3 to 5 days per week  for  60 minutes.    Depression screen: there are no signs or vegative symptoms of depression- irritability, change in appetite, anhedonia, sadness/tearfullness.   The following portions of the patient's history were reviewed and updated as appropriate: allergies, current medications, past family history, past medical history,  past surgical history, past social history  and problem list.   Visual acuity was not assessed per patient preference since the patient has  regular follow up with an  ophthalmologist. Hearing and body mass index were assessed and reviewed.    During the course of the visit the patient was educated and counseled about appropriate screening and preventive services including : fall prevention , diabetes screening, nutrition counseling, colorectal cancer screening, and recommended immunizations.    Chief Complaint:  History of severe  lower abd pain lasting 2 days,  bilateral saw kacey in Sept  abd ct ordered  1)  [facility] UROLOGY  :  no bladder wall thickening  2)  incidental air in uterus  :found by urology,,   h/o ablation in Nov 2024 , advised to see GYN  who examined her and exam was normal.  No recurrence . Sees gyn next month for PAP   3) obesity: h/o lap band,  removed in 2022   4) soc: working for [employer] from home . 8 hours daily on laptop . 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    Physical Exam:  BP  130/82   Pulse 82   Ht 5' 7 (1.702 m)   Wt 206 lb 12.8 oz (93.8 kg)   SpO2 98%   BMI 32.39 kg/m    Physical Exam  Assessment and Plan: Iron  deficiency anemia, unspecified iron  deficiency anemia type  Prediabetes  Other fatigue  Hyperlipidemia, unspecified hyperlipidemia type  Encounter for preventative adult health care examination  Encounter for screening mammogram for malignant neoplasm of breast    No follow-ups on file.  Verneita LITTIE Kettering,  MD

## 2024-05-11 NOTE — Assessment & Plan Note (Signed)
 Has resumed otesla due to recurrence of patches  not confined to scalp anymore.  Using clobetasol  .

## 2024-05-12 NOTE — Assessment & Plan Note (Signed)
 Etiology unclear.  Workup reviewed  including CT  abd pelvis, GYN and urology evaluation. Resolved

## 2024-05-12 NOTE — Assessment & Plan Note (Signed)
 Resolved with endometrial ablation   Lab Results  Component Value Date   WBC 5.0 05/10/2023   HGB 13.4 05/10/2023   HCT 41.3 05/10/2023   MCV 84.4 05/10/2023   PLT 242.0 05/10/2023

## 2024-05-12 NOTE — Assessment & Plan Note (Signed)

## 2024-05-12 NOTE — Assessment & Plan Note (Signed)
 With prediabetes by current A1c .  I have encouraged her  to reduce her   BMI by 10% over the next 6 months using a low glycemic index diet and regular exercise a minimum of 5 days per week.    Lab Results  Component Value Date   HGBA1C 6.2 05/10/2023

## 2024-05-12 NOTE — Assessment & Plan Note (Signed)
 I have addressed  A1c aand recommended wt loss of 10% of body weight over the next 6 months using a low fat, fruit/vegetable based [place] diet and regular exercise a minimum of 5 days per week.    Lab Results  Component Value Date   HGBA1C 6.2 05/10/2023

## 2024-05-13 ENCOUNTER — Telehealth: Payer: Self-pay

## 2024-05-13 NOTE — Telephone Encounter (Signed)
 Copied from CRM #[id number]. Topic: Clinical - Lab/Test Results >> May 13, 2024  2:34 PM Tysheama G wrote: Reason for CRM: Patient is requesting proof of her flu shot that she got so she can show proof to her employer. Can you call her to let know if you do it or not. Callback number [phone]

## 2024-05-13 NOTE — Telephone Encounter (Signed)
 LMTCB. Please pt know that we have her proof of vaccination ready for pick up.    Placed up front in accordion folder.

## 2024-06-03 ENCOUNTER — Other Ambulatory Visit: Payer: Self-pay

## 2024-06-03 MED ORDER — TERCONAZOLE 0.8 % VA CREA
1.0000 | TOPICAL_CREAM | Freq: Every evening | VAGINAL | 2 refills | Status: AC
  Filled 2024-06-03: qty 20, 3d supply, fill #0

## 2024-06-22 ENCOUNTER — Other Ambulatory Visit: Payer: Self-pay | Admitting: Internal Medicine

## 2024-06-22 DIAGNOSIS — Z1231 Encounter for screening mammogram for malignant neoplasm of breast: Secondary | ICD-10-CM

## 2024-07-13 ENCOUNTER — Ambulatory Visit

## 2024-07-14 ENCOUNTER — Ambulatory Visit

## 2024-07-31 ENCOUNTER — Ambulatory Visit

## 2025-05-17 ENCOUNTER — Encounter: Admitting: Internal Medicine
